# Patient Record
Sex: Female | Born: 1945
Health system: Southern US, Community
[De-identification: ages and names within clinical notes are randomized; demographics above are authoritative.]

## PROBLEM LIST (undated history)

## (undated) ENCOUNTER — Emergency Department (HOSPITAL_COMMUNITY): Discharge status: Absent without leave | Payer: PPO

## (undated) DIAGNOSIS — K759 Inflammatory liver disease, unspecified: Secondary | ICD-10-CM

## (undated) DIAGNOSIS — K219 Gastro-esophageal reflux disease without esophagitis: Secondary | ICD-10-CM

## (undated) DIAGNOSIS — M1990 Unspecified osteoarthritis, unspecified site: Secondary | ICD-10-CM

## (undated) DIAGNOSIS — R569 Unspecified convulsions: Secondary | ICD-10-CM

## (undated) DIAGNOSIS — F32A Depression, unspecified: Secondary | ICD-10-CM

## (undated) DIAGNOSIS — B1910 Unspecified viral hepatitis B without hepatic coma: Secondary | ICD-10-CM

## (undated) DIAGNOSIS — C801 Malignant (primary) neoplasm, unspecified: Secondary | ICD-10-CM

## (undated) DIAGNOSIS — F329 Major depressive disorder, single episode, unspecified: Secondary | ICD-10-CM

## (undated) HISTORY — DX: Unspecified osteoarthritis, unspecified site: M19.90

## (undated) HISTORY — DX: Unspecified viral hepatitis B without hepatic coma: B19.10

## (undated) HISTORY — PX: TUBAL LIGATION: SHX77

## (undated) HISTORY — PX: APPENDECTOMY: SHX54

## (undated) HISTORY — DX: Malignant (primary) neoplasm, unspecified: C80.1

---
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
## 1995-02-01 DIAGNOSIS — B1910 Unspecified viral hepatitis B without hepatic coma: Secondary | ICD-10-CM

## 1995-02-01 DIAGNOSIS — M1990 Unspecified osteoarthritis, unspecified site: Secondary | ICD-10-CM

## 1995-02-01 HISTORY — DX: Unspecified osteoarthritis, unspecified site: M19.90

## 1995-02-01 HISTORY — DX: Unspecified viral hepatitis B without hepatic coma: B19.10

## 1998-08-31 ENCOUNTER — Emergency Department (HOSPITAL_COMMUNITY): Admission: EM | Admit: 1998-08-31 | Discharge: 1998-08-31 | Payer: Self-pay | Admitting: Emergency Medicine

## 1998-09-11 ENCOUNTER — Encounter: Admission: RE | Admit: 1998-09-11 | Discharge: 1998-09-11 | Payer: Self-pay | Admitting: *Deleted

## 2001-07-16 ENCOUNTER — Emergency Department (HOSPITAL_COMMUNITY): Admission: EM | Admit: 2001-07-16 | Discharge: 2001-07-16 | Payer: Self-pay | Admitting: Emergency Medicine

## 2001-07-21 ENCOUNTER — Emergency Department (HOSPITAL_COMMUNITY): Admission: EM | Admit: 2001-07-21 | Discharge: 2001-07-21 | Payer: Self-pay | Admitting: Emergency Medicine

## 2002-01-13 ENCOUNTER — Inpatient Hospital Stay (HOSPITAL_COMMUNITY): Admission: EM | Admit: 2002-01-13 | Discharge: 2002-01-18 | Payer: Self-pay | Admitting: Psychiatry

## 2002-03-30 ENCOUNTER — Emergency Department (HOSPITAL_COMMUNITY): Admission: EM | Admit: 2002-03-30 | Discharge: 2002-03-30 | Payer: Self-pay

## 2003-01-15 ENCOUNTER — Ambulatory Visit (HOSPITAL_COMMUNITY): Admission: RE | Admit: 2003-01-15 | Discharge: 2003-01-15 | Payer: Self-pay

## 2004-02-16 ENCOUNTER — Ambulatory Visit: Payer: Self-pay | Admitting: Gastroenterology

## 2004-03-02 ENCOUNTER — Ambulatory Visit (HOSPITAL_COMMUNITY): Admission: RE | Admit: 2004-03-02 | Discharge: 2004-03-02 | Payer: Self-pay | Admitting: Gastroenterology

## 2004-10-22 ENCOUNTER — Ambulatory Visit: Payer: Self-pay | Admitting: Gastroenterology

## 2005-09-09 ENCOUNTER — Ambulatory Visit: Payer: Self-pay | Admitting: Gastroenterology

## 2006-07-27 ENCOUNTER — Ambulatory Visit: Payer: Self-pay | Admitting: Gastroenterology

## 2006-08-02 ENCOUNTER — Ambulatory Visit (HOSPITAL_COMMUNITY): Admission: RE | Admit: 2006-08-02 | Discharge: 2006-08-02 | Payer: Self-pay | Admitting: Emergency Medicine

## 2006-08-31 ENCOUNTER — Ambulatory Visit (HOSPITAL_COMMUNITY): Admission: RE | Admit: 2006-08-31 | Discharge: 2006-08-31 | Payer: Self-pay | Admitting: Gastroenterology

## 2006-08-31 ENCOUNTER — Encounter (INDEPENDENT_AMBULATORY_CARE_PROVIDER_SITE_OTHER): Payer: Self-pay | Admitting: Interventional Radiology

## 2007-01-11 ENCOUNTER — Ambulatory Visit: Payer: Self-pay | Admitting: Gastroenterology

## 2007-01-16 ENCOUNTER — Ambulatory Visit (HOSPITAL_COMMUNITY): Admission: RE | Admit: 2007-01-16 | Discharge: 2007-01-16 | Payer: Self-pay | Admitting: Gastroenterology

## 2007-07-26 ENCOUNTER — Ambulatory Visit: Payer: Self-pay | Admitting: Gastroenterology

## 2010-06-18 NOTE — Discharge Summary (Signed)
NAME:  Brooke Huber, Brooke Huber                     ACCOUNT NO.:  [id number]   MEDICAL RECORD NO.:  [id number]                   PATIENT TYPE:  IPS   LOCATION:  0406                                 FACILITY:  [facility]   PHYSICIAN:  Jeanice Lim, M.D.              DATE OF BIRTH:  13-May-1945   DATE OF ADMISSION:  01/13/2002  DATE OF DISCHARGE:  01/18/2002                                 DISCHARGE SUMMARY   IDENTIFYING DATA:  This is a 65 year old, divorced, Caucasian female  voluntarily admitted, presenting to the [facility], off her medications.  Having a history of schizophrenia, believing  that her husband was the devil and had been keeping a diary with references  to suicide.  Was hearing Jesus talking to her.  Psychotic at the time of  admission.   MEDICATIONS:  1. Prempro.  2. Calcium supplement.  3. Effexor XR 75 mg q. a.m.  4. BuSpar.  5. Likely Wellbutrin 100 mg q. a.m. and two tablets q. 12 p.m.  6. Haldol 1 mg q. h.s.   ALLERGIES:  No known drug allergies.   PHYSICAL EXAMINATION:  Essentially within normal limits.  Neurologically  nonfocal.   ROUTINE ADMISSION LABORATORIES:  WBC mildly elevated at 10.7, otherwise,  essentially within normal.  Low potassium at 3.2, liver enzymes within  normal limits, thyroid panel within normal limits.   MENTAL STATUS EXAM:  Female appearing stated age in no acute distress,  somewhat hyper, alert, guarded, suspicious.  Inappropriately dressed in  raincoat.  Clearly paranoid.  Speech was pressured.  Mood guarded.  Internally distracted, glancing around, appearing to respond to internal  stimulus.  Cognitively intact.  Judgment and insight poor.   ADMISSION DIAGNOSES:   AXIS I:  Schizophrenia not otherwise specified, acute exacerbation versus  paranoid type.   AXIS II:  None.   AXIS III:  1. Mild hypokalemia.  2. Distant history of questionable seizure disorder or spell.   AXIS IV:  Moderate.   AXIS  V:  14/65  Patient was admitted,  ordered routine care and medications, underwent  furthering monitoring, was encouraged to participate in individual, group  and milieu therapy.  Patient was started on Zyprexa Zydis, placed on seizure  precautions and Zyprexa was optimized.  Patient was treated for possible UTI  with Bactrim and Zyprexa was further optimized.  Aftercare planning and  family contact was completed and patient showed a positive response to  medication changes without side effects.   CONDITION ON DISCHARGE:  Markedly improved.  Thought processes more goal  directed and more reality based.  Mood is more euthymic.  Affect brighter.  Thought processes goal directed.  Thought content was less problem at  paranoia.  No hallucinations, no suicidal or homicidal ideations.  Judgment  and insight had improved and patient reported motivation to be compliant  with the aftercare plan.   DISCHARGE MEDICATIONS:  1. Bactrim b.i.d.  for three days.  2. Zoloft 50 mg q. a.m.  3. Zyprexa Zydis 15 mg q. h.s.  Patient was to follow up with [facility] on 12/31  at 2:30.  Patient to follow up at [facility] on   DISCHARGE DIAGNOSES:   AXIS I:  Schizophrenia not otherwise specified, acute exacerbation versus  paranoid type.   AXIS II:  None.   AXIS III:  1. Mild hypokalemia.  2. Distant history of questionable seizure disorder or spell.   AXIS IV:  Moderate.   AXIS V:  Global Assessment of Functioning on discharge was 55.                                               Jeanice Lim, M.D.    JEM/MEDQ  D:  01/21/2002  T:  01/21/2002  Job:  [id number]

## 2010-06-18 NOTE — H&P (Signed)
NAME:  Brooke Huber, Brooke Huber                     ACCOUNT NO.:  [id number]   MEDICAL RECORD NO.:  [id number]                   PATIENT TYPE:  IPS   LOCATION:  0406                                 FACILITY:  [facility]   PHYSICIAN:  Geoffery Lyons, M.D.                   DATE OF BIRTH:  1945/08/26   DATE OF ADMISSION:  01/13/2002  DATE OF DISCHARGE:                         PSYCHIATRIC ADMISSION ASSESSMENT   DATE OF ASSESSMENT:  January 13, 2002   PATIENT IDENTIFICATION:  This is a 65 year old divorced white female who is  a voluntary admission.   HISTORY OF PRESENT ILLNESS:  This patient presented alone to [facility].  Her family had apparently felt that she had gone off  her medications for some time and had a history of schizophrenia.  She had  been acting strangely, believing that her husband was the devil and had been  keeping a diary with references to suicide.  She also had been hearing Jesus  talking to her.  Today, she refuses any type of interview.  She answers a  few brief questions but will participate only minimally and is resistant to  interview.  A telephone call to the patient's home and to her next of kin  listed has been made but we have received no response.  The patient appears  to be responding to internal stimuli, appears to be distracted by auditory  hallucinations, and generally has disorganized thinking.   PAST PSYCHIATRIC HISTORY:  We have no history available for previous  hospitalizations.  She was seen and evaluated by [facility] prior to admission here.  According to their evaluation, the  patient has a history of prior hospitalizations at [facility] in  June 2003, [facility] this year in April 2003, and prior to that,  [facility] in 1994.  The patient apparently has been followed by Dr.  De Nurse.  This is her first admission to [facility].   SUBSTANCE  ABUSE HISTORY:  Alcohol and drug history is not available.  Apparently, there seems to be no history evident of any prior substance  abuse.   PAST MEDICAL HISTORY:  The patient's primary care Laurella Tull is unclear.  Medical problems include mild hypokalemia as diagnosed in the emergency room  with a potassium was 3.2.  The patient's past medical history is remarkable  for apparently seizures in 1992 and 1993 in the distant past with unclear  etiology.  She also has a history of bilateral tubal ligation and  tonsillectomy and adenoidectomy.   MEDICATIONS:  Past medications that she has been taking and we understand  she has been noncompliant with these, for how long is unclear.  1. Prempro 0.625 mg.  2. Calcium supplement 1250 p.o. t.i.d.  3. Effexor XR 75 mg p.o. q.d.  4. Buspirone 100 mg one tablet q.a.m. and two tablets at  noon.  5. Haldol 1 mg q.h.s.   REVIEW OF SYSTEMS:  Review of systems at the time of admission was  remarkable for the patient's complaint of some chronic constipation,  frequency of BMs is unclear.  Also, she has complained of some joint pain in  her hips and in her lower back and she reports that she is in postmenopausal  state for the past five years.  She reports sleeping anywhere from one to  five hours per night.   PHYSICAL EXAMINATION:  GENERAL:  We are unable to complete a physical  examination on this patient at this time due to her guarded manner and her  level of paranoia.  VITAL SIGNS:  On admission to the unit, her vital signs show a mild  elevation in her temperature at 99.9, pulse 101, blood pressure 143/86.  She  weighed 160 pounds and is 5 feet 3 inches tall.  She is a nonsmoker.   LABORATORY DATA:  Diagnostic studies reveal her CBC to show a mildly  elevated WBC at 10.7, hemoglobin 13.6, hematocrit 39.5, MCV 88.2, platelets  273,000.  Electrolytes were normal except for her potassium which was mildly  decreased at 3.2; BUN 12, creatinine 1.1.   Liver enzymes were within normal  limits.  Thyroid panel is currently pending as is her routine urinalysis and  urine drug screen.   SOCIAL HISTORY:  The patient reports that she is originally from [place].  She has lived in [place] for the past 33 years.  She has one grown  son and apparently, she appears to be employed, although she declined to  answer any additional questions or give additional details.   FAMILY HISTORY:  Family history is not available.   MENTAL STATUS EXAM:  This is a middle-aged female who appears to be her  stated age of 65 years old.  She is in no acute distress.  She is fully  alert, somewhat hyperalert, and has a guarded and suspicious affect.  She is  inappropriately dressed in a raincoat, huddled on a couch.  She answers a  few questions.  She declined to follow me to the consult room saying that  no, she did not want to talk to me at all.  Then she answered a few  questions if I keep my distance from her but is generally resistant to  interview.  Speech is pressured.  Mood is guarded and suspicious.  Thought  process: She demonstrates paranoia, actually appears to be internally  distracted, glancing around, and appears to be attentive on her inaudible  stimuli.  She is glancing over shoulder and looking around.  Cognitive:  Intact and oriented x 3.   ADMISSION DIAGNOSES:   AXIS I:  Schizophrenia, not otherwise specified by history, acute  exacerbation, paranoid type.   AXIS II:  Deferred.   AXIS III:  1. Mild hypokalemia.  2. Distant history of seizure disorder.   AXIS IV:  Deferred.   AXIS V:  Current 14, past year 54 estimated.   INITIAL PLAN OF CARE:  This is a voluntary admission for psychotic behavior  and paranoia with our goal to improve her safety, her insight, and improve  her reality testing.  We have initiated Zyprexa Zydis 5 mg q.h.s. and we will also be giving her that at noon and we have also provided for her to  have 2.5  mg Zyprexa q.2h. p.r.n. for agitation.  We are going to complete  her routine lab  exam.  We will also give her K-Dur 20 mEq for the next three  days, ask the nurses to push fluids and make sure she is getting enough to  eat and drink, and ask the case managers to continue to attempt to contact  her family.  We are going to hold her routine medications at this time since  she is considerably quite paranoid.  We are not going to restart Effexor or  attempt to try any of those and just try to concentrate and focus on getting  the Zyprexa Zydis into her.  We will add additional medications as we feel  we may  be able and she can tolerate them.  We do have seizure precautions in place  for her safety.  She is actually participating in activities on the close  observation unit.   ESTIMATED LENGTH OF STAY:  Six to seven days.     Brooke Huber, N.P.                   Geoffery Lyons, M.D.    MAS/MEDQ  D:  01/14/2002  T:  01/14/2002  Job:  [id number]

## 2010-11-15 LAB — PROTIME-INR
INR: 1
Prothrombin Time: 13.6

## 2010-11-15 LAB — CBC
HCT: 40.4
Hemoglobin: 13.6
MCHC: 33.6
MCV: 89
Platelets: 221
RBC: 4.54
RDW: 13.5
WBC: 5.9

## 2012-01-12 ENCOUNTER — Other Ambulatory Visit: Payer: Self-pay | Admitting: Gastroenterology

## 2012-01-12 DIAGNOSIS — B181 Chronic viral hepatitis B without delta-agent: Secondary | ICD-10-CM

## 2012-01-17 ENCOUNTER — Ambulatory Visit
Admission: RE | Admit: 2012-01-17 | Discharge: 2012-01-17 | Disposition: A | Discharge status: Home / Self Care | Payer: Medicare Other | Source: Ambulatory Visit | Attending: Gastroenterology | Admitting: Gastroenterology

## 2012-01-17 DIAGNOSIS — B181 Chronic viral hepatitis B without delta-agent: Secondary | ICD-10-CM

## 2013-01-17 ENCOUNTER — Other Ambulatory Visit: Payer: Self-pay | Admitting: Gastroenterology

## 2013-01-17 DIAGNOSIS — B181 Chronic viral hepatitis B without delta-agent: Secondary | ICD-10-CM

## 2013-01-25 ENCOUNTER — Ambulatory Visit
Admission: RE | Admit: 2013-01-25 | Discharge: 2013-01-25 | Disposition: A | Discharge status: Home / Self Care | Payer: Medicare Other | Source: Ambulatory Visit | Attending: Gastroenterology | Admitting: Gastroenterology

## 2013-01-25 DIAGNOSIS — B181 Chronic viral hepatitis B without delta-agent: Secondary | ICD-10-CM

## 2013-02-22 ENCOUNTER — Other Ambulatory Visit: Payer: Self-pay | Admitting: Family Medicine

## 2013-02-22 ENCOUNTER — Other Ambulatory Visit (HOSPITAL_COMMUNITY)
Admission: RE | Admit: 2013-02-22 | Discharge: 2013-02-22 | Disposition: A | Discharge status: Home / Self Care | Payer: Medicare Other | Source: Ambulatory Visit | Attending: Family Medicine | Admitting: Family Medicine

## 2013-02-22 DIAGNOSIS — Z124 Encounter for screening for malignant neoplasm of cervix: Secondary | ICD-10-CM | POA: Insufficient documentation

## 2013-08-15 ENCOUNTER — Other Ambulatory Visit: Payer: Self-pay | Admitting: Dermatology

## 2013-08-22 ENCOUNTER — Other Ambulatory Visit: Payer: Self-pay | Admitting: Gastroenterology

## 2013-08-22 DIAGNOSIS — B181 Chronic viral hepatitis B without delta-agent: Secondary | ICD-10-CM

## 2013-08-30 ENCOUNTER — Ambulatory Visit
Admission: RE | Admit: 2013-08-30 | Discharge: 2013-08-30 | Disposition: A | Discharge status: Home / Self Care | Payer: Commercial Managed Care - HMO | Source: Ambulatory Visit | Attending: Gastroenterology | Admitting: Gastroenterology

## 2013-08-30 ENCOUNTER — Other Ambulatory Visit: Payer: Medicare Other

## 2013-08-30 DIAGNOSIS — B181 Chronic viral hepatitis B without delta-agent: Secondary | ICD-10-CM

## 2014-02-28 ENCOUNTER — Other Ambulatory Visit: Payer: Self-pay | Admitting: Gastroenterology

## 2014-02-28 DIAGNOSIS — B181 Chronic viral hepatitis B without delta-agent: Secondary | ICD-10-CM

## 2014-03-06 ENCOUNTER — Ambulatory Visit
Admission: RE | Admit: 2014-03-06 | Discharge: 2014-03-06 | Disposition: A | Discharge status: Home / Self Care | Payer: PPO | Source: Ambulatory Visit | Attending: Gastroenterology | Admitting: Gastroenterology

## 2014-03-06 DIAGNOSIS — B181 Chronic viral hepatitis B without delta-agent: Secondary | ICD-10-CM

## 2014-06-08 ENCOUNTER — Encounter (HOSPITAL_COMMUNITY): Payer: Self-pay | Admitting: Emergency Medicine

## 2014-06-08 ENCOUNTER — Emergency Department (HOSPITAL_COMMUNITY)
Admission: EM | Admit: 2014-06-08 | Discharge: 2014-06-08 | Disposition: A | Discharge status: Home / Self Care | Payer: PPO | Attending: Emergency Medicine | Admitting: Emergency Medicine

## 2014-06-08 ENCOUNTER — Ambulatory Visit (HOSPITAL_COMMUNITY)
Admission: AD | Admit: 2014-06-08 | Discharge: 2014-06-08 | Disposition: A | Discharge status: Home / Self Care | Payer: PPO | Attending: Psychiatry | Admitting: Psychiatry

## 2014-06-08 ENCOUNTER — Encounter (HOSPITAL_COMMUNITY): Payer: Self-pay

## 2014-06-08 ENCOUNTER — Emergency Department (HOSPITAL_COMMUNITY): Payer: PPO

## 2014-06-08 ENCOUNTER — Inpatient Hospital Stay (HOSPITAL_COMMUNITY)
Admission: AD | Admit: 2014-06-08 | Discharge: 2014-06-13 | DRG: 885 | Disposition: A | Discharge status: Home / Self Care | Payer: PPO | Source: Intra-hospital | Attending: Psychiatry | Admitting: Psychiatry

## 2014-06-08 DIAGNOSIS — E876 Hypokalemia: Secondary | ICD-10-CM | POA: Diagnosis present

## 2014-06-08 DIAGNOSIS — F39 Unspecified mood [affective] disorder: Secondary | ICD-10-CM | POA: Diagnosis not present

## 2014-06-08 DIAGNOSIS — Z880 Allergy status to penicillin: Secondary | ICD-10-CM | POA: Diagnosis not present

## 2014-06-08 DIAGNOSIS — F410 Panic disorder [episodic paroxysmal anxiety] without agoraphobia: Secondary | ICD-10-CM | POA: Diagnosis present

## 2014-06-08 DIAGNOSIS — F333 Major depressive disorder, recurrent, severe with psychotic symptoms: Secondary | ICD-10-CM | POA: Diagnosis present

## 2014-06-08 DIAGNOSIS — R5383 Other fatigue: Secondary | ICD-10-CM | POA: Insufficient documentation

## 2014-06-08 DIAGNOSIS — F4310 Post-traumatic stress disorder, unspecified: Secondary | ICD-10-CM | POA: Diagnosis present

## 2014-06-08 DIAGNOSIS — F339 Major depressive disorder, recurrent, unspecified: Secondary | ICD-10-CM | POA: Diagnosis present

## 2014-06-08 DIAGNOSIS — F332 Major depressive disorder, recurrent severe without psychotic features: Secondary | ICD-10-CM | POA: Diagnosis present

## 2014-06-08 DIAGNOSIS — Z634 Disappearance and death of family member: Secondary | ICD-10-CM

## 2014-06-08 DIAGNOSIS — F419 Anxiety disorder, unspecified: Secondary | ICD-10-CM | POA: Diagnosis present

## 2014-06-08 HISTORY — DX: Major depressive disorder, single episode, unspecified: F32.9

## 2014-06-08 HISTORY — DX: Inflammatory liver disease, unspecified: K75.9

## 2014-06-08 HISTORY — DX: Depression, unspecified: F32.A

## 2014-06-08 LAB — URINALYSIS, ROUTINE W REFLEX MICROSCOPIC
Glucose, UA: NEGATIVE mg/dL
Hgb urine dipstick: NEGATIVE
Ketones, ur: NEGATIVE mg/dL
Nitrite: NEGATIVE
Protein, ur: NEGATIVE mg/dL
Specific Gravity, Urine: 1.024 (ref 1.005–1.030)
Urobilinogen, UA: 1 mg/dL (ref 0.0–1.0)
pH: 5.5 (ref 5.0–8.0)

## 2014-06-08 LAB — CBC WITH DIFFERENTIAL/PLATELET
Basophils Absolute: 0.1 10*3/uL (ref 0.0–0.1)
Basophils Relative: 1 % (ref 0–1)
Eosinophils Absolute: 0 10*3/uL (ref 0.0–0.7)
Eosinophils Relative: 0 % (ref 0–5)
HCT: 42.8 % (ref 36.0–46.0)
Hemoglobin: 14.5 g/dL (ref 12.0–15.0)
Lymphocytes Relative: 19 % (ref 12–46)
Lymphs Abs: 2 10*3/uL (ref 0.7–4.0)
MCH: 30 pg (ref 26.0–34.0)
MCHC: 33.9 g/dL (ref 30.0–36.0)
MCV: 88.6 fL (ref 78.0–100.0)
Monocytes Absolute: 1.1 10*3/uL — ABNORMAL HIGH (ref 0.1–1.0)
Monocytes Relative: 10 % (ref 3–12)
Neutro Abs: 7.6 10*3/uL (ref 1.7–7.7)
Neutrophils Relative %: 70 % (ref 43–77)
Platelets: 254 10*3/uL (ref 150–400)
RBC: 4.83 MIL/uL (ref 3.87–5.11)
RDW: 12.7 % (ref 11.5–15.5)
WBC: 10.8 10*3/uL — ABNORMAL HIGH (ref 4.0–10.5)

## 2014-06-08 LAB — RAPID URINE DRUG SCREEN, HOSP PERFORMED
Amphetamines: NOT DETECTED
Barbiturates: NOT DETECTED
Benzodiazepines: NOT DETECTED
Cocaine: NOT DETECTED
Opiates: NOT DETECTED
Tetrahydrocannabinol: NOT DETECTED

## 2014-06-08 LAB — COMPREHENSIVE METABOLIC PANEL
ALT: 16 U/L (ref 14–54)
AST: 24 U/L (ref 15–41)
Albumin: 4.2 g/dL (ref 3.5–5.0)
Alkaline Phosphatase: 71 U/L (ref 38–126)
Anion gap: 11 (ref 5–15)
BUN: 18 mg/dL (ref 6–20)
CO2: 27 mmol/L (ref 22–32)
Calcium: 9 mg/dL (ref 8.9–10.3)
Chloride: 102 mmol/L (ref 101–111)
Creatinine, Ser: 1.09 mg/dL — ABNORMAL HIGH (ref 0.44–1.00)
GFR calc Af Amer: 59 mL/min — ABNORMAL LOW (ref >60)
GFR calc non Af Amer: 51 mL/min — ABNORMAL LOW (ref >60)
Glucose, Bld: 114 mg/dL — ABNORMAL HIGH (ref 70–99)
Potassium: 3.1 mmol/L — ABNORMAL LOW (ref 3.5–5.1)
Sodium: 140 mmol/L (ref 135–145)
Total Bilirubin: 1.6 mg/dL — ABNORMAL HIGH (ref 0.3–1.2)
Total Protein: 7.3 g/dL (ref 6.5–8.1)

## 2014-06-08 LAB — URINE MICROSCOPIC-ADD ON

## 2014-06-08 LAB — ACETAMINOPHEN LEVEL: Acetaminophen (Tylenol), Serum: <10 ug/mL — ABNORMAL LOW (ref 10–30)

## 2014-06-08 LAB — SALICYLATE LEVEL: Salicylate Lvl: <4 mg/dL (ref 2.8–30.0)

## 2014-06-08 LAB — ETHANOL: Alcohol, Ethyl (B): <5 mg/dL (ref <5)

## 2014-06-08 MED ORDER — MAGNESIUM HYDROXIDE 400 MG/5ML PO SUSP: 30.0000 mL | Freq: Every day | ORAL | Status: DC | PRN

## 2014-06-08 MED ORDER — ACETAMINOPHEN 325 MG PO TABS: 650.0000 mg | ORAL_TABLET | ORAL | Status: DC | PRN

## 2014-06-08 MED ORDER — ALUM & MAG HYDROXIDE-SIMETH 200-200-20 MG/5ML PO SUSP: 30.0000 mL | ORAL | Status: DC | PRN

## 2014-06-08 MED ORDER — HYDROXYZINE HCL 25 MG PO TABS
25.0000 mg | ORAL_TABLET | Freq: Three times a day (TID) | ORAL | Status: DC | PRN
  Administered 2014-06-09 (×3): 25 mg via ORAL
  Filled 2014-06-08 (×3): qty 1

## 2014-06-08 MED ORDER — SERTRALINE HCL 50 MG PO TABS
50.0000 mg | ORAL_TABLET | Freq: Every day | ORAL | Status: DC
Start: 2014-06-08 — End: 2014-06-08
  Administered 2014-06-08: 50 mg via ORAL
  Filled 2014-06-08: qty 1

## 2014-06-08 MED ORDER — ACETAMINOPHEN 325 MG PO TABS: 650.0000 mg | ORAL_TABLET | Freq: Four times a day (QID) | ORAL | Status: DC | PRN

## 2014-06-08 MED ORDER — POTASSIUM CHLORIDE CRYS ER 20 MEQ PO TBCR
40.0000 meq | EXTENDED_RELEASE_TABLET | Freq: Once | ORAL | Status: AC
  Administered 2014-06-08: 40 meq via ORAL
  Filled 2014-06-08: qty 2
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
## 2014-06-08 NOTE — Progress Notes (Signed)
LCSW was asked by [facility] to speak with patient about signing in voluntarily. LCSW went to speak with patient and patient stated that she was going home. LCSW contacted [facility] to relay this information. LCSW went back to speak with the patient and asked if  She would be willing to read over the information to be treated voluntarily and patient reports that she would like to read over it but she would need her glasses. Patient reports that her son has her purse and her glasses and he could be reached at [phone]. LCSW contacted the patients son, Tagen Brethauer and he states that he will bring the patients glasses, however, his plan is not to leave with her. Her son reports "she needs help, but I will drop her glasses off." When asked if he intended to pick the patient up, her son reported "no" LCSW will inform the charge nurse and review the paperwork with the patient once she has her glasses.

## 2014-06-08 NOTE — ED Provider Notes (Signed)
CSN: [id number]     Arrival date & time 06/08/14  1534 History   First MD Initiated Contact with Patient 06/08/14 1540     Chief Complaint  Patient presents with  . Medical Clearance  . Anxiety     (Consider location/radiation/quality/duration/timing/severity/associated sxs/prior Treatment) HPI  69 year old female presents from [facility] for medical clearance. The patient was brought there by family for racing thoughts and depression. The patient is tangential but for the most part seems to indicate that the symptoms started within the last few weeks after of close friend of hers died suddenly. The patient denies suicidal or homicidal thoughts. She's been sleeping poorly and seems to have a lot of thoughts in her head. These thoughts come from her own voices, no external stimuli or visual hallucinations. The patient is vague about prior psychiatric illnesses. She was recently started on sertraline by her PCP. She's not sure how long she's been taking this for. The patient denies any headache or focal weakness. The patient feels overall fatigued but no focal complaints such as chest pain, shortness of breath, abdominal pain, vomiting, or urinary symptoms. Sent by behavioral health to rule out UTI or electrolyte abnormalities that can cause similar symptoms. No current family at the bedside.  History reviewed. No pertinent past medical history. History reviewed. No pertinent past surgical history. History reviewed. No pertinent family history. History  Substance Use Topics  . Smoking status: Never Smoker   . Smokeless tobacco: Not on file  . Alcohol Use: No   OB History    No data available     Review of Systems  Constitutional: Positive for fatigue. Negative for fever.  Respiratory: Negative for shortness of breath.   Cardiovascular: Negative for chest pain.  Gastrointestinal: Negative for vomiting and abdominal pain.  Neurological: Negative for weakness and headaches.   Psychiatric/Behavioral: Positive for sleep disturbance and dysphoric mood. Negative for suicidal ideas, hallucinations and self-injury. The patient is nervous/anxious.   All other systems reviewed and are negative.     Allergies  Penicillins  Home Medications   Prior to Admission medications   Not on File   BP 139/76 mmHg  Pulse 72  Temp(Src) 98.5 F (36.9 C) (Oral)  Resp 18  SpO2 100% Physical Exam  Constitutional: She is oriented to person, place, and time. She appears well-developed and well-nourished.  HENT:  Head: Normocephalic and atraumatic.  Right Ear: External ear normal.  Left Ear: External ear normal.  Nose: Nose normal.  Eyes: Right eye exhibits no discharge. Left eye exhibits no discharge.  Cardiovascular: Normal rate, regular rhythm and normal heart sounds.   Pulmonary/Chest: Effort normal and breath sounds normal.  Abdominal: Soft. There is no tenderness.  Neurological: She is alert and oriented to person, place, and time.  She is alert and oriented to self, time, place and situation.  Skin: Skin is warm and dry.  Psychiatric: Her speech is delayed and tangential. She is not agitated and not aggressive. Thought content is not paranoid. She exhibits a depressed mood. She expresses no homicidal and no suicidal ideation.  Nursing note and vitals reviewed.   ED Course  Procedures (including critical care time) Labs Review Labs Reviewed  COMPREHENSIVE METABOLIC PANEL - Abnormal; Notable for the following:    Potassium 3.1 (*)    Glucose, Bld 114 (*)    Creatinine, Ser 1.09 (*)    Total Bilirubin 1.6 (*)    GFR calc non Af Amer 51 (*)    GFR calc Af  Amer 81 (*)    All other components within normal limits  ACETAMINOPHEN LEVEL - Abnormal; Notable for the following:    Acetaminophen (Tylenol), Serum <10 (*)    All other components within normal limits  URINALYSIS, ROUTINE W REFLEX MICROSCOPIC - Abnormal; Notable for the following:    Color, Urine AMBER  (*)    APPearance CLOUDY (*)    Bilirubin Urine SMALL (*)    Leukocytes, UA TRACE (*)    All other components within normal limits  CBC WITH DIFFERENTIAL/PLATELET - Abnormal; Notable for the following:    WBC 10.8 (*)    Monocytes Absolute 1.1 (*)    All other components within normal limits  URINE MICROSCOPIC-ADD ON - Abnormal; Notable for the following:    Squamous Epithelial / LPF FEW (*)    All other components within normal limits  URINE CULTURE  ETHANOL  SALICYLATE LEVEL  URINE RAPID DRUG SCREEN (HOSP PERFORMED)    Imaging Review Ct Head Wo Contrast  06/08/2014   CLINICAL DATA:  Mental status changes.  Delusional thinking.  EXAM: CT HEAD WITHOUT CONTRAST  TECHNIQUE: Contiguous axial images were obtained from the base of the skull through the vertex without intravenous contrast.  COMPARISON:  None.  FINDINGS: The brain has a normal appearance without evidence of atrophy, infarction, mass lesion, hemorrhage, hydrocephalus or extra-axial collection. The calvarium is unremarkable. The paranasal sinuses, middle ears and mastoids are clear.  IMPRESSION: Normal head CT   Electronically Signed   By: Nelson Chimes M.D.   On: 06/08/2014 17:55     EKG Interpretation None      MDM   Final diagnoses:  Mood disorder    Patient has been medically cleared. No evidence of UTI, will send urine for urine culture. No focal abnormalities on exam besides the psychiatric exam as above. Patient has been accepted to and has a bed at behavioral health.     Sherwood Gambler, MD 06/08/14 [phone]

## 2014-06-08 NOTE — Tx Team (Signed)
Initial Interdisciplinary Treatment Plan   PATIENT STRESSORS: Loss of close friend Medication change or noncompliance   PATIENT STRENGTHS: Ability for insight Average or above average intelligence Communication skills General fund of knowledge Supportive family/friends   PROBLEM LIST: Problem List/Patient Goals Date to be addressed Date deferred Reason deferred Estimated date of resolution  Risk for Suicide 06/08/14     Loss of close friend 06/08/14     Depression 06/08/14     Grief 06/08/14     "getting better" 06/08/14                              DISCHARGE CRITERIA:  Improved stabilization in mood, thinking, and/or behavior Verbal commitment to aftercare and medication compliance  PRELIMINARY DISCHARGE PLAN: Attend aftercare/continuing care group Outpatient therapy  PATIENT/FAMIILY INVOLVEMENT: This treatment plan has been presented to and reviewed with the patient, [name].  The patient and family have been given the opportunity to ask questions and make suggestions.  Vonzella Nipple A 06/08/2014, 11:31 PM

## 2014-06-08 NOTE — Progress Notes (Signed)
LCSW informed Autumn, RN that she has the paperwork to be signed and can be reached at [phone] when the patients sone arrives with her glasses to review and sign the paperwork.
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
## 2014-06-08 NOTE — BH Assessment (Signed)
Assessment Note  Brooke Huber is an 69 y.o. female, the Patient was brought to [facility] walk-in by Dema Severin.  The Patient presented orientated x3, mood "sad", affect tearful, confused, and slightly disorientated.  Patient denied SI, HI, AH, and VH.  Patient  reports experiencing racing thoughts and feeling "sad."  Patient denied any substance use including alcohol.  Patient reports she sees Dr. Alroy Dust at [facility] who prescribes Sertraline.  Patient reports being unsure if she is taking medication as prescribed.  She reports medication helps with anxiety.  Patient began recalling an incidence when her Father was killed 57 years ago and reports deaths of other family members including a Daughter-in-Law and [place] who died many years ago.  [name] reports bringing her to [facility] after being instructed by an on-call Doctor at [facility].  He reports the Patient began acting strangely this morning talking about things that occurred years ago.  He reports the Patient appeared "confused."  [name] reports she may have been admitted to [facility] years ago.  He was unsure of the reason for the admission.  The [name], Wm Fruchter, reports he will transport the Patient to [facility] for medical clearance.           Axis I: Mood Disorder NOS Axis II: Deferred Axis IV: other psychosocial or environmental problems Axis V: 41-50 serious symptoms  Past Medical History: No past medical history on file.  No past surgical history on file.  Family History: No family history on file.  Social History:  has no tobacco, alcohol, and drug history on file.  Additional Social History:     CIWA:   COWS:    Allergies: Not on File  Home Medications:  (Not in a hospital admission)  OB/GYN Status:  No LMP recorded.  General Assessment Data Location of Assessment: [facility] Assessment Services TTS Assessment: In system Is this a Tele or Face-to-Face Assessment?: Face-to-Face Is this  an Initial Assessment or a Re-assessment for this encounter?: Initial Assessment Marital status: Divorced Wineglass name: Unknown Is patient pregnant?: No Pregnancy Status: No Living Arrangements: Children (Son, Daughter-in-Law, and 2 Waves live in [place]) Can pt return to current living arrangement?: Yes Admission Status: Voluntary Is patient capable of signing voluntary admission?: Yes Referral Source: Self/Family/Friend Insurance type: Health Team Advantage  Medical Screening Exam ([place]) Medical Exam completed: No Reason for MSE not completed: Other: (Patient being sent for medical clearance)  Crisis Care Plan Living Arrangements: Children (Son, Daughter-in-Law, and 2 [place] live in [place]) Name of Psychiatrist: None Name of Therapist: None  Education Status Is patient currently in school?: No Current Grade: N/A Highest grade of school patient has completed: N/A Name of school: N/A Contact person: N/A  Risk to self with the past 6 months Suicidal Ideation: No Has patient been a risk to self within the past 6 months prior to admission? : No Suicidal Intent: No Has patient had any suicidal intent within the past 6 months prior to admission? : No Is patient at risk for suicide?: No Suicidal Plan?: No Has patient had any suicidal plan within the past 6 months prior to admission? : No Access to Means: No What has been your use of drugs/alcohol within the last 12 months?: None Previous Attempts/Gestures: No How many times?: 0 Other Self Harm Risks: No Triggers for Past Attempts: None known Intentional Self Injurious Behavior: None Family Suicide History: Unknown Recent stressful life event(s): Other (Comment) (Unknown)  Persecutory voices/beliefs?: No Depression: Yes Depression Symptoms: Tearfulness Substance abuse history and/or treatment for substance abuse?: No Suicide prevention information given to non-admitted patients: Not  applicable  Risk to Others within the past 6 months Homicidal Ideation: No Does patient have any lifetime risk of violence toward others beyond the six months prior to admission? : No Thoughts of Harm to Others: No Current Homicidal Intent: No Current Homicidal Plan: No Access to Homicidal Means: No Identified Victim: N/A History of harm to others?: No Assessment of Violence: None Noted Violent Behavior Description: N/A Does patient have access to weapons?: No Criminal Charges Pending?: No Does patient have a court date: No Is patient on probation?: No  Psychosis Hallucinations: None noted Delusions: None noted  Mental Status Report Appearance/Hygiene: Unremarkable Eye Contact: Fair Motor Activity: Restlessness Speech: Logical/coherent Level of Consciousness: Alert Mood: Depressed, Anxious Affect: Depressed, Anxious Anxiety Level: Moderate Thought Processes: Coherent, Relevant Judgement: Impaired Orientation: Person, Place, Time Obsessive Compulsive Thoughts/Behaviors: None  Cognitive Functioning Concentration: Fair Memory: Recent Intact, Remote Impaired IQ: Average Insight: Fair Impulse Control: Fair Appetite: Poor Weight Loss: 0 Weight Gain: 0 Sleep: Decreased Total Hours of Sleep: 6 Vegetative Symptoms: None  ADLScreening [facility] Assessment Services) [name] cognitive ability adequate to safely complete daily activities?: Yes Patient able to express need for assistance with ADLs?: No Independently performs ADLs?: Yes (appropriate for developmental age)  Prior Inpatient Therapy Prior Inpatient Therapy: No (Son reports possibly) Prior Therapy Dates: Unsure Prior Therapy Facilty/Provider(s): [facility] Reason for Treatment: Son was unsure  Prior Outpatient Therapy Prior Outpatient Therapy: No Prior Therapy Dates: N/A Prior Therapy Facilty/Provider(s): N/A Reason for Treatment: N/A Does patient have an ACCT team?: No Does patient have Intensive In-House  Services?  : No Does patient have Monarch services? : No Does patient have P4CC services?: No  ADL Screening (condition at time of admission) [name] cognitive ability adequate to safely complete daily activities?: Yes Is the patient deaf or have difficulty hearing?: No Does the patient have difficulty seeing, even when wearing glasses/contacts?: No Does the patient have difficulty concentrating, remembering, or making decisions?: Yes Patient able to express need for assistance with ADLs?: No Does the patient have difficulty dressing or bathing?: No Independently performs ADLs?: Yes (appropriate for developmental age) Does the patient have difficulty walking or climbing stairs?: No Weakness of Legs: None Weakness of Arms/Hands: None  Home Assistive Devices/Equipment Home Assistive Devices/Equipment: None               Additional Information 1:1 In Past 12 Months?: No CIRT Risk: No Elopement Risk: No Does patient have medical clearance?: No (Patient being sent for medical clearance)     Disposition:  Disposition Initial Assessment Completed for this Encounter: Yes Disposition of Patient: Other dispositions (Patient will be checked for UTI and sodium level first) Other disposition(s): Other (Comment) (Pending lab results)  On Site Evaluation by:   Reviewed with Physician:    Dey-Johnson,Kylin Genna 06/08/2014 3:46 PM

## 2014-06-09 ENCOUNTER — Encounter (HOSPITAL_COMMUNITY): Payer: Self-pay | Admitting: Psychiatry

## 2014-06-09 DIAGNOSIS — F410 Panic disorder [episodic paroxysmal anxiety] without agoraphobia: Secondary | ICD-10-CM

## 2014-06-09 DIAGNOSIS — F4310 Post-traumatic stress disorder, unspecified: Secondary | ICD-10-CM

## 2014-06-09 DIAGNOSIS — E876 Hypokalemia: Secondary | ICD-10-CM | POA: Diagnosis present

## 2014-06-09 DIAGNOSIS — Z634 Disappearance and death of family member: Secondary | ICD-10-CM

## 2014-06-09 DIAGNOSIS — F333 Major depressive disorder, recurrent, severe with psychotic symptoms: Principal | ICD-10-CM | POA: Diagnosis present

## 2014-06-09 LAB — URINE CULTURE: Colony Count: 70000

## 2014-06-09 LAB — TSH: TSH: 2.117 u[IU]/mL (ref 0.350–4.500)

## 2014-06-09 MED ORDER — OLANZAPINE 5 MG PO TBDP
5.0000 mg | ORAL_TABLET | Freq: Three times a day (TID) | ORAL | Status: DC | PRN
  Filled 2014-06-09: qty 1

## 2014-06-09 MED ORDER — HYDROXYZINE HCL 25 MG PO TABS
25.0000 mg | ORAL_TABLET | Freq: Once | ORAL | Status: AC
  Administered 2014-06-09: 25 mg via ORAL
  Filled 2014-06-09 (×2): qty 1

## 2014-06-09 MED ORDER — ARIPIPRAZOLE 2 MG PO TABS
2.0000 mg | ORAL_TABLET | Freq: Every day | ORAL | Status: DC
  Administered 2014-06-09: 2 mg via ORAL
  Filled 2014-06-09 (×2): qty 1

## 2014-06-09 MED ORDER — FLUOXETINE HCL 10 MG PO CAPS: 10.0000 mg | ORAL_CAPSULE | Freq: Every day | ORAL | Status: DC

## 2014-06-09 MED ORDER — FLUOXETINE HCL 20 MG PO CAPS
20.0000 mg | ORAL_CAPSULE | Freq: Every day | ORAL | Status: DC
  Administered 2014-06-09 – 2014-06-10 (×2): 20 mg via ORAL
  Filled 2014-06-09 (×3): qty 1

## 2014-06-09 MED ORDER — LORAZEPAM 1 MG PO TABS
1.0000 mg | ORAL_TABLET | Freq: Three times a day (TID) | ORAL | Status: DC | PRN
  Administered 2014-06-10: 1 mg via ORAL
  Filled 2014-06-09: qty 1

## 2014-06-09 NOTE — Plan of Care (Signed)
Problem: Diagnosis: Increased Risk For Suicide Attempt Goal: STG-Patient Will Comply With Medication Regime Outcome: Progressing Patient is compliant with medication regime.     

## 2014-06-09 NOTE — Progress Notes (Signed)
Pt up yelling, praying, religiously pre-occupied at times. Pt stated "I don't want to be a hippocrates" . Pt pacing hall at times.

## 2014-06-09 NOTE — Progress Notes (Signed)
Pt continues to appear confused at times , pt looks as if she is trying to figure out where she is.
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
## 2014-06-09 NOTE — BHH Suicide Risk Assessment (Signed)
[facility] INPATIENT:  Family/Significant Other Suicide Prevention Education  Suicide Prevention Education:  Education Completed; son Ameka Krigbaum [phone] ,  (name of family member/significant other) has been identified by the patient as the family member/significant other with whom the patient will be residing, and identified as the person(s) who will aid the patient in the event of a mental health crisis (suicidal ideations/suicide attempt).  With written consent from the patient, the family member/significant other has been provided the following suicide prevention education, prior to the and/or following the discharge of the patient.  The suicide prevention education provided includes the following:  Suicide risk factors  Suicide prevention and interventions  National Suicide Hotline telephone number  [facility] assessment telephone number  [facility] and/or Residential Mobile Crisis Unit telephone number  Request made of family/significant other to:  Remove weapons (e.g., guns, rifles, knives), all items previously/currently identified as safety concern.    Remove drugs/medications (over-the-counter, prescriptions, illicit drugs), all items previously/currently identified as a safety concern.  The family member/significant other verbalizes understanding of the suicide prevention education information provided.  The family member/significant other agrees to remove the items of safety concern listed above.  Brooke Huber, Brooke Huber 06/09/2014, 4:10 PM

## 2014-06-09 NOTE — BHH Group Notes (Signed)
[facility] LCSW Aftercare Discharge Planning Group Note   06/09/2014 11:16 AM  Participation Quality:  Wandered in and out of group room a couple of times.  Did not stay.    Roque Lias B

## 2014-06-09 NOTE — BHH Suicide Risk Assessment (Signed)
[facility] Admission Suicide Risk Assessment   Nursing information obtained from:    Demographic factors:    Current Mental Status:    Loss Factors:    Historical Factors:    Risk Reduction Factors:    Total Time spent with patient: 30 minutes Principal Problem: MDD (major depressive disorder), recurrent, severe, with psychosis Diagnosis:   Patient Active Problem List   Diagnosis Date Noted  . MDD (major depressive disorder), recurrent, severe, with psychosis [F33.3] 06/09/2014  . PTSD (post-traumatic stress disorder) [F43.10] 06/09/2014  . Panic disorder [F41.0] 06/09/2014  . Bereavement [Z63.4] 06/09/2014     Continued Clinical Symptoms:  Alcohol Use Disorder Identification Test Final Score (AUDIT): 0 The "Alcohol Use Disorders Identification Test", Guidelines for Use in Primary Care, Second Edition.  World Pharmacologist [facility]). Score between 0-7:  no or low risk or alcohol related problems. Score between 8-15:  moderate risk of alcohol related problems. Score between 16-19:  high risk of alcohol related problems. Score 20 or above:  warrants further diagnostic evaluation for alcohol dependence and treatment.   CLINICAL FACTORS:   Depression:   Aggression Hopelessness Impulsivity Insomnia   Musculoskeletal: Strength & Muscle Tone: within normal limits Gait & Station: normal Patient leans: N/A  Psychiatric Specialty Exam: Physical Exam  ROS  Blood pressure 118/67, pulse 92, temperature 98.3 F (36.8 C), temperature source Oral, resp. rate 20, height 5\' 3"  (1.6 m), weight 83.462 kg (184 lb), SpO2 97 %.Body mass index is 32.6 kg/(m^2).  Please see H&P.   COGNITIVE FEATURES THAT CONTRIBUTE TO RISK:  Polarized thinking and Thought constriction (tunnel vision)    SUICIDE RISK:   Moderate:  Frequent suicidal ideation with limited intensity, and duration, some specificity in terms of plans, no associated intent, good self-control, limited dysphoria/symptomatology, some  risk factors present, and identifiable protective factors, including available and accessible social support.  PLAN OF CARE: Please see H&P.   Medical Decision Making:  Review of Psycho-Social Stressors (1), Review or order clinical lab tests (1), Review and summation of old records (2), Established Problem, Worsening (2), Review of Last Therapy Session (1), Review of Medication Regimen & Side Effects (2) and Review of New Medication or Change in Dosage (2)  I certify that inpatient services furnished can reasonably be expected to improve the patient's condition.   Leevi Cullars md 06/09/2014, 3:41 PM
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
## 2014-06-09 NOTE — Progress Notes (Signed)
Patient ID: Brooke Huber, female   DOB: May 16, 1945, 70 y.o.   MRN: [id number]  DAR: Pt. Denies SI/HI and visual Hallucinations. Patient did report to Probation officer that she was hearing voices but she could not make out what they are saying. Patient is religiously preoccupied, "I am trying to follow the 10 commandments but I'm just a blubbering asshole." Writer spent time 1:1 with patient practicing deep breathing because patient was seen running down the hall and then heard in her room yelling and crying. Patient was able to demonstrate. Patient is oriented X 3 but disoriented to time. Patient keeps asking when mother's day is and says she does not have her glasses although they are on the table beside her. Patient does not report any pain or discomfort at this time. Support and encouragement provided to the patient. Scheduled Prozac was administered to patient per physician's orders. Patient is cooperative but needs constant redirection. [name] mood is labile in the sense that she is crying one moment and smiling the next. Patient appears frightened at times. Patient is seen wandering in the milieu and does go to groups but leaves early or walks in and out of them. Q15 minute checks are maintained for safety.

## 2014-06-09 NOTE — Progress Notes (Signed)
D: Pt denies SI/HI/VH, PT+ve AH- contracts for safety. Pt is pleasant and cooperative. Pt does not appear as paranoid and appears  less manic. Pt appears less confused and seen walking the unit at times. Pt became very teary eyed "I'm not going to see you tomorrow". Pt was informed that writer was scheduled to work tomorrow, pt affect changed from sad/ worried to happy/ calm. Pt appears to be fixated on writer for some reason, like a point of reference or comfort. Pt very labile, but redirectable.  A: Pt was offered support and encouragement. Pt was given scheduled medications. Pt was encourage to attend groups. Q 15 minute checks were done for safety.   R:Pt attends groups and interacts well with peers and staff. Pt is taking medication. Pt has no complaints at this time .Pt receptive to treatment and safety maintained on unit.
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
## 2014-06-09 NOTE — Progress Notes (Signed)
Pt continues to come to the nursing station asking if it was 8 am yet. Tried to get medications to help pt , only Vistaril.

## 2014-06-09 NOTE — Plan of Care (Signed)
Problem: Ineffective individual coping Goal: STG: Patient will remain free from self harm Outcome: Progressing Pt safe on the unit  Problem: Alteration in mood Goal: LTG-Patient reports reduction in suicidal thoughts (Patient reports reduction in suicidal thoughts and is able to verbalize a safety plan for whenever patient is feeling suicidal)  Outcome: Progressing Pt denies SI at this time  Goal: STG-Patient is able to discuss feelings and issues (Patient is able to discuss feelings and issues leading to depression)  Outcome: Progressing Pt stated she was going to start doing things for herself, "time for me"

## 2014-06-09 NOTE — Progress Notes (Signed)
Pt needs constant re-direction when pt gets out of the bed, pt has crying spells, pt very emotional, pt appears to not know where she is, but when she sees Probation officer she appears more comfortable.

## 2014-06-09 NOTE — Progress Notes (Signed)
Pt woke up from sleeping in the quiet room , walked into the hallway as if she was looking for something. Pt was questioned about getting something for anxiety, and pt presented with thought blocking, pt appeared to be responding to internal stimuli, but pt returned to the quiet room

## 2014-06-10 LAB — LIPID PANEL
CHOL/HDL RATIO: 2.7 ratio
Cholesterol: 128 mg/dL (ref 0–200)
HDL: 47 mg/dL (ref >40)
LDL Cholesterol: 67 mg/dL (ref 0–99)
TRIGLYCERIDES: 70 mg/dL (ref <150)
VLDL: 14 mg/dL (ref 0–40)

## 2014-06-10 LAB — BASIC METABOLIC PANEL
Anion gap: 7 (ref 5–15)
BUN: 13 mg/dL (ref 6–20)
CO2: 28 mmol/L (ref 22–32)
Calcium: 9 mg/dL (ref 8.9–10.3)
Chloride: 103 mmol/L (ref 101–111)
Creatinine, Ser: 1.1 mg/dL — ABNORMAL HIGH (ref 0.44–1.00)
GFR calc Af Amer: 58 mL/min — ABNORMAL LOW (ref >60)
GFR calc non Af Amer: 50 mL/min — ABNORMAL LOW (ref >60)
Glucose, Bld: 95 mg/dL (ref 70–99)
Potassium: 3.5 mmol/L (ref 3.5–5.1)
Sodium: 138 mmol/L (ref 135–145)

## 2014-06-10 MED ORDER — ENSURE ENLIVE PO LIQD
237.0000 mL | Freq: Three times a day (TID) | ORAL | Status: DC
  Administered 2014-06-11: 237 mL via ORAL

## 2014-06-10 MED ORDER — FLUOXETINE HCL 20 MG PO CAPS
30.0000 mg | ORAL_CAPSULE | Freq: Every day | ORAL | Status: DC
  Administered 2014-06-11 – 2014-06-13 (×3): 30 mg via ORAL
  Filled 2014-06-10 (×5): qty 1

## 2014-06-10 MED ORDER — OLANZAPINE 5 MG PO TBDP
2.5000 mg | ORAL_TABLET | Freq: Three times a day (TID) | ORAL | Status: DC | PRN
  Filled 2014-06-10: qty 10

## 2014-06-10 MED ORDER — ARIPIPRAZOLE 2 MG PO TABS
4.0000 mg | ORAL_TABLET | Freq: Every day | ORAL | Status: DC
  Administered 2014-06-10 – 2014-06-12 (×3): 4 mg via ORAL
  Filled 2014-06-10 (×3): qty 2
  Filled 2014-06-10: qty 28
  Filled 2014-06-10 (×2): qty 2

## 2014-06-10 NOTE — BHH Group Notes (Signed)
[facility] Group Notes:  Recovery  Date:  06/10/2014  Time:  10:33 AM  Type of Therapy:  Nurse Education  Participation Level:  Active  Participation Quality:  Appropriate  Affect:  Appropriate  Cognitive:  Appropriate  Insight:  Appropriate  Engagement in Group:  Engaged  Modes of Intervention:  Discussion  Summary of Progress/Problems:pt appeared very lucid in group today  Delman Kitten 06/10/2014, 10:33 AM
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
## 2014-06-10 NOTE — BHH Group Notes (Signed)
[facility] LCSW Group Therapy  06/10/2014 , 2:43 PM   Type of Therapy:  Group Therapy  Participation Level:  Active  Participation Quality:  Attentive  Affect:  Appropriate  Cognitive:  Alert  Insight:  Improving  Engagement in Therapy:  Engaged  Modes of Intervention:  Discussion, Exploration and Socialization  Summary of Progress/Problems: Today's group focused on the term Diagnosis.  Participants were asked to define the term, and then pronounce whether it is a negative, positive or neutral term.  Unlike yesterday, was able to stay for entire group, and no tears nor overwhelming anxiety.  Has no idea what diagnosis is, but was able to describe pre admission symptoms fairly accurately.  Read some notes she had written that made no sense in the context of our discussion, but other than that was grounded in reality.  Stated doting on her grandchildren is her main coping skill.  Roque Lias B 06/10/2014 , 2:43 PM

## 2014-06-10 NOTE — Progress Notes (Signed)
Pt up and down during the night, pt up making comments "I can't do it by myself Mr. Ronalee Belts". Pt given Vistaril , then given Ativan . Pt is yelling out at times during the night.

## 2014-06-10 NOTE — Progress Notes (Signed)
Pt experienced : agitation, restlessness, irritability, confusion, disorientation at times, suspicion, yelling, pacing, and mood swings.

## 2014-06-10 NOTE — Progress Notes (Signed)
D: Pt denies SI/HI/AVH. Pt is pleasant and cooperative upon approach, pt only had one crying spell episode , one being labile. Pt appeared very lucid aware of her surroundings and interacted completely opposite from her behavior the previous nights. Pt was actually joking with Probation officer and did not have the usual blank flat look on her face. You could actully see the emotions the pt was going through during evening assessment (sad, joy, happy) . Pt asked writer to make sure to say goodbye in the morning before going home.   A: Pt was offered support and encouragement. Pt was given scheduled medications. Pt was encourage to attend groups. Q 15 minute checks were done for safety.   R:Pt attends groups and interacts well with peers and staff. Pt is taking medication. Pt has no complaints at this time .Pt receptive to treatment and safety maintained on unit.

## 2014-06-10 NOTE — Progress Notes (Addendum)
Pt is alert and oriented times three today. Initially she had a blank stare but now as the mornign has progressed she appears more lucid and did participate in group.Pt is cooperative and did shower this am. Pt denies SI and HI and was able to tell the nurse exactly where she was. She requested hot tea and was given this with her breakfast. Per MD pt will not receive any ativan.Pt rates her depression a 10/10 and her hopelessness a 4.5 /10.Pt stated,'I want to keep my head together. " Pt wants to know,"who am I and I need to be fixed."
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
## 2014-06-11 LAB — BASIC METABOLIC PANEL
Anion gap: 4 — ABNORMAL LOW (ref 5–15)
BUN: 21 mg/dL — ABNORMAL HIGH (ref 6–20)
CHLORIDE: 106 mmol/L (ref 101–111)
CO2: 29 mmol/L (ref 22–32)
Calcium: 8.8 mg/dL — ABNORMAL LOW (ref 8.9–10.3)
Creatinine, Ser: 1.08 mg/dL — ABNORMAL HIGH (ref 0.44–1.00)
GFR calc Af Amer: 59 mL/min — ABNORMAL LOW (ref >60)
GFR calc non Af Amer: 51 mL/min — ABNORMAL LOW (ref >60)
GLUCOSE: 108 mg/dL — AB (ref 70–99)
POTASSIUM: 3.7 mmol/L (ref 3.5–5.1)
Sodium: 139 mmol/L (ref 135–145)

## 2014-06-11 LAB — HEMOGLOBIN A1C
HEMOGLOBIN A1C: 5.9 % — AB (ref 4.8–5.6)
Mean Plasma Glucose: 123 mg/dL

## 2014-06-11 MED ORDER — DOCUSATE SODIUM 100 MG PO CAPS
100.0000 mg | ORAL_CAPSULE | Freq: Two times a day (BID) | ORAL | Status: DC
  Administered 2014-06-11 – 2014-06-13 (×5): 100 mg via ORAL
  Filled 2014-06-11: qty 1
  Filled 2014-06-11: qty 28
  Filled 2014-06-11 (×8): qty 1
  Filled 2014-06-11: qty 28

## 2014-06-11 MED ORDER — TRAZODONE HCL 50 MG PO TABS: 25.0000 mg | ORAL_TABLET | Freq: Every evening | ORAL | Status: DC | PRN

## 2014-06-11 NOTE — BHH Group Notes (Signed)
[facility] Mental Health Association Group Therapy  06/11/2014 , 1:46 PM    Type of Therapy:  Mental Health Association Presentation  Participation Level:  Active  Participation Quality:  Attentive  Affect:  Blunted  Cognitive:  Oriented  Insight:  Limited  Engagement in Therapy:  Engaged  Modes of Intervention:  Discussion, Education and Socialization  Summary of Progress/Problems:  Shanon Brow from [place] came to present his recovery story and play the guitar.  Sat quietly throughout.  Was attentive.  Roque Lias B 06/11/2014 , 1:46 PM

## 2014-06-11 NOTE — Plan of Care (Signed)
Problem: Consults Goal: Depression Patient Education See Patient Education Module for education specifics.  Outcome: Completed/Met Date Met:  06/11/14 Nurse discussed depression/coping skills with patient.

## 2014-06-11 NOTE — Progress Notes (Signed)
D:  Patient denied SI and HI this morning, contracts for safety.  Denied A/V hallucinations.  Denied pain.  Patient was crying and stated she does not want to lose her boys.   A:  Medications administered per MD orders.  Emotional support and encouragement given patient. R:  Safety maintained with 15 minute checks.
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
## 2014-06-11 NOTE — Consult Note (Signed)
[facility] Medical Consultation  Brooke Huber UEA:[id number] DOB: [birth year] DOA: 06/08/2014 PCP: No primary care provider on file.   Requesting physician: Ursula Alert, psychiatry Date of consultation: 06/11/14 Reason for consultation: Rule out other medical etiology for psychosis  Impression/Recommendations Principal Problem:   MDD (major depressive disorder), recurrent, severe, with psychosis Active Problems:   PTSD (post-traumatic stress disorder)   Panic disorder   Bereavement   Hypokalemia  I had an extensive discussion with the patient. Initially she was somewhat withdrawn, but with her son present she became very forthcoming. Exact insight as to what exactly has happened. She is oriented to space and time. She is aware why she is here. She is alert and oriented 4. She is able to follow specific commands and to me what exactly would be appropriate should she be in a certain situations such as finding a letter on the ground that was not addressed her, but already stamped. She is also able to focus on task and relate the months of the year backwards to me. Patient is not showing any signs of any acute delirium. She also shows no signs of dementia. Given a negative CT scan, normal electrolytes, She has no acute medical issues in regards to this. I am going to go ahead and discontinue her when necessary Vistaril And trazodone in that in the elderly, this mixed with an antipsychotic could cause some acute delirium, however again at this time I'm not seeing that.  Chief Complaint: Confusion  HPI:  Patient is a 69 year old female with past oral history of depression who lost a close friend one week ago. Since that time she started reporting hearing auditory hallucinations. Patient has had one or 2 previous episodes in her lifetime, reportedly under stressful situations.  She had some reported episodes of confusion and was brought in by her family.  It was felt that she was having  acute psychosis from major depression and was brought into behavioral health Hospital. There were some concerns that her confusion may be part medical related and patient was perhaps either delirious or has underlying dementia and hospitalists were called for further evaluation.  Review of Systems:  Patient denies any headaches, vision changes, dysphagia, chest pain, palpitations, shortness of breath, wheeze, cough, abdominal pain, hematuria, dysuria, constipation, diarrhea, focal extremity numbness weakness or pain. Review of systems is otherwise negative.  Past Medical History  Diagnosis Date  . Depression   . Hepatitis    History reviewed. No pertinent past surgical history. Social History:  reports that she has never smoked. She does not have any smokeless tobacco history on file. She reports that she does not drink alcohol or use illicit drugs.  Allergies  Allergen Reactions  . Penicillins Hives and Rash   Family History  Problem Relation Age of Onset  . Depression Neg Hx     Prior to Admission medications   Medication Sig Start Date End Date Taking? Authorizing Provider  ketoconazole (NIZORAL) 2 % cream  07/19/13  Yes Historical Provider, MD  ketoconazole (NIZORAL) 200 MG tablet  07/19/13  Yes Historical Provider, MD  sertraline (ZOLOFT) 100 MG tablet Take 50-100 mg by mouth daily. Takes 50mg  daily for 7 days, then increases to 100mg  daily   Yes Historical Provider, MD  sertraline (ZOLOFT) 100 MG tablet Take 100 mg by mouth. 01/12/12  Yes Historical Provider, MD   Physical Exam: Blood pressure 118/67, pulse 92, temperature 98.3 F (36.8 C), temperature source Oral, resp. rate 20, height 5\' 3"  (  1.6 m), weight 83.462 kg (184 lb), SpO2 97 %. Filed Vitals:   06/11/14 1826  BP: 118/67  Pulse: 92  Temp:   Resp:      General:  Alert and oriented 4, no acute distress, slightly flattened affect  Eyes: Sclera nonicteric, extraocular movements are intact  ENT: Normocephalic,  atraumatic, mucous members are moist  Neck: No JVD  Cardiovascular: Regular rate and rhythm, S1-S2  Respiratory: Clear to auscultation bilaterally  Abdomen: Soft, nontender, nondistended, positive bowel sounds  Skin: Sun damaged skin otherwise no skin breaks tears or lesions  Musculoskeletal: No clubbing or cyanosis or edema  Psychiatric: Flattened affect, otherwise appropriate Neurologic: No focal deficits  Labs on Admission:  Basic Metabolic Panel:  Recent Labs Lab 06/08/14 1559 06/10/14 0615 06/11/14 0624  NA 140 138 139  K 3.1* 3.5 3.7  CL 102 103 106  CO2 27 28 29   GLUCOSE 114* 95 108*  BUN 18 13 21*  CREATININE 1.09* 1.10* 1.08*  CALCIUM 9.0 9.0 8.8*   Liver Function Tests:  Recent Labs Lab 06/08/14 1559  AST 24  ALT 16  ALKPHOS 71  BILITOT 1.6*  PROT 7.3  ALBUMIN 4.2   No results for input(s): LIPASE, AMYLASE in the last 168 hours. No results for input(s): AMMONIA in the last 168 hours. CBC:  Recent Labs Lab 06/08/14 1559  WBC 10.8*  NEUTROABS 7.6  HGB 14.5  HCT 42.8  MCV 88.6  PLT 254   Cardiac Enzymes: No results for input(s): CKTOTAL, CKMB, CKMBINDEX, TROPONINI in the last 168 hours. BNP: Invalid input(s): POCBNP CBG: No results for input(s): GLUCAP in the last 168 hours.  Radiological Exams on Admission: No results found.   Time spent: 35 minutes  [facility] Pager [phone]  If 7PM-7AM, please contact night-coverage [email or web address] Password [facility] 06/11/2014, 7:34 PM

## 2014-06-11 NOTE — Progress Notes (Signed)
Pt was given 4mg  Abilify only. Pt has been sleep a lot of the evening.

## 2014-06-12 MED ORDER — MICONAZOLE NITRATE 2 % EX CREA
1.0000 "application " | TOPICAL_CREAM | Freq: Two times a day (BID) | CUTANEOUS | Status: DC
  Filled 2014-06-12: qty 14

## 2014-06-12 MED ORDER — MICONAZOLE NITRATE 2 % EX CREA
1.0000 "application " | TOPICAL_CREAM | Freq: Two times a day (BID) | CUTANEOUS | Status: DC
  Administered 2014-06-12 – 2014-06-13 (×2): 1 via TOPICAL
  Filled 2014-06-12: qty 14

## 2014-06-12 NOTE — Tx Team (Signed)
Interdisciplinary Treatment Plan Update (Adult)  Date:  06/12/2014   Time Reviewed:  8:08 AM   Progress in Treatment: Attending groups: Yes. Participating in groups:  Yes. Taking medication as prescribed:  Yes. Tolerating medication:  Yes. Family/Significant othe contact made:  Yes Patient understands diagnosis:  Yes   Discussing patient identified problems/goals with staff:  Yes, see initial care plan. Medical problems stabilized or resolved:  Yes. Denies suicidal/homicidal ideation: Yes. Issues/concerns per patient self-inventory:  No. Other:  New problem(s) identified:  Discharge Plan or Barriers:  Return home, follow up outpt  Reason for Continuation of Hospitalization:   Comments:  Estimated length of stay:  Likely d/c tomorrow  New goal(s):  Review of initial/current patient goals per problem list:     Attendees: Patient:  06/12/2014 8:08 AM   Family:   06/12/2014 8:08 AM   Physician:  Ursula Alert, MD 06/12/2014 8:08 AM   Nursing:   Clinton Sawyer, RN 06/12/2014 8:08 AM   CSW:    Roque Lias, LCSW   06/12/2014 8:08 AM   Other:  06/12/2014 8:08 AM   Other:   06/12/2014 8:08 AM   Other:  Lars Pinks, Nurse CM 06/12/2014 8:08 AM   Other:  Lucinda Dell, [facility] 06/12/2014 8:08 AM   Other:  Norberto Sorenson, [place]  06/12/2014 8:08 AM   Other:  06/12/2014 8:08 AM   Other:  06/12/2014 8:08 AM   Other:  06/12/2014 8:08 AM   Other:  06/12/2014 8:08 AM   Other:  06/12/2014 8:08 AM   Other:   06/12/2014 8:08 AM    Scribe for Treatment Team:   Trish Mage, 06/12/2014 8:08 AM

## 2014-06-12 NOTE — Progress Notes (Signed)
D: Pt denies SI/HI/AVH. Pt is pleasant and cooperative. Pt appears a little clear today, not as disorganized as in the previous days. Pt continues to have thought blocking , but appears to still have issues processing some information. Pt does not have the blank look she has from time to time, pt was not labile tonight and pt did not have a crying spell this evening.   A: Pt was offered support and encouragement. Pt was given scheduled medications. Pt was encourage to attend groups. Q 15 minute checks were done for safety.   R:Pt attends groups and interacts well with peers and staff. Pt is taking medication. Pt has no complaints at this time .Pt receptive to treatment and safety maintained on unit.

## 2014-06-12 NOTE — Progress Notes (Addendum)
Morning Wellness Group - 0900  The focus of this group is to educate the patient on the purpose and policies of crisis stabilization and provide a format to answer questions about their admission.  The group details unit policies and expectations of patients while admitted.  Patient attended. Patient participated in group and showed insight when discussing coping skills and future plans to prevent readmission.

## 2014-06-12 NOTE — BHH Group Notes (Signed)
[place] Group Notes:  (Counselor/Nursing/MHT/Case Management/Adjunct)  06/12/2014 1:15PM  Type of Therapy:  Group Therapy  Participation Level:  Active  Participation Quality:  Appropriate  Affect:  Flat  Cognitive:  Oriented  Insight:  Improving  Engagement in Group:  Limited  Engagement in Therapy:  Limited  Modes of Intervention:  Discussion, Exploration and Socialization  Summary of Progress/Problems: The topic for group was balance in life.  Pt participated in the discussion about when their life was in balance and out of balance and how this feels.  Pt discussed ways to get back in balance and short term goals they can work on to get where they want to be. Engaged throughout.  Started group by saying she needs help with leaving her past behind and moving forward "without hurting others."  Rather vague, but elicited responses from others about similar situations, and she appreciated the feedback.  Was actually unable to determine if she feels unbalanced or balanced today, but was not overhwlemed by trying to figure it out.  Shed a few tears, but mood much more stable than recent past.   Trish Mage 06/12/2014 5:22 PM

## 2014-06-12 NOTE — Progress Notes (Addendum)
D: Patient denies SI/HI and auditory and visual hallucinations. The patient has an anxious mood and affect. When questioned about her anxiety, the patient reports that she is "not sure why" she is anxious but that her anxiety has improved since her admission. The patient reports that she is "feeling much better today" and that she "slept well" last night. The patient is attending groups and participating appropriately within the milieu.   A: Patient given emotional support from RN. Patient encouraged to come to staff with concerns and/or questions. [name] medication routine continued. [name] orders and plan of care reviewed.  R: Patient remains appropriate and cooperative. Will continue to monitor patient q15 minutes for safety.
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
## 2014-06-13 ENCOUNTER — Encounter (HOSPITAL_COMMUNITY): Payer: Self-pay | Admitting: Registered Nurse

## 2014-06-13 MED ORDER — ARIPIPRAZOLE 2 MG PO TABS: 4.0000 mg | ORAL_TABLET | Freq: Every day | ORAL | Status: DC

## 2014-06-13 MED ORDER — MICONAZOLE NITRATE 2 % EX CREA: 1.0000 "application " | TOPICAL_CREAM | Freq: Two times a day (BID) | CUTANEOUS | Status: DC

## 2014-06-13 MED ORDER — OLANZAPINE 5 MG PO TBDP: 2.5000 mg | ORAL_TABLET | Freq: Three times a day (TID) | ORAL | Status: DC | PRN

## 2014-06-13 MED ORDER — FLUOXETINE HCL 10 MG PO CAPS: 30.0000 mg | ORAL_CAPSULE | Freq: Every day | ORAL | Status: DC

## 2014-06-13 MED ORDER — FLUOXETINE HCL 10 MG PO CAPS
30.0000 mg | ORAL_CAPSULE | Freq: Every day | ORAL | Status: DC
  Filled 2014-06-13 (×2): qty 42

## 2014-06-13 MED ORDER — DOCUSATE SODIUM 100 MG PO CAPS: 100.0000 mg | ORAL_CAPSULE | Freq: Two times a day (BID) | ORAL | Status: DC
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
## 2014-06-13 NOTE — Progress Notes (Signed)
D. Pt had been up and visible in milieu this evening, did attend and participate in evening group activity. Pt spoke of her day and spoke of feeling better but did speak of feeling anxious throughout the day. Pt did speak of how she might be able to go home soon but not exactly sure when. Pt did receive medications without incident. A. Support and encouragement provided. R. Safety maintained, will continue to monitor.
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
## 2014-06-13 NOTE — Progress Notes (Signed)
Pt is D/C home. Pt denies SI/HI/AV. Pt follow-up and medications were reviewed and pt verbalized understanding. Pt pleasant and cooperative. Pt belongings were returned.

## 2014-06-13 NOTE — Progress Notes (Signed)
  [facility] Adult Case Management Discharge Plan :  Will you be returning to the same living situation after discharge:  Yes,  home At discharge, do you have transportation home?: Yes,  family Do you have the ability to pay for your medications: Yes,  MCR  Release of information consent forms completed and in the chart;  [name] signature needed at discharge.  Patient to Follow up at: Follow-up Information    Follow up with [facility] On 07/08/2014.   Why:  Tuesday with Tharon Aquas, therapist.  Come at 8:15 with your paperwork filled out for your 9:00 appointment   Contact information:   [place] [phone]      Follow up with [facility] On 07/23/2014.   Why:  Wednesday at 11:00 with Dr Adele Schilder      Patient denies SI/HI: Yes,  yes    Safety Planning and Suicide Prevention discussed: Yes,  yes  Have you used any form of tobacco in the last 30 days? (Cigarettes, Smokeless Tobacco, Cigars, and/or Pipes): No  Has patient been referred to the Quitline?: N/A patient is not a smoker  [place] B 06/13/2014, 10:58 AM

## 2014-06-16 NOTE — Progress Notes (Signed)
Called patient to discuss that she was send home on two antipsychotics - abilify as well as Zyprexa prn ( which was for agitation while in hospital). Left Voicemail asking patient to call back.  Ursula Alert ,MD Attending [facility]

## 2014-07-01 ENCOUNTER — Encounter (HOSPITAL_COMMUNITY): Payer: Self-pay | Admitting: Clinical

## 2014-07-01 ENCOUNTER — Ambulatory Visit (INDEPENDENT_AMBULATORY_CARE_PROVIDER_SITE_OTHER): Payer: PPO | Admitting: Clinical

## 2014-07-01 DIAGNOSIS — F4321 Adjustment disorder with depressed mood: Secondary | ICD-10-CM

## 2014-07-01 DIAGNOSIS — F332 Major depressive disorder, recurrent severe without psychotic features: Secondary | ICD-10-CM | POA: Diagnosis not present

## 2014-07-01 DIAGNOSIS — F4310 Post-traumatic stress disorder, unspecified: Secondary | ICD-10-CM | POA: Diagnosis not present
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
## 2014-07-08 ENCOUNTER — Ambulatory Visit (HOSPITAL_COMMUNITY): Payer: Self-pay | Admitting: Clinical

## 2014-07-16 ENCOUNTER — Ambulatory Visit (INDEPENDENT_AMBULATORY_CARE_PROVIDER_SITE_OTHER): Payer: PPO | Admitting: Clinical

## 2014-07-16 DIAGNOSIS — F332 Major depressive disorder, recurrent severe without psychotic features: Secondary | ICD-10-CM | POA: Diagnosis not present

## 2014-07-16 DIAGNOSIS — F4310 Post-traumatic stress disorder, unspecified: Secondary | ICD-10-CM | POA: Diagnosis not present

## 2014-07-16 DIAGNOSIS — F4321 Adjustment disorder with depressed mood: Secondary | ICD-10-CM | POA: Diagnosis not present

## 2014-07-16 NOTE — Progress Notes (Signed)
   THERAPIST PROGRESS NOTE  Session Time: 2:32 -3:28  Participation Level: Active  Behavioral Response: Casual and NeatAlertDepressed  Type of Therapy: Individual Therapy  Treatment Goals addressed:   improve psychiatric symptoms,improve unhelpful thinking patterns, stress management skills  Interventions: CBT and Motivational Interviewing  Summary: Brooke Huber is a 69 y.o. female who presents with Major Depressive Disorder, Grief and PTSD   Suicidal/Homicidal: No -without intent/plan  Therapist Response:  Colleen met with clinician for an individual session. Brooke Huber discussed her psychiatric symptoms and her current life events. Brooke Huber shared that she was experiencing a lot of depression. She shared that she has a lot of stressors in her life. Her biggest concern right now is her son's addiction problem. She shared how it affects her and his children. She shared that she feels helpless to improve the situations. Client and clinician discussed what was in her control and what was out of her control. Brooke Huber shared her insight that taking care of herself and being good to her grandchildren was all that she could do. Client and clinician discussed some basic cbt concepts. Client and clinician discussed how our thoughts affect our emotions and actions. Clinician shared that she was not suggesting putting on a happy face and pretending but rather seeing things more realistically than the depressive thoughts present things. Client and clinician worked some automatic negative thoughts, discussed the evidence for and against them. Brooke Huber formulated some healthy alternative thoughts. Client and clinician agreed to discuss this topic further at a future session.  Plan: Return again in 1-2 weeks.  Diagnosis:     Axis I: Major Depressive Disorder, Grief and PTSD     Dayleen Beske A, LCSW 07/16/2014

## 2014-07-17 ENCOUNTER — Telehealth (HOSPITAL_COMMUNITY): Payer: Self-pay

## 2014-07-17 DIAGNOSIS — F333 Major depressive disorder, recurrent, severe with psychotic symptoms: Secondary | ICD-10-CM

## 2014-07-17 NOTE — Telephone Encounter (Signed)
Medication refill - patient has not seen Dr. Adele Schilder yet and was scheduled for 07/23/14 but was now rescheduled as he will be out on that date and is set for first evaluation on  09/02/14.  Patient requests Dr. Adele Schilder refill her prescriptons until she can be seen for first evaluation.  Last orders by Genella Mech, NP on 06/13/14.

## 2014-07-18 ENCOUNTER — Other Ambulatory Visit (HOSPITAL_COMMUNITY): Payer: Self-pay | Admitting: Psychiatry

## 2014-07-19 ENCOUNTER — Encounter (HOSPITAL_COMMUNITY): Payer: Self-pay | Admitting: Clinical

## 2014-07-22 MED ORDER — ARIPIPRAZOLE 2 MG PO TABS: 4.0000 mg | ORAL_TABLET | Freq: Every day | ORAL | Status: DC

## 2014-07-22 MED ORDER — FLUOXETINE HCL 10 MG PO CAPS: 30.0000 mg | ORAL_CAPSULE | Freq: Every day | ORAL | Status: DC

## 2014-07-22 NOTE — Telephone Encounter (Signed)
Telephone call with Earleen Newport, NP after discussing with Dr. Adele Schilder patient was moved back until 09/02/14 for her first appointment form 07/23/14.  Shuvon Rankin, NP agreed to authorize refills of patient's prescribed Abilify and Prozac until patient could be seen for first evaluation by Dr. Adele Schilder now on 09/02/14.  New orders for patient's prescribed Abilify and Prozac with one refill each e-scribed into patient's [facility] in [place] as patient requested.  Called patient to inform orders were e-scribed into her pharmacy and encouraged patient to keep her appointment on 09/02/14.

## 2014-07-23 ENCOUNTER — Ambulatory Visit (HOSPITAL_COMMUNITY): Payer: Self-pay | Admitting: Psychiatry

## 2014-07-23 ENCOUNTER — Telehealth (HOSPITAL_COMMUNITY): Payer: Self-pay

## 2014-07-23 NOTE — Telephone Encounter (Signed)
Telephone call to [facility] in [place] on [place] as patient's prescriptions from 07/22/14 were printed and did not go through as e-scriptions.  Called in orders for patient's approved Abilify and Prozac from [facility], NP.  Called in orders with Clarene Critchley, pharmacist at [facility] in [place] both orders plus one refill per Earleen Newport, NP.

## 2014-07-30 ENCOUNTER — Ambulatory Visit (INDEPENDENT_AMBULATORY_CARE_PROVIDER_SITE_OTHER): Payer: PPO | Admitting: Clinical

## 2014-07-30 ENCOUNTER — Encounter (HOSPITAL_COMMUNITY): Payer: Self-pay | Admitting: Clinical

## 2014-07-30 DIAGNOSIS — F4310 Post-traumatic stress disorder, unspecified: Secondary | ICD-10-CM

## 2014-07-30 DIAGNOSIS — F332 Major depressive disorder, recurrent severe without psychotic features: Secondary | ICD-10-CM

## 2014-07-30 DIAGNOSIS — F4321 Adjustment disorder with depressed mood: Secondary | ICD-10-CM

## 2014-07-30 NOTE — Progress Notes (Signed)
THERAPIST PROGRESS NOTE  Session Time: 1:30  Participation Level: Active  Behavioral Response: CasualAlertDepressed  Type of Therapy: Individual Therapy  Treatment Goals addressed:   improve psychiatric symptoms, improve unhelpful thinking patterns, stress management skills  Interventions: CBT and Motivational Interviewing  Summary: Brooke Huber is a 69 y.o. female who presents with Major Depressive Disorder, Grief and PTSD   Suicidal/Homicidal: No -without intent/plan  Therapist Response:  Brooke Huber met with clinician for an individual session. Brooke Huber discussed her psychiatric symptoms, her current life events, and her homework. Brooke Huber shared that she was having a difficult time with her depression. She shared that her son's active addiction causes her to loose sleep and makes it impossible to leave the house if he is there alone. Brooke Huber shared about how his addiction affects his wife and her grandchild whom she loves very much. Clinician shared about the group [facility]  And encouraged her to go if she wanted some peer support. When asked Brooke Huber shared about how she copes. She smiles as she shared about spending time with her grandson. She shared how for a short bit of time they could forget the world if they are out walking and exploring nature. Clinician introduced grounding and mindfulness techniques. Clinician explained the process, purpose and application. Client and clinician practiced some of the techniques together. Brooke Huber and clinician discussed how she could practiced the techniques with her grandson if she wanted to. Brooke Huber agreed to practice her techniques daily until next session.  Plan: Return again in 1-2 weeks.  Diagnosis:     Axis I: Major Depressive Disorder, Grief and PTSD    Steadman Prosperi A, LCSW 07/30/2014

## 2014-08-13 ENCOUNTER — Ambulatory Visit (INDEPENDENT_AMBULATORY_CARE_PROVIDER_SITE_OTHER): Payer: PPO | Admitting: Psychiatry

## 2014-08-13 ENCOUNTER — Encounter (HOSPITAL_COMMUNITY): Payer: Self-pay | Admitting: Psychiatry

## 2014-08-13 VITALS — BP 104/72 | HR 57 | Ht 62.75 in | Wt 186.2 lb

## 2014-08-13 DIAGNOSIS — F333 Major depressive disorder, recurrent, severe with psychotic symptoms: Secondary | ICD-10-CM | POA: Diagnosis not present

## 2014-08-13 MED ORDER — RISPERIDONE 0.5 MG PO TABS: 0.5000 mg | ORAL_TABLET | Freq: Every day | ORAL | Status: DC

## 2014-08-13 MED ORDER — FLUOXETINE HCL 10 MG PO CAPS: 30.0000 mg | ORAL_CAPSULE | Freq: Every day | ORAL | Status: DC
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
## 2014-08-21 ENCOUNTER — Other Ambulatory Visit: Payer: Self-pay | Admitting: Gastroenterology

## 2014-08-21 DIAGNOSIS — B181 Chronic viral hepatitis B without delta-agent: Secondary | ICD-10-CM

## 2014-09-02 ENCOUNTER — Ambulatory Visit (HOSPITAL_COMMUNITY): Payer: Self-pay | Admitting: Psychiatry

## 2014-09-12 ENCOUNTER — Ambulatory Visit
Admission: RE | Admit: 2014-09-12 | Discharge: 2014-09-12 | Disposition: A | Discharge status: Home / Self Care | Payer: PPO | Source: Ambulatory Visit | Attending: Gastroenterology | Admitting: Gastroenterology

## 2014-09-12 DIAGNOSIS — B181 Chronic viral hepatitis B without delta-agent: Secondary | ICD-10-CM

## 2014-10-22 ENCOUNTER — Telehealth (HOSPITAL_COMMUNITY): Payer: Self-pay

## 2014-10-22 DIAGNOSIS — F333 Major depressive disorder, recurrent, severe with psychotic symptoms: Secondary | ICD-10-CM

## 2014-10-22 NOTE — Telephone Encounter (Signed)
Telephone call message left for patient to call back after she left a message requesting this nurse call her.  Questioned patient on message left if she was in need of a Prozac refill and if we needed to have her return earlier to see Dr. Doyne Keel who is now back from maternity leave.  Requested patient call this nurse back today to follow up.

## 2014-10-24 MED ORDER — FLUOXETINE HCL 10 MG PO CAPS: 30.0000 mg | ORAL_CAPSULE | Freq: Every day | ORAL | Status: DC

## 2014-10-24 NOTE — Telephone Encounter (Signed)
Met with Dr. Salem Senate after patient left a message she did need a one time refill of prescribed Prozac until returns to see Dr. Doyne Keel on 11/18/14 at 3:30pm.  E-scribed a one time refill of patient's requested Prozac 16m, 3 by mouth daily, #90 with no refills to patient's [facility] authorized by Dr. TSalem Senate  Called patient to inform a new Prozac order was e-scribed to her [facility] as she had requested and reminded of need to keep appointment with Dr. ADoyne Keelon 11/18/14.  Patient agreed with plan and will call back if any problems filling medication.

## 2014-11-18 ENCOUNTER — Ambulatory Visit (INDEPENDENT_AMBULATORY_CARE_PROVIDER_SITE_OTHER): Payer: PPO | Admitting: Psychiatry

## 2014-11-18 ENCOUNTER — Encounter (HOSPITAL_COMMUNITY): Payer: Self-pay | Admitting: Psychiatry

## 2014-11-18 VITALS — BP 132/72 | HR 60 | Resp 12 | Wt 191.6 lb

## 2014-11-18 DIAGNOSIS — F410 Panic disorder [episodic paroxysmal anxiety] without agoraphobia: Secondary | ICD-10-CM | POA: Diagnosis not present

## 2014-11-18 DIAGNOSIS — F333 Major depressive disorder, recurrent, severe with psychotic symptoms: Secondary | ICD-10-CM

## 2014-11-18 DIAGNOSIS — F4310 Post-traumatic stress disorder, unspecified: Secondary | ICD-10-CM

## 2014-11-18 MED ORDER — RISPERIDONE 0.5 MG PO TABS: 0.5000 mg | ORAL_TABLET | Freq: Every day | ORAL | Status: DC

## 2014-11-18 MED ORDER — SERTRALINE HCL 50 MG PO TABS: 50.0000 mg | ORAL_TABLET | Freq: Every day | ORAL | Status: DC

## 2014-11-18 NOTE — Progress Notes (Signed)
[facility] [phone] Progress Note  Brooke Huber [id number] 69 y.o.  11/18/2014 4:16 PM  Chief Complaint: need new meds b/c of cost  History of Present Illness: Pt states she is unable to afford Prozac as [facility] charges her $40 her a month. She would like to go back to Zoloft as it was helpful.  Depression began after 2 friends died recently. Her son has a drug and alcohol problem. Now depression is more situational. Denies anhedonia, crying spells, worthlessness, hopelessness and isolation.   Risperdal is helping with sleep. She is getting about 6-7 hrs/night. Denies AVH. Energy is good. Appetite is increased.   PTSD- reports it is trigger induced by stress and her son.   Taking meds as prescribed and denies SE.   Suicidal Ideation: No Plan Formed: No Patient has means to carry out plan: No  Homicidal Ideation: No Plan Formed: No Patient has means to carry out plan: No  Review of Systems: Psychiatric: Agitation: No Hallucination: No Depressed Mood: Yes Insomnia: No Hypersomnia: No Altered Concentration: No Feels Worthless: No Grandiose Ideas: No Belief In Special Powers: No New/Increased Substance Abuse: No Compulsions: No  Review of Systems  Constitutional: Negative for fever, chills and weight loss.  HENT: Negative for congestion, ear pain, nosebleeds and sore throat.   Eyes: Negative for blurred vision, double vision and redness.  Respiratory: Negative for cough, shortness of breath and wheezing.   Cardiovascular: Negative for chest pain, palpitations and leg swelling.  Gastrointestinal: Negative for heartburn, nausea, vomiting and abdominal pain.  Musculoskeletal: Negative for back pain, joint pain and neck pain.  Skin: Negative for itching and rash.  Neurological: Negative for dizziness, tremors, seizures, loss of consciousness and headaches.  Psychiatric/Behavioral: Positive for depression. Negative for suicidal ideas, hallucinations and  substance abuse. The patient is not nervous/anxious and does not have insomnia.      Past Medical Family, Social History: divorced, mother of 3. Living alone but son is temporarily staying with pt. States that she was married twice the first marriage lasted 62 years and and she divorced in the second one lasted for a month. Patient has worked at multiple jobs and went on [employer] and is now on [employer].  reports that she has never smoked. She has never used smokeless tobacco. She reports that she does not drink alcohol or use illicit drugs.  Past Medical History  Diagnosis Date  . Depression   . Hepatitis   . Arthritis 02/1995    Diagnosed in hands   . Hepatitis B 02/1995   Family History  Problem Relation Age of Onset  . Depression Neg Hx   . Alzheimer's disease Sister   . Dementia Sister   . Sexual abuse Sister   . Seizures Sister   . Alcohol abuse Brother   . Alcohol abuse Maternal Uncle   . Drug abuse Cousin   . Drug abuse Son     Outpatient Encounter Prescriptions as of 11/18/2014  Medication Sig  . docusate sodium (COLACE) 100 MG capsule Take 1 capsule (100 mg total) by mouth 2 (two) times daily.  . miconazole (MICOTIN) 2 % cream Apply 1 application topically 2 (two) times daily. ta affected area  . risperiDONE (RISPERDAL) 0.5 MG tablet Take 1 tablet (0.5 mg total) by mouth at bedtime.  Marland Kitchen FLUoxetine (PROZAC) 10 MG capsule Take 3 capsules (30 mg total) by mouth daily. (Patient not taking: Reported on 11/18/2014)   No facility-administered encounter medications on file as of 11/18/2014.  Past Psychiatric History/Hospitalization(s): Anxiety: Yes Bipolar Disorder: No Depression: Yes Mania: No Psychosis: Yes Schizophrenia: No Personality Disorder: No Hospitalization for psychiatric illness: Yes [facility] from 06/09/2014 - 06/13/2014 For depression confusion and auditory hallucinations. Patient was hospitalized 3 others times in 1993, 1994 for suicidal  ideation and and 1997 after her divorce. History of Electroconvulsive Shock Therapy: No Prior Suicide Attempts: No  Physical Exam: Constitutional:  BP 132/72 mmHg  Pulse 60  Resp 12  Wt 191 lb 9.6 oz (86.909 kg)  General Appearance: alert, oriented, no acute distress  Musculoskeletal: Strength & Muscle Tone: within normal limits Gait & Station: normal Patient leans: N/A  Mental Status Examination/Evaluation: Objective: Attitude: Calm and cooperative  Appearance: Casual, appears to be stated age  Eye Contact::  Fair  Speech:  Clear and Coherent and Normal Rate, gives short answers to question  Volume:  Normal  Mood:  euthymic  Affect:  Constricted  Thought Process:  slowed  Orientation:  Full (Time, Place, and Person)  Thought Content:  Negative  Suicidal Thoughts:  No  Homicidal Thoughts:  No  Judgement:  Fair  Insight:  Shallow  Concentration: good  Memory: Immediate-fair to poor Recent-fair to poor Remote-fair  Recall: fair  Language: fair  Gait and Station: normal  [employer] of Knowledge: average  Psychomotor Activity:  Decreased  Akathisia:  No  Handed:  Right  AIMS (if indicated):  Facial and Oral Movements  Muscles of Facial Expression: None, normal  Lips and Perioral Area: None, normal  Jaw: None, normal  Tongue: None, normal Extremity Movements: Upper (arms, wrists, hands, fingers): None, normal  Lower (legs, knees, ankles, toes): None, normal,  Trunk Movements:  Neck, shoulders, hips: None, normal,  Overall Severity : Severity of abnormal movements (highest score from questions above): None, normal  Incapacitation due to abnormal movements: None, normal  [name] awareness of abnormal movements (rate only patient's report): No Awareness, Dental Status  Current problems with teeth and/or dentures?: No  Does patient usually wear dentures?: No    Assets:  Communication Skills Desire for Improvement       Medical Decision Making (Choose  Three): Established Problem, Stable/Improving (1), Review of Psycho-Social Stressors (1), Review or order clinical lab tests (1), Review of Medication Regimen & Side Effects (2) and Review of New Medication or Change in Dosage (2)  Assessment: MDD- severe with psychosis; PTSD; Panic disorder   Medication management #1 patient will be treated for her depression and anxiety with Zoloft 50mg  by mouth every a.m. . D/c Prozac due to cost #2 PTSD will be treated with Zoloft and Risperdal 0.5mg  po qHS for psychosis #3 psychosis- Continue Risperdal 0.5mg  qHS  Medication management with supportive therapy. Risks/benefits and SE of the medication discussed. Pt verbalized understanding and verbal consent obtained for treatment.  Affirm with the patient that the medications are taken as ordered. Patient expressed understanding of how their medications were to be used.  -improvement of symptoms  Labs: pt will send in copy of recent labs  Therapy: brief supportive therapy provided. Discussed psychosocial stressors in detail.    Pt denies SI and is at an acute low risk for suicide.Patient told to call clinic if any problems occur. Patient advised to go to ER if they should develop SI/HI, side effects, or if symptoms worsen. Has crisis numbers to call if needed. Pt verbalized understanding.  F/up in 2 months or sooner if needed   Charlcie Cradle, MD 11/18/2014

## 2015-01-20 ENCOUNTER — Encounter (HOSPITAL_COMMUNITY): Payer: Self-pay | Admitting: Psychiatry

## 2015-01-20 ENCOUNTER — Other Ambulatory Visit (HOSPITAL_COMMUNITY): Payer: Self-pay | Admitting: Psychiatry

## 2015-01-20 ENCOUNTER — Ambulatory Visit (INDEPENDENT_AMBULATORY_CARE_PROVIDER_SITE_OTHER): Payer: PPO | Admitting: Psychiatry

## 2015-01-20 VITALS — BP 136/80 | HR 62 | Ht 62.0 in | Wt 195.0 lb

## 2015-01-20 DIAGNOSIS — F410 Panic disorder [episodic paroxysmal anxiety] without agoraphobia: Secondary | ICD-10-CM

## 2015-01-20 DIAGNOSIS — F430 Acute stress reaction: Secondary | ICD-10-CM

## 2015-01-20 DIAGNOSIS — F4310 Post-traumatic stress disorder, unspecified: Secondary | ICD-10-CM

## 2015-01-20 DIAGNOSIS — F333 Major depressive disorder, recurrent, severe with psychotic symptoms: Secondary | ICD-10-CM

## 2015-01-20 MED ORDER — SERTRALINE HCL 50 MG PO TABS: 50.0000 mg | ORAL_TABLET | Freq: Every day | ORAL | Status: DC

## 2015-01-20 MED ORDER — RISPERIDONE 0.5 MG PO TABS: 0.5000 mg | ORAL_TABLET | Freq: Every day | ORAL | Status: DC

## 2015-01-20 NOTE — Progress Notes (Signed)
Patient ID: Brooke Huber, female   DOB: [birth year], 69 y.o.   MRN: [id number]  [place] [address] Progress Note  Brooke Huber [id number] 69 y.o.  01/20/2015 11:12 AM  Chief Complaint: alright  History of Present Illness: Overall ok but concerned about her BP as she was told it was elevated this morning.   Now depression is more situational. It happens about 3-4 days a month. Denies anhedonia, crying spells, worthlessness, hopelessness and isolation.   Risperdal is helping with sleep. She is getting about 6-7 hrs/night. Denies AVH and paranoia. Energy is on the low side. Appetite is increased.   PTSD- reports it is trigger induced by stress and her son (when he is actively drinking and abusing drugs). Denies nightmares, intrusive memories and flashbacks. Son is moving out in the new year.   Pt reports one stress induced panic attack per month. States she treats by distracting herself and blowing out steam.   Taking meds as prescribed and denies SE.   Suicidal Ideation: No Plan Formed: No Patient has means to carry out plan: No  Homicidal Ideation: No Plan Formed: No Patient has means to carry out plan: No  Review of Systems: Psychiatric: Agitation: No Hallucination: No Depressed Mood: Yes Insomnia: No Hypersomnia: No Altered Concentration: No Feels Worthless: No Grandiose Ideas: No Belief In Special Powers: No New/Increased Substance Abuse: No Compulsions: No  Review of Systems  Constitutional: Negative for fever, chills and weight loss.  HENT: Negative for congestion, ear pain, nosebleeds and sore throat.   Eyes: Negative for blurred vision, double vision and redness.  Respiratory: Negative for cough, shortness of breath and wheezing.   Cardiovascular: Negative for chest pain, palpitations and leg swelling.  Gastrointestinal: Negative for heartburn, nausea, vomiting and abdominal pain.  Musculoskeletal: Negative for back pain, joint pain and  neck pain.  Skin: Negative for itching and rash.  Neurological: Negative for dizziness, tremors, seizures, loss of consciousness and headaches.  Psychiatric/Behavioral: Positive for depression. Negative for suicidal ideas, hallucinations and substance abuse. The patient is not nervous/anxious and does not have insomnia.      Past Medical Family, Social History: divorced, mother of 3. Living alone but son is temporarily staying with pt. States that she was married twice the first marriage lasted 34 years and and she divorced in the second one lasted for a month. Patient has worked at multiple jobs and went on [employer] and is now on [employer].  reports that she has never smoked. She has never used smokeless tobacco. She reports that she does not drink alcohol or use illicit drugs.  Past Medical History  Diagnosis Date  . Depression   . Hepatitis   . Arthritis 02/1995    Diagnosed in hands   . Hepatitis B 02/1995   Family History  Problem Relation Age of Onset  . Depression Neg Hx   . Alzheimer's disease Sister   . Dementia Sister   . Sexual abuse Sister   . Seizures Sister   . Alcohol abuse Brother   . Alcohol abuse Maternal Uncle   . Drug abuse Cousin   . Drug abuse Son     Outpatient Encounter Prescriptions as of 01/20/2015  Medication Sig  . risperiDONE (RISPERDAL) 0.5 MG tablet Take 1 tablet (0.5 mg total) by mouth at bedtime.  . sertraline (ZOLOFT) 50 MG tablet Take 1 tablet (50 mg total) by mouth daily.  Marland Kitchen docusate sodium (COLACE) 100 MG capsule Take 1 capsule (100 mg total)  by mouth 2 (two) times daily. (Patient not taking: Reported on 01/20/2015)  . miconazole (MICOTIN) 2 % cream Apply 1 application topically 2 (two) times daily. ta affected area (Patient not taking: Reported on 01/20/2015)   No facility-administered encounter medications on file as of 01/20/2015.    Past Psychiatric History/Hospitalization(s): Anxiety: Yes Bipolar Disorder: No Depression:  Yes Mania: No Psychosis: Yes Schizophrenia: No Personality Disorder: No Hospitalization for psychiatric illness: Yes [facility] 06/09/2014 - 06/13/2014 For depression confusion and auditory hallucinations. Patient was hospitalized 3 others times in 1993, 1994 for suicidal ideation and and 1997 after her divorce. History of Electroconvulsive Shock Therapy: No Prior Suicide Attempts: No  Physical Exam: Constitutional:  BP 136/80 mmHg  Pulse 62  Ht 5\' 2"  (1.575 m)  Wt 195 lb (88.451 kg)  BMI 35.66 kg/m2  General Appearance: alert, oriented, no acute distress  Musculoskeletal: Strength & Muscle Tone: within normal limits Gait & Station: normal Patient leans: N/A  Mental Status Examination/Evaluation: Objective: Attitude: Calm and cooperative  Appearance: Casual, appears to be stated age  Eye Contact::  Fair  Speech:  Clear and Coherent and Normal Rate, gives short answers to question  Volume:  Normal  Mood:  euthymic  Affect:  Constricted  Thought Process:  slowed  Orientation:  Full (Time, Place, and Person)  Thought Content:  Negative  Suicidal Thoughts:  No  Homicidal Thoughts:  No  Judgement:  Fair  Insight:  Shallow  Concentration: good  Memory: Immediate-fair to poor Recent-fair to poor Remote-fair  Recall: fair  Language: fair  Gait and Station: normal  [employer] of Knowledge: average  Psychomotor Activity:  Decreased  Akathisia:  No  Handed:  Right  AIMS (if indicated):  Facial and Oral Movements  Muscles of Facial Expression: None, normal  Lips and Perioral Area: None, normal  Jaw: None, normal  Tongue: None, normal Extremity Movements: Upper (arms, wrists, hands, fingers): None, normal  Lower (legs, knees, ankles, toes): None, normal,  Trunk Movements:  Neck, shoulders, hips: None, normal,  Overall Severity : Severity of abnormal movements (highest score from questions above): None, normal  Incapacitation due to abnormal movements:  None, normal  [name] awareness of abnormal movements (rate only patient's report): No Awareness, Dental Status  Current problems with teeth and/or dentures?: No  Does patient usually wear dentures?: No    Assets:  Communication Skills Desire for Improvement       Medical Decision Making (Choose Three): Established Problem, Stable/Improving (1), Review of Psycho-Social Stressors (1) and Review of Medication Regimen & Side Effects (2)  Assessment: MDD- severe with psychosis; PTSD; Panic disorder   Medication management #1 patient will be treated for her depression and anxiety with Zoloft 50mg  by mouth every a.m. .  #2 PTSD will be treated with Zoloft and Risperdal 0.5mg  po qHS for psychosis #3 psychosis- Continue Risperdal 0.5mg  qHS  Medication management with supportive therapy. Risks/benefits and SE of the medication discussed. Pt verbalized understanding and verbal consent obtained for treatment.  Affirm with the patient that the medications are taken as ordered. Patient expressed understanding of how their medications were to be used.  -improvement of symptoms  Labs: pt will send in copy of recent labs  Therapy: brief supportive therapy provided. Discussed psychosocial stressors in detail.   Advised pt to monitor BP at home on regular basis and keep a log.  Pt denies SI and is at an acute low risk for suicide.Patient told to call clinic if any  problems occur. Patient advised to go to ER if they should develop SI/HI, side effects, or if symptoms worsen. Has crisis numbers to call if needed. Pt verbalized understanding.  F/up in 3 months or sooner if needed   Charlcie Cradle, MD 01/20/2015

## 2015-02-19 ENCOUNTER — Other Ambulatory Visit: Payer: Self-pay | Admitting: Gastroenterology

## 2015-02-19 DIAGNOSIS — B181 Chronic viral hepatitis B without delta-agent: Secondary | ICD-10-CM | POA: Diagnosis not present

## 2015-02-25 ENCOUNTER — Ambulatory Visit
Admission: RE | Admit: 2015-02-25 | Discharge: 2015-02-25 | Disposition: A | Discharge status: Home / Self Care | Payer: PPO | Source: Ambulatory Visit | Attending: Gastroenterology | Admitting: Gastroenterology

## 2015-02-25 DIAGNOSIS — B181 Chronic viral hepatitis B without delta-agent: Secondary | ICD-10-CM

## 2015-02-25 DIAGNOSIS — B1910 Unspecified viral hepatitis B without hepatic coma: Secondary | ICD-10-CM | POA: Diagnosis not present

## 2015-03-10 DIAGNOSIS — Z0000 Encounter for general adult medical examination without abnormal findings: Secondary | ICD-10-CM | POA: Diagnosis not present

## 2015-03-10 DIAGNOSIS — E7800 Pure hypercholesterolemia, unspecified: Secondary | ICD-10-CM | POA: Diagnosis not present

## 2015-03-10 DIAGNOSIS — N951 Menopausal and female climacteric states: Secondary | ICD-10-CM | POA: Diagnosis not present

## 2015-03-26 DIAGNOSIS — M85851 Other specified disorders of bone density and structure, right thigh: Secondary | ICD-10-CM | POA: Diagnosis not present

## 2015-03-26 DIAGNOSIS — M85852 Other specified disorders of bone density and structure, left thigh: Secondary | ICD-10-CM | POA: Diagnosis not present

## 2015-03-26 DIAGNOSIS — Z1231 Encounter for screening mammogram for malignant neoplasm of breast: Secondary | ICD-10-CM | POA: Diagnosis not present

## 2015-03-26 DIAGNOSIS — Z803 Family history of malignant neoplasm of breast: Secondary | ICD-10-CM | POA: Diagnosis not present

## 2015-04-21 ENCOUNTER — Ambulatory Visit (HOSPITAL_COMMUNITY): Payer: Self-pay | Admitting: Psychiatry

## 2015-04-23 ENCOUNTER — Ambulatory Visit (INDEPENDENT_AMBULATORY_CARE_PROVIDER_SITE_OTHER): Payer: PPO | Admitting: Psychiatry

## 2015-04-23 ENCOUNTER — Encounter (HOSPITAL_COMMUNITY): Payer: Self-pay | Admitting: Psychiatry

## 2015-04-23 VITALS — BP 104/70 | HR 72 | Ht 63.0 in | Wt 192.8 lb

## 2015-04-23 DIAGNOSIS — F4310 Post-traumatic stress disorder, unspecified: Secondary | ICD-10-CM | POA: Diagnosis not present

## 2015-04-23 DIAGNOSIS — F333 Major depressive disorder, recurrent, severe with psychotic symptoms: Secondary | ICD-10-CM

## 2015-04-23 DIAGNOSIS — F410 Panic disorder [episodic paroxysmal anxiety] without agoraphobia: Secondary | ICD-10-CM

## 2015-04-23 MED ORDER — RISPERIDONE 0.5 MG PO TABS: 0.5000 mg | ORAL_TABLET | Freq: Every day | ORAL | Status: DC

## 2015-04-23 MED ORDER — SERTRALINE HCL 50 MG PO TABS: 50.0000 mg | ORAL_TABLET | Freq: Every day | ORAL | Status: DC

## 2015-04-23 NOTE — Progress Notes (Signed)
Patient ID: Brooke Huber, female   DOB: 1945-08-23, 70 y.o.   MRN: [id number] Patient ID: Brooke Huber, female   DOB: Feb 06, 1945, 70 y.o.   MRN: [id number]  [place] [address] Progress Note  Brooke Huber [id number] 70 y.o.  04/23/2015 10:14 AM  Chief Complaint: good  History of Present Illness: Overall doing well and has no concerns today.   Now depression is more situational. It happens about 3-4 days a month when she has trouble with her son who uses drugs. Denies anhedonia, crying spells, worthlessness, hopelessness and isolation.   Risperdal is helping with sleep. She is getting about 6-7 hrs/night. Pt takes it 2-3x/week.  Denies AVH and paranoia. Energy and appetite are good.Marland Kitchen   PTSD- reports it is trigger induced by stress and her son (when he is actively drinking and abusing drugs). Her usual response is to get irritable. Denies nightmares, intrusive memories and flashbacks.   Pt reports one stress induced panic attack per month. States she treats by distracting herself and blowing out steam.   Taking meds as prescribed and denies SE.   Suicidal Ideation: No Plan Formed: No Patient has means to carry out plan: No  Homicidal Ideation: No Plan Formed: No Patient has means to carry out plan: No  Review of Systems: Psychiatric: Agitation: No Hallucination: No Depressed Mood: Yes Insomnia: No Hypersomnia: No Altered Concentration: No Feels Worthless: No Grandiose Ideas: No Belief In Special Powers: No New/Increased Substance Abuse: No Compulsions: No  Review of Systems  Constitutional: Negative for fever, chills and weight loss.  HENT: Negative for congestion, ear pain, nosebleeds and sore throat.   Eyes: Negative for blurred vision, double vision and redness.  Respiratory: Negative for cough, shortness of breath and wheezing.   Cardiovascular: Negative for chest pain, palpitations and leg swelling.  Gastrointestinal: Negative for  heartburn, nausea, vomiting and abdominal pain.  Musculoskeletal: Negative for back pain, joint pain and neck pain.  Skin: Negative for itching and rash.  Neurological: Negative for dizziness, tremors, seizures, loss of consciousness and headaches.  Psychiatric/Behavioral: Positive for depression. Negative for suicidal ideas, hallucinations and substance abuse. The patient is not nervous/anxious and does not have insomnia.      Past Medical Family, Social History: divorced, mother of 3. Living alone but son is temporarily staying with pt. States that she was married twice the first marriage lasted 76 years and and she divorced in the second one lasted for a month. Patient has worked at multiple jobs and went on [employer] and is now on [employer].  reports that she has never smoked. She has never used smokeless tobacco. She reports that she does not drink alcohol or use illicit drugs.  Past Medical History  Diagnosis Date  . Depression   . Hepatitis   . Arthritis 02/1995    Diagnosed in hands   . Hepatitis B 02/1995   Family History  Problem Relation Age of Onset  . Depression Neg Hx   . Alzheimer's disease Sister   . Dementia Sister   . Sexual abuse Sister   . Seizures Sister   . Alcohol abuse Brother   . Alcohol abuse Maternal Uncle   . Drug abuse Cousin   . Drug abuse Son     Outpatient Encounter Prescriptions as of 04/23/2015  Medication Sig  . risperiDONE (RISPERDAL) 0.5 MG tablet Take 1 tablet (0.5 mg total) by mouth at bedtime.  . sertraline (ZOLOFT) 50 MG tablet Take 1 tablet (50 mg  total) by mouth daily.  Marland Kitchen docusate sodium (COLACE) 100 MG capsule Take 1 capsule (100 mg total) by mouth 2 (two) times daily. (Patient not taking: Reported on 01/20/2015)  . miconazole (MICOTIN) 2 % cream Apply 1 application topically 2 (two) times daily. ta affected area (Patient not taking: Reported on 01/20/2015)   No facility-administered encounter medications on file as of 04/23/2015.     Past Psychiatric History/Hospitalization(s): Anxiety: Yes Bipolar Disorder: No Depression: Yes Mania: No Psychosis: Yes Schizophrenia: No Personality Disorder: No Hospitalization for psychiatric illness: Yes [facility] from 06/09/2014 - 06/13/2014 For depression confusion and auditory hallucinations. Patient was hospitalized 3 others times in 1993, 1994 for suicidal ideation and and 1997 after her divorce. History of Electroconvulsive Shock Therapy: No Prior Suicide Attempts: No  Physical Exam: Constitutional:  BP 104/70 mmHg  Pulse 72  Ht 5\' 3"  (1.6 m)  Wt 192 lb 12.8 oz (87.454 kg)  BMI 34.16 kg/m2  General Appearance: alert, oriented, no acute distress  Musculoskeletal: Strength & Muscle Tone: within normal limits Gait & Station: normal Patient leans: straight  Mental Status Examination/Evaluation: Objective: Attitude: Calm and cooperative  Appearance: Casual, appears to be stated age  Eye Contact::  Fair  Speech:  Clear and Coherent and Normal Rate, gives short answers to question  Volume:  Decreased  Mood:  euthymic  Affect:  Constricted  Thought Process:  slowed  Orientation:  Full (Time, Place, and Person)  Thought Content:  Negative  Suicidal Thoughts:  No  Homicidal Thoughts:  No  Judgement:  Fair  Insight:  Shallow  Concentration: good  Memory: Immediate-fair to poor Recent-fair to poor Remote-fair  Recall: fair  Language: fair  Gait and Station: normal  [employer] of Knowledge: average  Psychomotor Activity:  Decreased  Akathisia:  No  Handed:  Right  AIMS (if indicated):  Facial and Oral Movements  Muscles of Facial Expression: None, normal  Lips and Perioral Area: None, normal  Jaw: None, normal  Tongue: None, normal Extremity Movements: Upper (arms, wrists, hands, fingers): None, normal  Lower (legs, knees, ankles, toes): None, normal,  Trunk Movements:  Neck, shoulders, hips: None, normal,  Overall Severity : Severity of  abnormal movements (highest score from questions above): None, normal  Incapacitation due to abnormal movements: None, normal  [name] awareness of abnormal movements (rate only patient's report): No Awareness, Dental Status  Current problems with teeth and/or dentures?: No  Does patient usually wear dentures?: No    Assets:  Communication Skills Desire for Improvement       Medical Decision Making (Choose Three): Established Problem, Stable/Improving (1), Review of Psycho-Social Stressors (1) and Review of Medication Regimen & Side Effects (2)  Assessment: MDD- severe with psychosis; PTSD; Panic disorder   Medication management #1 patient will be treated for her depression and anxiety with Zoloft 50mg  by mouth every a.m. .  #2 PTSD will be treated with Zoloft and Risperdal 0.5mg  po qHS for psychosis #3 psychosis- Continue Risperdal 0.5mg  qHS  Medication management with supportive therapy. Risks/benefits and SE of the medication discussed. Pt verbalized understanding and verbal consent obtained for treatment.  Affirm with the patient that the medications are taken as ordered. Patient expressed understanding of how their medications were to be used.  -improvement of symptoms  Labs: pt will send in copy of recent labs, pt signed MR to get labs today  Therapy: brief supportive therapy provided. Discussed psychosocial stressors in detail.   Advised pt to monitor BP at  home on regular basis and keep a log.  Pt denies SI and is at an acute low risk for suicide.Patient told to call clinic if any problems occur. Patient advised to go to ER if they should develop SI/HI, side effects, or if symptoms worsen. Has crisis numbers to call if needed. Pt verbalized understanding.  F/up in 3 months or sooner if needed   Charlcie Cradle, MD 04/23/2015

## 2015-07-30 ENCOUNTER — Encounter (HOSPITAL_COMMUNITY): Payer: Self-pay | Admitting: Psychiatry

## 2015-07-30 ENCOUNTER — Ambulatory Visit (INDEPENDENT_AMBULATORY_CARE_PROVIDER_SITE_OTHER): Payer: PPO | Admitting: Psychiatry

## 2015-07-30 VITALS — BP 136/78 | HR 59 | Ht 63.0 in | Wt 182.0 lb

## 2015-07-30 DIAGNOSIS — F333 Major depressive disorder, recurrent, severe with psychotic symptoms: Secondary | ICD-10-CM | POA: Diagnosis not present

## 2015-07-30 DIAGNOSIS — F410 Panic disorder [episodic paroxysmal anxiety] without agoraphobia: Secondary | ICD-10-CM

## 2015-07-30 DIAGNOSIS — F4310 Post-traumatic stress disorder, unspecified: Secondary | ICD-10-CM

## 2015-07-30 MED ORDER — RISPERIDONE 0.5 MG PO TABS: 0.5000 mg | ORAL_TABLET | Freq: Every day | ORAL | Status: DC

## 2015-07-30 MED ORDER — SERTRALINE HCL 50 MG PO TABS: 50.0000 mg | ORAL_TABLET | Freq: Every day | ORAL | Status: DC

## 2015-07-30 NOTE — Progress Notes (Signed)
Patient ID: Brooke Huber, female   DOB: April 19, 1945, 70 y.o.   MRN: [id number]  [place] Progress Note  Brooke GLAZER [id number] 70 y.o.  07/30/2015 10:40 AM  Chief Complaint: good  History of Present Illness: Overall doing well and has no concerns today.   Son may move out by the end of the year. Things are ok as long as he remains sober.   Now depression is more situational. It happens about 3-4 days a month when she has trouble with her son who uses drugs. Denies anhedonia, crying spells, worthlessness, hopelessness and isolation.   She is getting about 5-6 hrs/night. Pt takes Risperdal 1-2x/week. She doesn't want to become dependent on it for sleep. Denies AVH and paranoia. Energy and appetite are good.Marland Kitchen   PTSD- reports it is trigger induced by stress and her son (when he is actively drinking and abusing drugs). Her usual response is to get irritable. Denies nightmares, intrusive memories and flashbacks.   Pt reports one stress induced panic attack per week States she treats by distracting herself and blowing out steam.   Taking meds as prescribed and denies SE.   Suicidal Ideation: No Plan Formed: No Patient has means to carry out plan: No  Homicidal Ideation: No Plan Formed: No Patient has means to carry out plan: No  Review of Systems: Psychiatric: Agitation: No Hallucination: No Depressed Mood: Yes Insomnia: No Hypersomnia: No Altered Concentration: No Feels Worthless: No Grandiose Ideas: No Belief In Special Powers: No New/Increased Substance Abuse: No Compulsions: No  Review of Systems  Constitutional: Negative for fever, chills and weight loss.  HENT: Negative for congestion, ear pain, nosebleeds and sore throat.   Eyes: Negative for blurred vision, double vision and redness.  Respiratory: Negative for cough, shortness of breath and wheezing.   Cardiovascular: Negative for chest pain, palpitations and leg swelling.   Gastrointestinal: Negative for heartburn, nausea, vomiting and abdominal pain.  Musculoskeletal: Negative for back pain, joint pain and neck pain.  Skin: Negative for itching and rash.  Neurological: Negative for dizziness, tremors, seizures, loss of consciousness and headaches.  Psychiatric/Behavioral: Positive for depression. Negative for suicidal ideas, hallucinations and substance abuse. The patient is nervous/anxious. The patient does not have insomnia.      Past Medical Family, Social History: divorced, mother of 3. Living alone but son is temporarily staying with pt. States that she was married twice the first marriage lasted 52 years and and she divorced in the second one lasted for a month. Patient has worked at multiple jobs and went on [employer] and is now on [employer].  reports that she has never smoked. She has never used smokeless tobacco. She reports that she does not drink alcohol or use illicit drugs.  Past Medical History  Diagnosis Date  . Depression   . Hepatitis   . Arthritis 02/1995    Diagnosed in hands   . Hepatitis B 02/1995   Family History  Problem Relation Age of Onset  . Depression Neg Hx   . Alzheimer's disease Sister   . Dementia Sister   . Sexual abuse Sister   . Seizures Sister   . Alcohol abuse Brother   . Alcohol abuse Maternal Uncle   . Drug abuse Cousin   . Drug abuse Son     Outpatient Encounter Prescriptions as of 07/30/2015  Medication Sig  . sertraline (ZOLOFT) 50 MG tablet Take 1 tablet (50 mg total) by mouth daily.  Marland Kitchen docusate sodium (  COLACE) 100 MG capsule Take 1 capsule (100 mg total) by mouth 2 (two) times daily. (Patient not taking: Reported on 01/20/2015)  . miconazole (MICOTIN) 2 % cream Apply 1 application topically 2 (two) times daily. ta affected area (Patient not taking: Reported on 01/20/2015)  . risperiDONE (RISPERDAL) 0.5 MG tablet Take 1 tablet (0.5 mg total) by mouth at bedtime. (Patient not taking: Reported on 07/30/2015)    No facility-administered encounter medications on file as of 07/30/2015.    Past Psychiatric History/Hospitalization(s): Anxiety: Yes Bipolar Disorder: No Depression: Yes Mania: No Psychosis: Yes Schizophrenia: No Personality Disorder: No Hospitalization for psychiatric illness: Yes [facility] from 06/09/2014 - 06/13/2014 For depression confusion and auditory hallucinations. Patient was hospitalized 3 others times in 1993, 1994 for suicidal ideation and and 1997 after her divorce. History of Electroconvulsive Shock Therapy: No Prior Suicide Attempts: No  Physical Exam: Constitutional:  BP 136/78 mmHg  Pulse 59  Ht 5\' 3"  (1.6 m)  Wt 182 lb (82.555 kg)  BMI 32.25 kg/m2  General Appearance: alert, oriented, no acute distress  Musculoskeletal: Strength & Muscle Tone: within normal limits Gait & Station: normal Patient leans: straight  Mental Status Examination/Evaluation: Objective: Attitude: Calm and cooperative  Appearance: Casual, appears to be stated age  Eye Contact::  Fair  Speech:  Clear and Coherent and Slow, gives short answers to question  Volume:  Decreased  Mood:  euthymic  Affect:  Constricted  Thought Process:  slowed  Orientation:  Full (Time, Place, and Person)  Thought Content:  Negative  Suicidal Thoughts:  No  Homicidal Thoughts:  No  Judgement:  Fair  Insight:  Shallow  Concentration: good  Memory: Immediate-fair to poor Recent-fair to poor Remote-fair  Recall: fair  Language: fair  Gait and Station: normal  [employer] of Knowledge: average  Psychomotor Activity:  Decreased  Akathisia:  No  Handed:  Right  AIMS (if indicated):  Facial and Oral Movements  Muscles of Facial Expression: None, normal  Lips and Perioral Area: None, normal  Jaw: None, normal  Tongue: None, normal Extremity Movements: Upper (arms, wrists, hands, fingers): None, normal  Lower (legs, knees, ankles, toes): None, normal,  Trunk Movements:  Neck,  shoulders, hips: None, normal,  Overall Severity : Severity of abnormal movements (highest score from questions above): None, normal  Incapacitation due to abnormal movements: None, normal  [name] awareness of abnormal movements (rate only patient's report): No Awareness, Dental Status  Current problems with teeth and/or dentures?: No  Does patient usually wear dentures?: No    Assets:  Communication Skills Desire for Improvement       Medical Decision Making (Choose Three): Established Problem, Stable/Improving (1), Review of Psycho-Social Stressors (1) and Review of Medication Regimen & Side Effects (2)  Assessment: MDD- severe with psychosis; PTSD; Panic disorder   Medication management #1 patient will be treated for her depression and anxiety with Zoloft 50mg  by mouth every a.m. .  #2 PTSD will be treated with Zoloft and Risperdal 0.5mg  po qHS for psychosis #3 psychosis- Continue Risperdal 0.5mg  qHS. Encouraged daily compliance with Risperdal  Medication management with supportive therapy. Risks/benefits and SE of the medication discussed. Pt verbalized understanding and verbal consent obtained for treatment.  Affirm with the patient that the medications are taken as ordered. Patient expressed understanding of how their medications were to be used.  -improvement of symptoms  Labs: pt will send in copy of recent labs, pt signed MR to get labs today  Therapy:  brief supportive therapy provided. Discussed psychosocial stressors in detail.   Advised pt to monitor BP at home on regular basis and keep a log.  Pt denies SI and is at an acute low risk for suicide.Patient told to call clinic if any problems occur. Patient advised to go to ER if they should develop SI/HI, side effects, or if symptoms worsen. Has crisis numbers to call if needed. Pt verbalized understanding.  F/up in 3 months or sooner if needed   Charlcie Cradle, MD 07/30/2015

## 2015-08-20 ENCOUNTER — Other Ambulatory Visit (HOSPITAL_COMMUNITY): Payer: Self-pay | Admitting: Gastroenterology

## 2015-08-20 DIAGNOSIS — B181 Chronic viral hepatitis B without delta-agent: Secondary | ICD-10-CM

## 2015-08-31 ENCOUNTER — Ambulatory Visit (HOSPITAL_COMMUNITY)
Admission: RE | Admit: 2015-08-31 | Discharge: 2015-08-31 | Disposition: A | Discharge status: Home / Self Care | Payer: PPO | Source: Ambulatory Visit | Attending: Gastroenterology | Admitting: Gastroenterology

## 2015-08-31 DIAGNOSIS — B181 Chronic viral hepatitis B without delta-agent: Secondary | ICD-10-CM | POA: Diagnosis not present

## 2015-08-31 DIAGNOSIS — K8020 Calculus of gallbladder without cholecystitis without obstruction: Secondary | ICD-10-CM | POA: Diagnosis not present

## 2015-08-31 DIAGNOSIS — K7689 Other specified diseases of liver: Secondary | ICD-10-CM | POA: Diagnosis not present

## 2015-10-29 ENCOUNTER — Ambulatory Visit (INDEPENDENT_AMBULATORY_CARE_PROVIDER_SITE_OTHER): Payer: PPO | Admitting: Psychiatry

## 2015-10-29 ENCOUNTER — Encounter (HOSPITAL_COMMUNITY): Payer: Self-pay | Admitting: Psychiatry

## 2015-10-29 VITALS — BP 126/76 | HR 61 | Ht 63.0 in | Wt 190.0 lb

## 2015-10-29 DIAGNOSIS — F4310 Post-traumatic stress disorder, unspecified: Secondary | ICD-10-CM

## 2015-10-29 DIAGNOSIS — F410 Panic disorder [episodic paroxysmal anxiety] without agoraphobia: Secondary | ICD-10-CM

## 2015-10-29 DIAGNOSIS — Z79899 Other long term (current) drug therapy: Secondary | ICD-10-CM

## 2015-10-29 DIAGNOSIS — F333 Major depressive disorder, recurrent, severe with psychotic symptoms: Secondary | ICD-10-CM | POA: Diagnosis not present

## 2015-10-29 MED ORDER — SERTRALINE HCL 50 MG PO TABS: 50.0000 mg | ORAL_TABLET | Freq: Every day | ORAL | 1 refills | Status: DC

## 2015-10-29 MED ORDER — RISPERIDONE 0.5 MG PO TABS: 0.5000 mg | ORAL_TABLET | Freq: Every day | ORAL | 1 refills | Status: DC

## 2015-10-29 NOTE — Progress Notes (Signed)
Patient ID: Brooke Huber, female   DOB: Nov 30, 1945, 70 y.o.   MRN: [id number]  [place] [address] Progress Note  Brooke ROSELAND [id number] 70 y.o.  10/29/2015 10:30 AM  Chief Complaint: "Fine"  History of Present Illness: Overall doing well and has no concerns today.   Son and his family have all moved out and things are much better.   Now depression is more situational. It happens about 3-4 days a month when she has trouble with her son who uses drugs. Denies anhedonia, crying spells, worthlessness, hopelessness and isolation.   She is getting about 5-6 hrs/night. Pt takes Risperdal 1-2x/week. She doesn't want to become dependent on it for sleep. Denies AVH and paranoia. Energy and appetite are good.Marland Kitchen   PTSD- reports it is trigger induced by stress and her son (when he is actively drinking and abusing drugs). Her usual response is to get irritable. Denies nightmares, intrusive memories and flashbacks. It has improved a lot since he moved out.   Pt denies stress induced panic attack.   Taking meds as prescribed and denies SE.   Suicidal Ideation: No Plan Formed: No Patient has means to carry out plan: No  Homicidal Ideation: No Plan Formed: No Patient has means to carry out plan: No  Review of Systems: Psychiatric: Agitation: No Hallucination: No Depressed Mood: Yes Insomnia: No Hypersomnia: No Altered Concentration: No Feels Worthless: No Grandiose Ideas: No Belief In Special Powers: No New/Increased Substance Abuse: No Compulsions: No  Review of Systems  Constitutional: Negative for chills, fever and weight loss.  HENT: Negative for congestion, ear pain, nosebleeds and sore throat.   Eyes: Negative for blurred vision, double vision and redness.  Respiratory: Negative for cough, shortness of breath and wheezing.   Cardiovascular: Negative for chest pain, palpitations and leg swelling.  Gastrointestinal: Negative for abdominal pain, heartburn,  nausea and vomiting.  Musculoskeletal: Positive for joint pain. Negative for back pain and neck pain.  Skin: Negative for itching and rash.  Neurological: Negative for dizziness, tremors, seizures, loss of consciousness and headaches.  Psychiatric/Behavioral: Positive for depression. Negative for hallucinations, substance abuse and suicidal ideas. The patient is nervous/anxious. The patient does not have insomnia.      Past Medical Family, Social History: divorced, mother of 3. Living alone but son is temporarily staying with pt. States that she was married twice the first marriage lasted 29 years and and she divorced in the second one lasted for a month. Patient has worked at multiple jobs and went on [employer] and is now on [employer].  reports that she has never smoked. She has never used smokeless tobacco. She reports that she does not drink alcohol or use drugs.  Past Medical History:  Diagnosis Date  . 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 Depression   . Hepatitis   . Hepatitis B 02/1995   Family History  Problem Relation Age of Onset  . Depression Neg Hx   . Alzheimer's disease Sister   . Dementia Sister   . Sexual abuse Sister   . Seizures Sister   . Alcohol abuse Brother   . Alcohol abuse Maternal Uncle   . Drug abuse Cousin   . Drug abuse Son     Outpatient Encounter Prescriptions as of 10/29/2015  Medication Sig  . risperiDONE (RISPERDAL) 0.5 MG tablet Take 1 tablet (0.5 mg total) by mouth at bedtime.  . sertraline (ZOLOFT) 50 MG tablet Take 1 tablet (50 mg total)  by mouth daily.  Marland Kitchen docusate sodium (COLACE) 100 MG capsule Take 1 capsule (100 mg total) by mouth 2 (two) times daily. (Patient not taking: Reported on 10/29/2015)  . miconazole (MICOTIN) 2 % cream Apply 1 application topically 2 (two) times daily. ta affected area (Patient not taking: Reported on 10/29/2015)   No facility-administered encounter medications on file as of 10/29/2015.     Past Psychiatric  History/Hospitalization(s): Anxiety: Yes Bipolar Disorder: No Depression: Yes Mania: No Psychosis: Yes Schizophrenia: No Personality Disorder: No Hospitalization for psychiatric illness: Yes [facility] from 06/09/2014 - 06/13/2014 For depression confusion and auditory hallucinations. Patient was hospitalized 3 others times in 1993, 1994 for suicidal ideation and and 1997 after her divorce. History of Electroconvulsive Shock Therapy: No Prior Suicide Attempts: No  Physical Exam: Constitutional:  BP 126/76 (BP Location: Left Arm, Patient Position: Sitting, Cuff Size: Normal)   Pulse 61   Ht 5\' 3"  (1.6 m)   Wt 190 lb (86.2 kg)   BMI 33.66 kg/m   General Appearance: alert, oriented, no acute distress  Musculoskeletal: Strength & Muscle Tone: within normal limits Gait & Station: normal Patient leans: straight  Mental Status Examination/Evaluation: Objective: Attitude: Calm and cooperative  Appearance: Casual, appears to be stated age  Eye Contact::  Fair  Speech:  Clear and Coherent and Slow, gives short answers to question  Volume:  Decreased  Mood:  euthymic  Affect:  Constricted  Thought Process:  slowed  Orientation:  Full (Time, Place, and Person)  Thought Content:  Negative  Suicidal Thoughts:  No  Homicidal Thoughts:  No  Judgement:  Fair  Insight:  Shallow  Concentration: good  Memory: Immediate-fair to poor Recent-fair to poor Remote-fair  Recall: fair  Language: fair  Gait and Station: normal  [employer] of Knowledge: average  Psychomotor Activity:  Decreased  Akathisia:  No  Handed:  Right  AIMS (if indicated):  Facial and Oral Movements  Muscles of Facial Expression: None, normal  Lips and Perioral Area: None, normal  Jaw: None, normal  Tongue: None, normal Extremity Movements: Upper (arms, wrists, hands, fingers): None, normal  Lower (legs, knees, ankles, toes): None, normal,  Trunk Movements:  Neck, shoulders, hips: None, normal,   Overall Severity : Severity of abnormal movements (highest score from questions above): None, normal  Incapacitation due to abnormal movements: None, normal  [name] awareness of abnormal movements (rate only patient's report): No Awareness, Dental Status  Current problems with teeth and/or dentures?: No  Does patient usually wear dentures?: No    Assets:  Communication Skills Desire for Improvement        Assessment: MDD- severe with psychosis; PTSD; Panic disorder   Medication management #1 patient will be treated for her depression and anxiety with Zoloft 50mg  by mouth every a.m. .  #2 PTSD will be treated with Zoloft and Risperdal 0.5mg  po qHS for psychosis #3 psychosis- Continue Risperdal 0.5mg  qHS. Encouraged daily compliance with Risperdal  Medication management with supportive therapy. Risks/benefits and SE of the medication discussed. Pt verbalized understanding and verbal consent obtained for treatment.  Affirm with the patient that the medications are taken as ordered. Patient expressed understanding of how their medications were to be used.  -improvement of symptoms  Labs: Feb 2017 CMP WNL, LDL elevated CBC, CMP, HbA1c, Lipid panel, TSH, Prolactin level, EKG    Therapy: brief supportive therapy provided. Discussed psychosocial stressors in detail.   Advised pt to monitor BP at home on regular basis and  keep a log.  Pt denies SI and is at an acute low risk for suicide.Patient told to call clinic if any problems occur. Patient advised to go to ER if they should develop SI/HI, side effects, or if symptoms worsen. Has crisis numbers to call if needed. Pt verbalized understanding.  F/up in 3 months or sooner if needed   Charlcie Cradle, MD 10/29/2015

## 2015-10-29 NOTE — Patient Instructions (Signed)
Yearly labs and EKG needed when taking Risperdal: CBC, CMP, HbA1c, Lipid panel, TSH, Prolactin level EKG

## 2015-11-05 ENCOUNTER — Other Ambulatory Visit: Payer: Self-pay | Admitting: Gastroenterology

## 2015-11-05 ENCOUNTER — Other Ambulatory Visit (HOSPITAL_COMMUNITY): Payer: Self-pay | Admitting: Psychiatry

## 2015-11-05 DIAGNOSIS — R079 Chest pain, unspecified: Secondary | ICD-10-CM

## 2015-11-10 ENCOUNTER — Ambulatory Visit (HOSPITAL_COMMUNITY)
Admission: RE | Admit: 2015-11-10 | Discharge: 2015-11-10 | Disposition: A | Discharge status: Home / Self Care | Payer: PPO | Source: Ambulatory Visit | Attending: Psychiatry | Admitting: Psychiatry

## 2015-11-10 DIAGNOSIS — R9431 Abnormal electrocardiogram [ECG] [EKG]: Secondary | ICD-10-CM | POA: Insufficient documentation

## 2015-11-10 DIAGNOSIS — Z79899 Other long term (current) drug therapy: Secondary | ICD-10-CM | POA: Diagnosis not present

## 2015-11-10 DIAGNOSIS — R001 Bradycardia, unspecified: Secondary | ICD-10-CM | POA: Diagnosis not present

## 2015-11-10 LAB — CBC
HEMATOCRIT: 41 % (ref 35.0–45.0)
Hemoglobin: 13.4 g/dL (ref 11.7–15.5)
MCH: 29.2 pg (ref 27.0–33.0)
MCHC: 32.7 g/dL (ref 32.0–36.0)
MCV: 89.3 fL (ref 80.0–100.0)
MPV: 10.5 fL (ref 7.5–12.5)
Platelets: 209 10*3/uL (ref 140–400)
RBC: 4.59 MIL/uL (ref 3.80–5.10)
RDW: 13.9 % (ref 11.0–15.0)
WBC: 8.2 10*3/uL (ref 3.8–10.8)

## 2015-11-11 ENCOUNTER — Encounter: Payer: Self-pay | Admitting: Psychiatry

## 2015-11-11 LAB — COMPREHENSIVE METABOLIC PANEL
ALK PHOS: 73 U/L (ref 33–130)
ALT: 12 U/L (ref 6–29)
AST: 20 U/L (ref 10–35)
Albumin: 4 g/dL (ref 3.6–5.1)
BUN: 14 mg/dL (ref 7–25)
CALCIUM: 8.8 mg/dL (ref 8.6–10.4)
CHLORIDE: 104 mmol/L (ref 98–110)
CO2: 27 mmol/L (ref 20–31)
Creat: 1.02 mg/dL — ABNORMAL HIGH (ref 0.60–0.93)
GLUCOSE: 84 mg/dL (ref 65–99)
POTASSIUM: 4.3 mmol/L (ref 3.5–5.3)
Sodium: 139 mmol/L (ref 135–146)
Total Bilirubin: 0.7 mg/dL (ref 0.2–1.2)
Total Protein: 6.5 g/dL (ref 6.1–8.1)

## 2015-11-11 LAB — TSH: TSH: 1.42 mIU/L

## 2015-11-11 LAB — PROLACTIN: PROLACTIN: 22.1 ng/mL

## 2015-11-11 LAB — HEMOGLOBIN A1C
Hgb A1c MFr Bld: 5.4 % (ref <5.7)
MEAN PLASMA GLUCOSE: 108 mg/dL

## 2015-12-16 ENCOUNTER — Encounter (HOSPITAL_COMMUNITY): Payer: Self-pay | Admitting: *Deleted

## 2015-12-29 ENCOUNTER — Ambulatory Visit (HOSPITAL_COMMUNITY): Payer: PPO | Admitting: Anesthesiology

## 2015-12-29 ENCOUNTER — Encounter (HOSPITAL_COMMUNITY)
Admission: RE | Disposition: A | Discharge status: Home / Self Care | Payer: Self-pay | Source: Ambulatory Visit | Attending: Gastroenterology

## 2015-12-29 ENCOUNTER — Encounter (HOSPITAL_COMMUNITY): Payer: Self-pay

## 2015-12-29 ENCOUNTER — Ambulatory Visit (HOSPITAL_COMMUNITY)
Admission: RE | Admit: 2015-12-29 | Discharge: 2015-12-29 | Disposition: A | Discharge status: Home / Self Care | Payer: PPO | Source: Ambulatory Visit | Attending: Gastroenterology | Admitting: Gastroenterology

## 2015-12-29 DIAGNOSIS — Z1211 Encounter for screening for malignant neoplasm of colon: Secondary | ICD-10-CM | POA: Insufficient documentation

## 2015-12-29 DIAGNOSIS — Z880 Allergy status to penicillin: Secondary | ICD-10-CM | POA: Diagnosis not present

## 2015-12-29 DIAGNOSIS — D122 Benign neoplasm of ascending colon: Secondary | ICD-10-CM | POA: Insufficient documentation

## 2015-12-29 DIAGNOSIS — K5730 Diverticulosis of large intestine without perforation or abscess without bleeding: Secondary | ICD-10-CM | POA: Diagnosis not present

## 2015-12-29 DIAGNOSIS — E876 Hypokalemia: Secondary | ICD-10-CM | POA: Diagnosis not present

## 2015-12-29 HISTORY — PX: COLONOSCOPY WITH PROPOFOL: SHX5780

## 2015-12-29 HISTORY — DX: Gastro-esophageal reflux disease without esophagitis: K21.9

## 2015-12-29 HISTORY — DX: Unspecified convulsions: R56.9

## 2015-12-29 SURGERY — COLONOSCOPY WITH PROPOFOL
Anesthesia: Monitor Anesthesia Care

## 2015-12-29 MED ORDER — PROPOFOL 500 MG/50ML IV EMUL
INTRAVENOUS | Status: DC | PRN
  Administered 2015-12-29: 140 ug/kg/min via INTRAVENOUS

## 2015-12-29 MED ORDER — LIDOCAINE 2% (20 MG/ML) 5 ML SYRINGE
INTRAMUSCULAR | Status: AC
  Filled 2015-12-29: qty 5

## 2015-12-29 MED ORDER — LACTATED RINGERS IV SOLN
INTRAVENOUS | Status: DC
  Administered 2015-12-29: 09:00:00 via INTRAVENOUS

## 2015-12-29 MED ORDER — PROPOFOL 10 MG/ML IV BOLUS
INTRAVENOUS | Status: DC | PRN
  Administered 2015-12-29 (×3): 20 mg via INTRAVENOUS

## 2015-12-29 MED ORDER — PROPOFOL 10 MG/ML IV BOLUS
INTRAVENOUS | Status: AC
  Filled 2015-12-29: qty 40

## 2015-12-29 MED ORDER — LIDOCAINE 2% (20 MG/ML) 5 ML SYRINGE
INTRAMUSCULAR | Status: DC | PRN
  Administered 2015-12-29: 100 mg via INTRAVENOUS

## 2015-12-29 MED ORDER — SODIUM CHLORIDE 0.9 % IV SOLN: INTRAVENOUS | Status: DC

## 2015-12-29 MED ORDER — PROPOFOL 10 MG/ML IV BOLUS
INTRAVENOUS | Status: AC
  Filled 2015-12-29: qty 20

## 2015-12-29 SURGICAL SUPPLY — 22 items

## 2015-12-29 NOTE — H&P (Signed)
Procedure: Screening colonoscopy. 08/30/2005 normal screening colonoscopy was performed  History: The patient is a 70 year old female born [birth year]. She is scheduled to undergo a repeat screening colonoscopy today.  Past medical history: Tonsillectomy. Tubal ligation. 02/19/2015 inactive chronic hepatitis B carrier diagnosed at [facility] hepat[facility] center. Depression.  Medication allergies: Penicillin causes rash  Exam: The patient is alert and lying comfortably on the endoscopy stretcher. Abdomen is soft and nontender to palpation. Lungs are clear to auscultation. Cardiac exam reveals a regular rhythm.  Plan: Proceed with screening colonoscopy

## 2015-12-29 NOTE — Anesthesia Postprocedure Evaluation (Signed)
Anesthesia Post Note  Patient: Brooke Huber  Procedure(s) Performed: Procedure(s) (LRB): COLONOSCOPY WITH PROPOFOL (N/A)  Patient location during evaluation: Endoscopy Anesthesia Type: MAC Level of consciousness: oriented, patient cooperative and awake and alert Pain management: pain level controlled Vital Signs Assessment: post-procedure vital signs reviewed and stable Respiratory status: spontaneous breathing, nonlabored ventilation and respiratory function stable Cardiovascular status: blood pressure returned to baseline and stable Postop Assessment: no signs of nausea or vomiting Anesthetic complications: no    Last Vitals:  Vitals:   12/29/15 1041 12/29/15 1045  BP: 110/72   Pulse: (!) 47 (!) 49  Resp: 14 (!) 21  Temp:      Last Pain:  Vitals:   12/29/15 1015  TempSrc: Oral                 Jabier Deese,E. Sinclaire Artiga

## 2015-12-29 NOTE — Transfer of Care (Signed)
Immediate Anesthesia Transfer of Care Note  Patient: Brooke Huber  Procedure(s) Performed: Procedure(s): COLONOSCOPY WITH PROPOFOL (N/A)  Patient Location: Endoscopy Unit  Anesthesia Type:MAC  Level of Consciousness: awake  Airway & Oxygen Therapy: Patient Spontanous Breathing and Patient connected to face mask oxygen  Post-op Assessment: Report given to RN and Post -op Vital signs reviewed and stable  Post vital signs: Reviewed and stable  Last Vitals:  Vitals:   12/29/15 0826  BP: (!) 155/55  Pulse: (!) 54  Resp: 15  Temp: 36.9 C    Last Pain:  Vitals:   12/29/15 0826  TempSrc: Oral         Complications: No apparent anesthesia complications

## 2015-12-29 NOTE — Op Note (Addendum)
[facility] Patient Name: Brooke Huber Procedure Date: 12/29/2015 MRN: [id number] Attending MD: Garlan Fair , MD Date of Birth: 26-Dec-1945 CSN: [id number] Age: 70 Admit Type: Outpatient Procedure:                Colonoscopy Indications:              Screening for colorectal malignant neoplasm Providers:                Garlan Fair, MD, Hilma Favors, RN, Despina Pole Tech, Technician, Danley Danker, CRNA Referring MD:              Medicines:                Propofol per Anesthesia Complications:            No immediate complications. Estimated Blood Loss:     Estimated blood loss: none. Procedure:                Pre-Anesthesia Assessment:                           - Prior to the procedure, a History and Physical                            was performed, and patient medications and                            allergies were reviewed. The patient's tolerance of                            previous anesthesia was also reviewed. The risks                            and benefits of the procedure and the sedation                            options and risks were discussed with the patient.                            All questions were answered, and informed consent                            was obtained. Prior Anticoagulants: The patient has                            taken no previous anticoagulant or antiplatelet                            agents. ASA Grade Assessment: II - A patient with                            mild systemic disease. After reviewing the risks  and benefits, the patient was deemed in                            satisfactory condition to undergo the procedure.                           After obtaining informed consent, the colonoscope                            was passed under direct vision. Throughout the                            procedure, the patient's blood pressure, pulse, and                          oxygen saturations were monitored continuously. The                            [id number]) scope was introduced through                            the anus and advanced to the the cecum, identified                            by appendiceal orifice and ileocecal valve. The                            colonoscopy was somewhat difficult due to                            significant looping. The patient tolerated the                            procedure well. The quality of the bowel                            preparation was good. The appendiceal orifice and                            the rectum were photographed. Scope In: 9:32:51 AM Scope Out: 10:02:21 AM Scope Withdrawal Time: 0 hours 21 minutes 43 seconds  Total Procedure Duration: 0 hours 29 minutes 30 seconds  Findings:      The perianal and digital rectal examinations were normal.      A 4 mm polyp was found in the proximal ascending colon. The polyp was       sessile. The polyp was removed with a cold biopsy forceps. Resection and       retrieval were complete.      A 7 mm polyp was found in the distal ascending colon. The polyp was       sessile. The polyp was removed with a cold snare. Resection and       retrieval were complete.      Multiple small and large-mouthed diverticula were found in the sigmoid       colon.      The  exam was otherwise without abnormality. Impression:               - One 4 mm polyp in the proximal ascending colon,                            removed with a cold biopsy forceps. Resected and                            retrieved.                           - One 7 mm polyp in the distal ascending colon on a                            fold, removed with a cold snare in piecemeal                            fashion. Resected and retrieved.                           - Diverticulosis in the sigmoid colon.                           - The examination was otherwise normal. Moderate  Sedation:      N/A- Per Anesthesia Care Recommendation:           - Patient has a contact number available for                            emergencies. The signs and symptoms of potential                            delayed complications were discussed with the                            patient. Return to normal activities tomorrow.                            Written discharge instructions were provided to the                            patient.                           - Repeat colonoscopy date to be determined after                            pending pathology results are reviewed for                            surveillance.                           - Resume previous diet.                           -  Continue present medications. Procedure Code(s):        --- Professional ---                           [phone], Colonoscopy, flexible; with removal of                            tumor(s), polyp(s), or other lesion(s) by snare                            technique                           [id number], 15, Colonoscopy, flexible; with biopsy,                            single or multiple Diagnosis Code(s):        --- Professional ---                           Z12.11, Encounter for screening for malignant                            neoplasm of colon                           D12.2, Benign neoplasm of ascending colon                           K57.30, Diverticulosis of large intestine without                            perforation or abscess without bleeding CPT copyright 2016 American Medical Association. All rights reserved. The codes documented in this report are preliminary and upon coder review may  be revised to meet current compliance requirements. Earle Gell, MD Garlan Fair, MD 12/29/2015 10:10:33 AM This report has been signed electronically. Number of Addenda: 0

## 2015-12-29 NOTE — Discharge Instructions (Signed)

## 2015-12-29 NOTE — Anesthesia Preprocedure Evaluation (Addendum)
Anesthesia Evaluation  Patient identified by MRN, date of birth, ID band Patient awake    Reviewed: Allergy & Precautions, NPO status , [name] Chart, lab work & pertinent test results  History of Anesthesia Complications Negative for: history of anesthetic complications  Airway Mallampati: I  TM Distance: >3 FB Neck ROM: Full    Dental  (+) Edentulous Upper, Edentulous Lower   Pulmonary neg pulmonary ROS,    breath sounds clear to auscultation       Cardiovascular negative cardio ROS   Rhythm:Regular Rate:Normal     Neuro/Psych Seizures - (??),  PSYCHIATRIC DISORDERS (PTSD) Anxiety Depression    GI/Hepatic GERD  Controlled and Medicated,(+) Hepatitis -, B  Endo/Other  Morbid obesity  Renal/GU negative Renal ROS     Musculoskeletal  (+) Arthritis , Osteoarthritis,    Abdominal (+) + obese,   Peds  Hematology   Anesthesia Other Findings   Reproductive/Obstetrics                            Anesthesia Physical Anesthesia Plan  ASA: III  Anesthesia Plan: MAC   Post-op Pain Management:    Induction:   Airway Management Planned: Natural Airway  Additional Equipment:   Intra-op Plan:   Post-operative Plan:   Informed Consent: I have reviewed the patients History and Physical, chart, labs and discussed the procedure including the risks, benefits and alternatives for the proposed anesthesia with the patient or authorized representative who has indicated his/her understanding and acceptance.   Dental advisory given  Plan Discussed with: CRNA and Surgeon  Anesthesia Plan Comments: (Plan routine monitors, MAC)        Anesthesia Quick Evaluation

## 2015-12-30 ENCOUNTER — Encounter (HOSPITAL_COMMUNITY): Payer: Self-pay | Admitting: Gastroenterology

## 2016-02-04 ENCOUNTER — Ambulatory Visit (INDEPENDENT_AMBULATORY_CARE_PROVIDER_SITE_OTHER): Payer: PPO | Admitting: Psychiatry

## 2016-02-04 ENCOUNTER — Encounter (HOSPITAL_COMMUNITY): Payer: Self-pay | Admitting: Psychiatry

## 2016-02-04 DIAGNOSIS — Z79899 Other long term (current) drug therapy: Secondary | ICD-10-CM

## 2016-02-04 DIAGNOSIS — F410 Panic disorder [episodic paroxysmal anxiety] without agoraphobia: Secondary | ICD-10-CM | POA: Diagnosis not present

## 2016-02-04 DIAGNOSIS — Z813 Family history of other psychoactive substance abuse and dependence: Secondary | ICD-10-CM | POA: Diagnosis not present

## 2016-02-04 DIAGNOSIS — Z811 Family history of alcohol abuse and dependence: Secondary | ICD-10-CM

## 2016-02-04 DIAGNOSIS — F4310 Post-traumatic stress disorder, unspecified: Secondary | ICD-10-CM | POA: Diagnosis not present

## 2016-02-04 DIAGNOSIS — Z818 Family history of other mental and behavioral disorders: Secondary | ICD-10-CM

## 2016-02-04 DIAGNOSIS — F333 Major depressive disorder, recurrent, severe with psychotic symptoms: Secondary | ICD-10-CM

## 2016-02-04 MED ORDER — SERTRALINE HCL 50 MG PO TABS: 50.0000 mg | ORAL_TABLET | Freq: Every day | ORAL | 1 refills | Status: DC

## 2016-02-04 MED ORDER — RISPERIDONE 0.5 MG PO TABS: 0.5000 mg | ORAL_TABLET | Freq: Every day | ORAL | 1 refills | Status: DC

## 2016-02-04 NOTE — Progress Notes (Signed)
Patient ID: Brooke Huber, female   DOB: 09-12-1945, 71 y.o.   MRN: [id number]  [place] [address] Progress Note  Brooke Huber [id number] 70 y.o.  02/04/2016 10:46 AM  Chief Complaint: "just fine"  History of Present Illness: reviewed information with patient on 02/04/16  and same as previous visits except as noted  Overall doing well and has no concerns today. Spend time with her other son or visiting friends or going to [place]. Her biggest compliant was finding our new office today.   Depression is more situational. Pt denies depression. Denies anhedonia, isolation, crying spells, low motivation, poor hygiene, worthlessness and hopelessness. Denies SI/HI.  Sleep is good. She is getting about 5-6 hrs/night. Appetite and energy are fair.    PTSD- reports it is trigger induced by stress and her son (when he is actively drinking and abusing drugs). Her usual response is to get irritable. Denies nightmares, intrusive memories and flashbacks. States it has now almost resolved.   Pt denies stress induced panic attack.   Taking meds as prescribed and denies SE.   Suicidal Ideation: No Plan Formed: No Patient has means to carry out plan: No  Homicidal Ideation: No Plan Formed: No Patient has means to carry out plan: No  Review of Systems: Psychiatric: Agitation: No Hallucination: No Depressed Mood: No Insomnia: No Hypersomnia: No Altered Concentration: No Feels Worthless: No Grandiose Ideas: No Belief In Special Powers: No New/Increased Substance Abuse: No Compulsions: No  Review of Systems  Gastrointestinal: Negative for abdominal pain, heartburn, nausea and vomiting.  Musculoskeletal: Positive for joint pain. Negative for back pain, falls and neck pain.  Neurological: Negative for dizziness, tremors, sensory change, seizures, loss of consciousness and headaches.  Psychiatric/Behavioral: Negative for depression, hallucinations, memory loss, substance  abuse and suicidal ideas. The patient is not nervous/anxious and does not have insomnia.      Past Medical,  Family, Social History: reviewed information with patient on 02/04/16  and same as previous visits except as noted  divorced, mother of 3. Living alone. States that she was married twice the first marriage lasted 57 years and and she divorced in the second one lasted for a month. Patient has worked at multiple jobs and went on [employer] and is now on [employer].  reports that she has never smoked. She has never used smokeless tobacco. She reports that she does not drink alcohol or use drugs.  Past Medical History:  Diagnosis Date  . 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 Depression   . GERD (gastroesophageal reflux disease)   . Hepatitis   . Hepatitis B 02/1995  . Seizures ([place])    hx of seizure in 1993 and 1994 - caused by stress per patient    Family History  Problem Relation Age of Onset  . Alzheimer's disease Sister   . Dementia Sister   . Sexual abuse Sister   . Seizures Sister   . Alcohol abuse Brother   . Alcohol abuse Maternal Uncle   . Drug abuse Cousin   . Drug abuse Son   . Depression Neg Hx     Outpatient Encounter Prescriptions as of 02/04/2016  Medication Sig  . Calcium Carb-Cholecalciferol 600-100 MG-UNIT CAPS Take 1 tablet by mouth daily.  . risperiDONE (RISPERDAL) 0.5 MG tablet Take 1 tablet (0.5 mg total) by mouth at bedtime.  . sertraline (ZOLOFT) 50 MG tablet Take 1 tablet (50 mg total) by mouth daily.  . vitamin C (ASCORBIC  ACID) 500 MG tablet Take 500 mg by mouth daily.   No facility-administered encounter medications on file as of 02/04/2016.     Past Psychiatric History/Hospitalization(s): Anxiety: Yes Bipolar Disorder: No Depression: Yes Mania: No Psychosis: Yes Schizophrenia: No Personality Disorder: No Hospitalization for psychiatric illness: Yes [facility] from 06/09/2014 - 06/13/2014 For depression confusion and auditory  hallucinations. Patient was hospitalized 3 others times in 1993, 1994 for suicidal ideation and and 1997 after her divorce. History of Electroconvulsive Shock Therapy: No Prior Suicide Attempts: No  Physical Exam: Constitutional:  BP 130/78   Pulse 62   Ht 5\' 3"  (1.6 m)   Wt 198 lb 3.2 oz (89.9 kg)   BMI 35.11 kg/m   General Appearance: alert, oriented, no acute distress  Musculoskeletal: Strength & Muscle Tone: within normal limits Gait & Station: normal Patient leans: straight  Mental Status Examination/Evaluation: reviewed MSE on 02/04/16  and same as previous visits except as noted  Objective: Attitude: Calm and cooperative  Appearance: Casual, appears to be stated age  Eye Contact::  Fair  Speech:  Clear and Coherent and Slow, gives short answers to question  Volume:  Decreased  Mood:  euthymic  Affect:  Constricted  Thought Process:  slowed  Orientation:  Full (Time, Place, and Person)  Thought Content:  Negative  Suicidal Thoughts:  No  Homicidal Thoughts:  No  Judgement:  Fair  Insight:  Shallow  Concentration: good  Memory: Immediate-fair to poor Recent-fair to poor Remote-fair  Recall: fair  Language: fair  Gait and Station: normal  [employer] of Knowledge: average  Psychomotor Activity:  Decreased  Akathisia:  No  Handed:  Right  AIMS (if indicated):  Facial and Oral Movements  Muscles of Facial Expression: None, normal  Lips and Perioral Area: None, normal  Jaw: None, normal  Tongue: None, normal Extremity Movements: Upper (arms, wrists, hands, fingers): None, normal  Lower (legs, knees, ankles, toes): None, normal,  Trunk Movements:  Neck, shoulders, hips: None, normal,  Overall Severity : Severity of abnormal movements (highest score from questions above): None, normal  Incapacitation due to abnormal movements: None, normal  [name] awareness of abnormal movements (rate only patient's report): No Awareness, Dental Status  Current problems  with teeth and/or dentures?: No  Does patient usually wear dentures?: No    Assets:  Communication Skills Desire for Improvement       reviewed A&P on 02/04/16  and same as previous visits except as noted  Assessment: MDD- severe with psychosis; PTSD; Panic disorder   Medication management #1 patient will be treated for her depression and anxiety with Zoloft 50mg  by mouth every a.m. .  #2 PTSD will be treated with Zoloft and Risperdal 0.5mg  po qHS for psychosis #3 psychosis- Continue Risperdal 0.5mg  qHS. Encouraged daily compliance with Risperdal  Medication management with supportive therapy. Risks/benefits and SE of the medication discussed. Pt verbalized understanding and verbal consent obtained for treatment.  Affirm with the patient that the medications are taken as ordered. Patient expressed understanding of how their medications were to be used.  -improvement of symptoms  Labs: Feb 2017 CMP WNL, LDL elevated CBC, CMP, HbA1c, Lipid panel, TSH, Prolactin level, EKG    Therapy: brief supportive therapy provided. Discussed psychosocial stressors in detail.   Advised pt to monitor BP at home on regular basis and keep a log.  Pt denies SI and is at an acute low risk for suicide.Patient told to call clinic if any problems occur.  Patient advised to go to ER if they should develop SI/HI, side effects, or if symptoms worsen. Has crisis numbers to call if needed. Pt verbalized understanding.  F/up in 3-4 months or sooner if needed   Charlcie Cradle, MD 02/04/2016

## 2016-03-22 DIAGNOSIS — E7800 Pure hypercholesterolemia, unspecified: Secondary | ICD-10-CM | POA: Diagnosis not present

## 2016-03-22 DIAGNOSIS — Z0000 Encounter for general adult medical examination without abnormal findings: Secondary | ICD-10-CM | POA: Diagnosis not present

## 2016-03-28 DIAGNOSIS — Z1231 Encounter for screening mammogram for malignant neoplasm of breast: Secondary | ICD-10-CM | POA: Diagnosis not present

## 2016-06-02 ENCOUNTER — Encounter (INDEPENDENT_AMBULATORY_CARE_PROVIDER_SITE_OTHER): Payer: Self-pay

## 2016-06-02 ENCOUNTER — Encounter (HOSPITAL_COMMUNITY): Payer: Self-pay | Admitting: Psychiatry

## 2016-06-02 ENCOUNTER — Ambulatory Visit (INDEPENDENT_AMBULATORY_CARE_PROVIDER_SITE_OTHER): Payer: PPO | Admitting: Psychiatry

## 2016-06-02 DIAGNOSIS — F4310 Post-traumatic stress disorder, unspecified: Secondary | ICD-10-CM

## 2016-06-02 DIAGNOSIS — F410 Panic disorder [episodic paroxysmal anxiety] without agoraphobia: Secondary | ICD-10-CM

## 2016-06-02 DIAGNOSIS — F333 Major depressive disorder, recurrent, severe with psychotic symptoms: Secondary | ICD-10-CM

## 2016-06-02 DIAGNOSIS — Z813 Family history of other psychoactive substance abuse and dependence: Secondary | ICD-10-CM

## 2016-06-02 DIAGNOSIS — Z79899 Other long term (current) drug therapy: Secondary | ICD-10-CM

## 2016-06-02 DIAGNOSIS — Z811 Family history of alcohol abuse and dependence: Secondary | ICD-10-CM

## 2016-06-02 DIAGNOSIS — Z810 Family history of intellectual disabilities: Secondary | ICD-10-CM | POA: Diagnosis not present

## 2016-06-02 MED ORDER — SERTRALINE HCL 50 MG PO TABS: 50.0000 mg | ORAL_TABLET | Freq: Every day | ORAL | 1 refills | Status: DC

## 2016-06-02 MED ORDER — RISPERIDONE 0.5 MG PO TABS: 0.5000 mg | ORAL_TABLET | Freq: Every day | ORAL | 1 refills | Status: DC

## 2016-06-02 NOTE — Progress Notes (Signed)
[facility] MD/PA/NP OP Progress Note  06/02/2016 10:30 AM Brooke Huber  MRN:  [id number]  Chief Complaint:  Chief Complaint    Follow-up      HPI: Depression is present but managable. States she has a very low stress tolerance. She feels down 1-2x/month for a few hours. Denies anhedonia, worthlessness and hopelessness. Denies SI/HI/AVH.   Anxiety is high when she has to deal with change. Pt denies any recent panic attacks.  Sleeping about 6 hrs/night. Energy and appetite are good.   PTSD- denies intrusive memories, HV and nightmares.  Taking meds as prescribed and denies SE.       Visit Diagnosis:    ICD-9-CM ICD-10-CM   1. Severe episode of recurrent major depressive disorder, with psychotic features ([name]) 296.34 F33.3 sertraline (ZOLOFT) 50 MG tablet     risperiDONE (RISPERDAL) 0.5 MG tablet  2. PTSD (post-traumatic stress disorder) 309.81 F43.10 sertraline (ZOLOFT) 50 MG tablet     risperiDONE (RISPERDAL) 0.5 MG tablet  3. Panic attacks 300.01 F41.0 sertraline (ZOLOFT) 50 MG tablet  4. Encounter for long-term (current) use of medications V58.69 Z79.899     Past Psychiatric History:  Anxiety: Yes Bipolar Disorder: No Depression: Yes Mania: No Psychosis: Yes Schizophrenia: No Personality Disorder: No Hospitalization for psychiatric illness: Yes [facility] from 06/09/2014 - 06/13/2014 For depression confusion and auditory hallucinations. Patient was hospitalized 3 others times in 1993, 1994 for suicidal ideation and and 1997 after her divorce. History of Electroconvulsive Shock Therapy: No Prior Suicide Attempts: No   Past Medical History:  Past Medical History:  Diagnosis Date  . 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 Depression   . GERD (gastroesophageal reflux disease)   . Hepatitis   . Hepatitis B 02/1995  . Seizures ([place])    hx of seizure in 1993 and 1994 - caused by stress per patient     Past Surgical History:  Procedure Laterality Date  .  APPENDECTOMY    . COLONOSCOPY WITH PROPOFOL N/A 12/29/2015   Procedure: COLONOSCOPY WITH PROPOFOL;  Surgeon: Garlan Fair, MD;  Location: [facility];  Service: Endoscopy;  Laterality: N/A;  . TONSILLECTOMY AND ADENOIDECTOMY Bilateral 1954  . TUBAL LIGATION      Family Psychiatric and Medical History: Family History  Problem Relation Age of Onset  . Alzheimer's disease Sister   . Dementia Sister   . Sexual abuse Sister   . Seizures Sister   . Alcohol abuse Brother   . Alcohol abuse Maternal Uncle   . Drug abuse Cousin   . Drug abuse Son   . Depression Neg Hx     Social History:  Social History   Social History  . Marital status: Single    Spouse name: N/A  . Number of children: N/A  . Years of education: N/A   Social History Main Topics  . Smoking status: Never Smoker  . Smokeless tobacco: Never Used  . Alcohol use No  . Drug use: No  . Sexual activity: No   Other Topics Concern  . None   Social History Narrative  . None    Allergies:  Allergies  Allergen Reactions  . Penicillins Hives and Rash    Metabolic Disorder Labs: Lab Results  Component Value Date   HGBA1C 5.4 11/10/2015   MPG 108 11/10/2015   MPG 123 06/10/2014   Lab Results  Component Value Date   PROLACTIN 22.1 11/10/2015   Lab Results  Component Value Date  CHOL 128 06/10/2014   TRIG 70 06/10/2014   HDL 47 06/10/2014   CHOLHDL 2.7 06/10/2014   VLDL 14 06/10/2014   LDLCALC 67 06/10/2014     Current Medications: Current Outpatient Prescriptions  Medication Sig Dispense Refill  . Calcium Carb-Cholecalciferol 600-100 MG-UNIT CAPS Take 1 tablet by mouth daily.    . risperiDONE (RISPERDAL) 0.5 MG tablet Take 1 tablet (0.5 mg total) by mouth at bedtime. 90 tablet 1  . sertraline (ZOLOFT) 50 MG tablet Take 1 tablet (50 mg total) by mouth daily. 90 tablet 1  . vitamin C (ASCORBIC ACID) 500 MG tablet Take 500 mg by mouth daily.     No current facility-administered medications for  this visit.       Musculoskeletal: Strength & Muscle Tone: within normal limits Gait & Station: normal Patient leans: N/A  Psychiatric Specialty Exam: Review of Systems  Gastrointestinal: Negative for abdominal pain, heartburn, nausea and vomiting.  Musculoskeletal: Negative for back pain, falls, joint pain and neck pain.  Neurological: Negative for dizziness, tingling, tremors and headaches.  Psychiatric/Behavioral: Positive for depression. Negative for hallucinations, substance abuse and suicidal ideas. The patient is nervous/anxious. The patient does not have insomnia.     Blood pressure 118/74, pulse 63, height 5\' 3"  (1.6 m), weight 193 lb (87.5 kg), SpO2 95 %.Body mass index is 34.19 kg/m.  General Appearance: Fairly Groomed  Eye Contact:  Good  Speech:  Clear and Coherent and Normal Rate  Volume:  Normal  Mood:  Anxious and Depressed  Affect:  Constricted- brighter and calmer today as compared to previous visits  Thought Process:  Goal Directed and Descriptions of Associations: Intact  Orientation:  Full (Time, Place, and Person)  Thought Content: Logical   Suicidal Thoughts:  No  Homicidal Thoughts:  No  Memory:  Immediate;   Good Recent;   Good Remote;   Good  Judgement:  Good  Insight:  Good  Psychomotor Activity:  Normal  Concentration:  Concentration: Good  Recall:  Good  Fund of Knowledge: Good  Language: Good  Akathisia:  No  Handed:  Right  AIMS (if indicated):  AIMS:  Facial and Oral Movements  Muscles of Facial Expression: None, normal  Lips and Perioral Area: None, normal  Jaw: None, normal  Tongue: None, normal Extremity Movements: Upper (arms, wrists, hands, fingers): None, normal  Lower (legs, knees, ankles, toes): None, normal,  Trunk Movements:  Neck, shoulders, hips: None, normal,  Overall Severity : Severity of abnormal movements (highest score from questions above): None, normal  Incapacitation due to abnormal movements: None, normal   [name] awareness of abnormal movements (rate only patient's report): No Awareness, Dental Status  Current problems with teeth and/or dentures?: No  Does patient usually wear dentures?: No     Assets:  Communication Skills Desire for [place] [employer]/Skills Transportation  ADL's:  Intact  Cognition: WNL  Sleep:  good     Treatment Plan Summary:Medication management   Assessment: MDD-severe with psychotic features; PTSD; Panic disorder   Medication management with supportive therapy. Risks/benefits and SE of the medication discussed. Pt verbalized understanding and verbal consent obtained for treatment.  Affirm with the patient that the medications are taken as ordered. Patient expressed understanding of how their medications were to be used.   Meds: Zoloft 50mg  po qD for depression, PTSD and panic attacks Risperdal 0.5mg  po qHS for psychosis    Labs: none  Therapy: brief supportive therapy provided. Discussed psychosocial stressors in detail.  Consultations: none  Pt denies SI and is at an acute low risk for suicide. Patient told to call clinic if any problems occur. Patient advised to go to ER if they should develop SI/HI, side effects, or if symptoms worsen. Has crisis numbers to call if needed. Pt verbalized understanding.  F/up in 4 months or sooner if needed    Charlcie Cradle, MD 06/02/2016, 10:30 AM

## 2016-07-25 DIAGNOSIS — B181 Chronic viral hepatitis B without delta-agent: Secondary | ICD-10-CM | POA: Diagnosis not present

## 2016-07-27 ENCOUNTER — Other Ambulatory Visit (HOSPITAL_COMMUNITY): Payer: Self-pay | Admitting: Gastroenterology

## 2016-07-27 DIAGNOSIS — Z1289 Encounter for screening for malignant neoplasm of other sites: Secondary | ICD-10-CM

## 2016-07-27 DIAGNOSIS — B181 Chronic viral hepatitis B without delta-agent: Secondary | ICD-10-CM

## 2016-08-04 ENCOUNTER — Ambulatory Visit (HOSPITAL_COMMUNITY)
Admission: RE | Admit: 2016-08-04 | Discharge: 2016-08-04 | Disposition: A | Discharge status: Home / Self Care | Payer: PPO | Source: Ambulatory Visit | Attending: Gastroenterology | Admitting: Gastroenterology

## 2016-08-04 DIAGNOSIS — B181 Chronic viral hepatitis B without delta-agent: Secondary | ICD-10-CM

## 2016-08-04 DIAGNOSIS — K829 Disease of gallbladder, unspecified: Secondary | ICD-10-CM | POA: Diagnosis not present

## 2016-08-04 DIAGNOSIS — K7689 Other specified diseases of liver: Secondary | ICD-10-CM | POA: Insufficient documentation

## 2016-08-04 DIAGNOSIS — Z1289 Encounter for screening for malignant neoplasm of other sites: Secondary | ICD-10-CM | POA: Diagnosis present

## 2016-10-06 ENCOUNTER — Ambulatory Visit (INDEPENDENT_AMBULATORY_CARE_PROVIDER_SITE_OTHER): Payer: PPO | Admitting: Psychiatry

## 2016-10-06 ENCOUNTER — Encounter (HOSPITAL_COMMUNITY): Payer: Self-pay | Admitting: Psychiatry

## 2016-10-06 VITALS — HR 64 | Ht 62.0 in | Wt 192.8 lb

## 2016-10-06 DIAGNOSIS — Z79899 Other long term (current) drug therapy: Secondary | ICD-10-CM | POA: Diagnosis not present

## 2016-10-06 DIAGNOSIS — Z818 Family history of other mental and behavioral disorders: Secondary | ICD-10-CM | POA: Diagnosis not present

## 2016-10-06 DIAGNOSIS — F4310 Post-traumatic stress disorder, unspecified: Secondary | ICD-10-CM

## 2016-10-06 DIAGNOSIS — F333 Major depressive disorder, recurrent, severe with psychotic symptoms: Secondary | ICD-10-CM

## 2016-10-06 DIAGNOSIS — Z810 Family history of intellectual disabilities: Secondary | ICD-10-CM | POA: Diagnosis not present

## 2016-10-06 DIAGNOSIS — Z811 Family history of alcohol abuse and dependence: Secondary | ICD-10-CM | POA: Diagnosis not present

## 2016-10-06 DIAGNOSIS — Z813 Family history of other psychoactive substance abuse and dependence: Secondary | ICD-10-CM | POA: Diagnosis not present

## 2016-10-06 DIAGNOSIS — F410 Panic disorder [episodic paroxysmal anxiety] without agoraphobia: Secondary | ICD-10-CM

## 2016-10-06 MED ORDER — SERTRALINE HCL 50 MG PO TABS: 50.0000 mg | ORAL_TABLET | Freq: Every day | ORAL | 1 refills | Status: DC

## 2016-10-06 MED ORDER — RISPERIDONE 0.5 MG PO TABS: 0.5000 mg | ORAL_TABLET | Freq: Every day | ORAL | 1 refills | Status: DC

## 2016-10-06 NOTE — Progress Notes (Signed)
[facility] MD/PA/NP OP Progress Note  10/06/2016 10:17 AM Brooke Huber  MRN:  [id number]  Chief Complaint:  Chief Complaint    Follow-up     HPI: "doing ok". Pt states her depression is better. Her crying spells have decreased. She now only gets depressed in situations where she hears bad news from the family. On those days she worries and feels down. She will feel that way for 1-2 days then it gets better. She has been spending time with friends, family and at the [place]. Pt denies anhedonia, worthlessness and hopelessness. Pt denies SI/HI.  Sleep is better. She is mostly sleeping well most night but 1-2x/week she will wake up early and have a hard time falling asleep. Pt states energy and appetite are good.  New places or situations will cause panic attacks. Anxiety is mild and tolerable on other days.  Pt denies symptoms of PTSD.  Taking meds as prescribed and denies SE.   Visit Diagnosis:    ICD-10-CM   1. Encounter for long-term (current) use of medications Z79.899 EKG  2. Severe episode of recurrent major depressive disorder, with psychotic features ([place]) F33.3 risperiDONE (RISPERDAL) 0.5 MG tablet    sertraline (ZOLOFT) 50 MG tablet  3. PTSD (post-traumatic stress disorder) F43.10 risperiDONE (RISPERDAL) 0.5 MG tablet    sertraline (ZOLOFT) 50 MG tablet  4. Panic attacks F41.0 sertraline (ZOLOFT) 50 MG tablet      Past Psychiatric History:  Anxiety:Yes Bipolar Disorder:No Depression:Yes Mania:No Psychosis:Yes Schizophrenia:No Personality Disorder:No [facility] for psychiatric illness[facility] For depression confusion and auditory hallucinations. Patient was hospitalized 3 others times in 1993, 1994 for suicidal ideation and and 1997 after her divorce. History of Electroconvulsive Shock Therapy:No Prior Suicide Attempts:No  Past Medical History:  Past Medical History:  Diagnosis Date  . 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 Depression   . GERD (gastroesophageal reflux disease)   . Hepatitis   . Hepatitis B 02/1995  . Seizures ([place])    hx of seizure in 1993 and 1994 - caused by stress per patient     Past Surgical History:  Procedure Laterality Date  . APPENDECTOMY    . COLONOSCOPY WITH PROPOFOL N/A 12/29/2015   Procedure: COLONOSCOPY WITH PROPOFOL;  Surgeon: Garlan Fair, MD;  Location: [facility];  Service: Endoscopy;  Laterality: N/A;  . TONSILLECTOMY AND ADENOIDECTOMY Bilateral 1954  . TUBAL LIGATION      Family Psychiatric History: Family History  Problem Relation Age of Onset  . Alzheimer's disease Sister   . Dementia Sister   . Sexual abuse Sister   . Seizures Sister   . Alcohol abuse Brother   . Alcohol abuse Maternal Uncle   . Drug abuse Cousin   . Drug abuse Son   . Depression Neg Hx     Social History:  Social History   Social History  . Marital status: Single    Spouse name: N/A  . Number of children: N/A  . Years of education: N/A   Social History Main Topics  . Smoking status: Never Smoker  . Smokeless tobacco: Never Used  . Alcohol use No  . Drug use: No  . Sexual activity: No   Other Topics Concern  . Not on file   Social History Narrative  . No narrative on file    Allergies:  Allergies  Allergen Reactions  . Penicillins Hives and Rash    Metabolic Disorder Labs: Lab Results  Component Value Date   HGBA1C 5.4 11/10/2015   MPG 108 11/10/2015   MPG 123 06/10/2014   Lab Results  Component Value Date   PROLACTIN 22.1 11/10/2015   Lab Results  Component Value Date   CHOL 128 06/10/2014   TRIG 70 06/10/2014   HDL 47 06/10/2014   CHOLHDL 2.7 06/10/2014   VLDL 14 06/10/2014   LDLCALC 67 06/10/2014   Lab Results  Component Value Date   TSH 1.42 11/10/2015   TSH 2.117 06/09/2014    Therapeutic Level Labs: No results found for: LITHIUM No results found for: VALPROATE No components found for:  CBMZ  Current  Medications: Current Outpatient Prescriptions  Medication Sig Dispense Refill  . Calcium Carb-Cholecalciferol 600-100 MG-UNIT CAPS Take 1 tablet by mouth daily.    . risperiDONE (RISPERDAL) 0.5 MG tablet Take 1 tablet (0.5 mg total) by mouth at bedtime. 90 tablet 1  . sertraline (ZOLOFT) 50 MG tablet Take 1 tablet (50 mg total) by mouth daily. 90 tablet 1  . vitamin C (ASCORBIC ACID) 500 MG tablet Take 500 mg by mouth daily.     No current facility-administered medications for this visit.      Musculoskeletal: Strength & Muscle Tone: within normal limits Gait & Station: normal Patient leans: N/A  Psychiatric Specialty Exam: Review of Systems  Neurological: Positive for sensory change. Negative for dizziness, tingling and headaches.       On/off Left arm pain  Psychiatric/Behavioral: Negative for depression, hallucinations, substance abuse and suicidal ideas. The patient is not nervous/anxious and does not have insomnia.     Pulse 64, height 5\' 2"  (1.575 m), weight 192 lb 12.8 oz (87.5 kg).Body mass index is 35.26 kg/m.  General Appearance: Casual  Eye Contact:  Good  Speech:  Clear and Coherent and Normal Rate  Volume:  Normal  Mood:  Euthymic  Affect:  Blunt  Thought Process:  Goal Directed and Descriptions of Associations: Intact  Orientation:  Full (Time, Place, and Person)  Thought Content: Logical   Suicidal Thoughts:  No  Homicidal Thoughts:  No  Memory:  Immediate;   Good Recent;   Good Remote;   Good  Judgement:  Good  Insight:  Good  Psychomotor Activity:  Normal  Concentration:  Concentration: Good and Attention Span: Good  Recall:  Good  Fund of Knowledge: Good  Language: Good  Akathisia:  No  Handed:  Right  AIMS (if indicated): done  Assets:  Communication Skills Desire for Improvement Financial Resources/Insurance Housing  ADL's:  Intact  Cognition: WNL  Sleep:  Fair   Screenings: AIMS     Admission (Discharged) from 06/08/2014 in [facility] Total Score  0    AUDIT     Admission (Discharged) from 06/08/2014 in [facility]  Alcohol Use Disorder Identification Test Final Score (AUDIT)  0       Assessment and Plan: MDD-severe with psychotic features; PTSD; Panic disorder without agoraphobia   Medication management with supportive therapy. Risks/benefits and SE of the medication discussed. Pt verbalized understanding and verbal consent obtained for treatment.  Affirm with the patient that the medications are taken as ordered. Patient expressed understanding of how their medications were to be used.   Meds: Zoloft 50mg  po qD for depression, PTSD and panic attacks Risperdal 0.5mg  po qHS for psychosis   Labs: ordered  CBC, CMP, HbA1c, Lipid panel, TSH, Prolactin level, EKG   Therapy: brief supportive  therapy provided. Discussed psychosocial stressors in detail.     Consultations: none   Pt denies SI and is at an acute low risk for suicide. Patient told to call clinic if any problems occur. Patient advised to go to ER if they should develop SI/HI, side effects, or if symptoms worsen. Has crisis numbers to call if needed. Pt verbalized understanding.  F/up in 4 months or sooner if needed    Charlcie Cradle, MD 10/06/2016, 10:17 AM

## 2016-10-06 NOTE — Addendum Note (Signed)
Addended by: Dennie Maizes E on: 10/06/2016 10:40 AM   Modules accepted: Orders

## 2016-10-07 LAB — LIPID PANEL W/O CHOL/HDL RATIO
Cholesterol, Total: 140 mg/dL (ref 100–199)
HDL: 38 mg/dL — ABNORMAL LOW (ref >39)
LDL Calculated: 86 mg/dL (ref 0–99)
Triglycerides: 81 mg/dL (ref 0–149)
VLDL CHOLESTEROL CAL: 16 mg/dL (ref 5–40)

## 2016-10-07 LAB — COMPREHENSIVE METABOLIC PANEL
ALBUMIN: 4.1 g/dL (ref 3.5–4.8)
ALK PHOS: 83 IU/L (ref 39–117)
ALT: 13 IU/L (ref 0–32)
AST: 25 IU/L (ref 0–40)
Albumin/Globulin Ratio: 1.7 (ref 1.2–2.2)
BILIRUBIN TOTAL: 0.8 mg/dL (ref 0.0–1.2)
BUN / CREAT RATIO: 12 (ref 12–28)
BUN: 14 mg/dL (ref 8–27)
CO2: 24 mmol/L (ref 20–29)
Calcium: 8.9 mg/dL (ref 8.7–10.3)
Chloride: 102 mmol/L (ref 96–106)
Creatinine, Ser: 1.18 mg/dL — ABNORMAL HIGH (ref 0.57–1.00)
GFR calc Af Amer: 54 mL/min/{1.73_m2} — ABNORMAL LOW (ref >59)
GFR calc non Af Amer: 47 mL/min/{1.73_m2} — ABNORMAL LOW (ref >59)
GLUCOSE: 85 mg/dL (ref 65–99)
Globulin, Total: 2.4 g/dL (ref 1.5–4.5)
Potassium: 4.4 mmol/L (ref 3.5–5.2)
Sodium: 141 mmol/L (ref 134–144)
Total Protein: 6.5 g/dL (ref 6.0–8.5)

## 2016-10-07 LAB — CBC WITH DIFFERENTIAL/PLATELET
BASOS ABS: 0.1 10*3/uL (ref 0.0–0.2)
Basos: 1 %
EOS (ABSOLUTE): 0.3 10*3/uL (ref 0.0–0.4)
Eos: 4 %
Hematocrit: 40.9 % (ref 34.0–46.6)
Hemoglobin: 13.3 g/dL (ref 11.1–15.9)
IMMATURE GRANULOCYTES: 0 %
Immature Grans (Abs): 0 10*3/uL (ref 0.0–0.1)
LYMPHS ABS: 1.3 10*3/uL (ref 0.7–3.1)
Lymphs: 18 %
MCH: 29.1 pg (ref 26.6–33.0)
MCHC: 32.5 g/dL (ref 31.5–35.7)
MCV: 90 fL (ref 79–97)
Monocytes Absolute: 1.5 10*3/uL — ABNORMAL HIGH (ref 0.1–0.9)
Monocytes: 20 %
NEUTROS PCT: 57 %
Neutrophils Absolute: 4.2 10*3/uL (ref 1.4–7.0)
Platelets: 146 10*3/uL — ABNORMAL LOW (ref 150–379)
RBC: 4.57 x10E6/uL (ref 3.77–5.28)
RDW: 14.3 % (ref 12.3–15.4)
WBC: 7.4 10*3/uL (ref 3.4–10.8)

## 2016-10-07 LAB — PROLACTIN: PROLACTIN: 37 ng/mL — AB (ref 4.8–23.3)

## 2016-10-07 LAB — TSH: TSH: 1.35 u[IU]/mL (ref 0.450–4.500)

## 2016-10-07 LAB — HEMOGLOBIN A1C
Est. average glucose Bld gHb Est-mCnc: 111 mg/dL
HEMOGLOBIN A1C: 5.5 % (ref 4.8–5.6)

## 2017-02-09 ENCOUNTER — Encounter (HOSPITAL_COMMUNITY): Payer: Self-pay | Admitting: Psychiatry

## 2017-02-09 ENCOUNTER — Ambulatory Visit (HOSPITAL_COMMUNITY): Payer: PPO | Admitting: Psychiatry

## 2017-02-09 VITALS — BP 130/74 | HR 66 | Ht 63.0 in | Wt 196.8 lb

## 2017-02-09 DIAGNOSIS — F4310 Post-traumatic stress disorder, unspecified: Secondary | ICD-10-CM | POA: Diagnosis not present

## 2017-02-09 DIAGNOSIS — F333 Major depressive disorder, recurrent, severe with psychotic symptoms: Secondary | ICD-10-CM | POA: Diagnosis not present

## 2017-02-09 DIAGNOSIS — Z810 Family history of intellectual disabilities: Secondary | ICD-10-CM | POA: Diagnosis not present

## 2017-02-09 DIAGNOSIS — F410 Panic disorder [episodic paroxysmal anxiety] without agoraphobia: Secondary | ICD-10-CM | POA: Diagnosis not present

## 2017-02-09 DIAGNOSIS — Z811 Family history of alcohol abuse and dependence: Secondary | ICD-10-CM | POA: Diagnosis not present

## 2017-02-09 DIAGNOSIS — Z813 Family history of other psychoactive substance abuse and dependence: Secondary | ICD-10-CM

## 2017-02-09 MED ORDER — SERTRALINE HCL 50 MG PO TABS: 50.0000 mg | ORAL_TABLET | Freq: Every day | ORAL | 1 refills | Status: DC

## 2017-02-09 MED ORDER — RISPERIDONE 0.5 MG PO TABS: 0.5000 mg | ORAL_TABLET | Freq: Every day | ORAL | 1 refills | Status: DC

## 2017-02-09 NOTE — Progress Notes (Signed)
BH MD/PA/NP OP Progress Note  02/09/2017 10:24 AM Brooke Huber  MRN:  [id number]  Chief Complaint:  Chief Complaint    Follow-up     HPI: "i'm doing just fine". Pt denies depression, crying spells, anhedonia, hopelessness. Pt denies SI/HI/AVH.  Sleep is good and she is getting about 6-7 hrs/night. Energy is good. Appetite is good and she is eating 3 meals/day.   Pt has rare panic attacks when she has to do something new or stressed. She gives the example of going to a new doctor and having to find the office for the first time.  PTSD- pt denies nightmares, HV and intrusive memories.  Pt states-taking meds as prescribed and denies SE.   Visit Diagnosis:    ICD-10-CM   1. 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 Severe episode of recurrent major depressive disorder, with psychotic features ([place]) F33.3 risperiDONE (RISPERDAL) 0.5 MG tablet    sertraline (ZOLOFT) 50 MG tablet  3. PTSD (post-traumatic stress disorder) F43.10 risperiDONE (RISPERDAL) 0.5 MG tablet    sertraline (ZOLOFT) 50 MG tablet      Past Psychiatric History:  Anxiety: Yes Bipolar Disorder: No Depression: Yes Mania: No Psychosis: Yes Schizophrenia: No Personality Disorder: No Hospitalization for psychiatric illness: Yes [facility] from 06/09/2014 - 06/13/2014 For depression confusion and auditory hallucinations. Patient was hospitalized 3 others times in 1993, 1994 for suicidal ideation and and 1997 after her divorce. History of Electroconvulsive Shock Therapy: No Prior Suicide Attempts: No   Past Medical History:  Past Medical History:  Diagnosis Date  . 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 Depression   . GERD (gastroesophageal reflux disease)   . Hepatitis   . Hepatitis B 02/1995  . Seizures ([place])    hx of seizure in 1993 and 1994 - caused by stress per patient     Past Surgical History:  Procedure Laterality Date  . APPENDECTOMY    .  COLONOSCOPY WITH PROPOFOL N/A 12/29/2015   Procedure: COLONOSCOPY WITH PROPOFOL;  Surgeon: Garlan Fair, MD;  Location: [facility];  Service: Endoscopy;  Laterality: N/A;  . TONSILLECTOMY AND ADENOIDECTOMY Bilateral 1954  . TUBAL LIGATION      Family Psychiatric History:  Family History  Problem Relation Age of Onset  . Alzheimer's disease Sister   . Dementia Sister   . Sexual abuse Sister   . Seizures Sister   . Alcohol abuse Brother   . Alcohol abuse Maternal Uncle   . Drug abuse Cousin   . Drug abuse Son   . Depression Neg Hx     Social History:  Social History   Socioeconomic History  . Marital status: Single    Spouse name: None  . Number of children: None  . Years of education: None  . Highest education level: None  Social Needs  . Financial resource strain: None  . Food insecurity - worry: None  . Food insecurity - inability: None  . Transportation needs - medical: None  . Transportation needs - non-medical: None  Occupational History  . None  Tobacco Use  . Smoking status: Never Smoker  . Smokeless tobacco: Never Used  Substance and Sexual Activity  . Alcohol use: No  . Drug use: No  . Sexual activity: No    Birth control/protection: None  Other Topics Concern  . None  Social History Narrative  . None    Allergies:  Allergies  Allergen Reactions  .  Penicillins Hives and Rash    Metabolic Disorder Labs: Lab Results  Component Value Date   HGBA1C 5.5 10/06/2016   MPG 108 11/10/2015   MPG 123 06/10/2014   Lab Results  Component Value Date   PROLACTIN 37.0 (H) 10/06/2016   PROLACTIN 22.1 11/10/2015   Lab Results  Component Value Date   CHOL 140 10/06/2016   TRIG 81 10/06/2016   HDL 38 (L) 10/06/2016   CHOLHDL 2.7 06/10/2014   VLDL 14 06/10/2014   LDLCALC 86 10/06/2016   LDLCALC 67 06/10/2014   Lab Results  Component Value Date   TSH 1.350 10/06/2016   TSH 1.42 11/10/2015    Therapeutic Level Labs: No results found for:  LITHIUM No results found for: VALPROATE No components found for:  CBMZ  Current Medications: Current Outpatient Medications  Medication Sig Dispense Refill  . Calcium Carb-Cholecalciferol 600-100 MG-UNIT CAPS Take 1 tablet by mouth daily.    . risperiDONE (RISPERDAL) 0.5 MG tablet Take 1 tablet (0.5 mg total) by mouth at bedtime. 90 tablet 1  . sertraline (ZOLOFT) 50 MG tablet Take 1 tablet (50 mg total) by mouth daily. 90 tablet 1  . vitamin C (ASCORBIC ACID) 500 MG tablet Take 500 mg by mouth daily.     No current facility-administered medications for this visit.      Musculoskeletal: Strength & Muscle Tone: within normal limits Gait & Station: normal Patient leans: N/A  Psychiatric Specialty Exam: Review of Systems  Musculoskeletal: Negative for back pain, falls, joint pain and neck pain.  Neurological: Positive for tingling. Negative for tremors, speech change and headaches.       Pt reports she has some tingling in finger and toes randomly. They will cramp and draw together.     Blood pressure 130/74, pulse 66, height 5\' 3"  (1.6 m), weight 196 lb 12.8 oz (89.3 kg).Body mass index is 34.86 kg/m.  General Appearance: Fairly Groomed  Eye Contact:  Good  Speech:  Clear and Coherent and Normal Rate  Volume:  Normal  Mood:  Euthymic  Affect:  Restricted  Thought Process:  Goal Directed and Descriptions of Associations: Intact  Orientation:  Full (Time, Place, and Person)  Thought Content: WDL and Logical   Suicidal Thoughts:  No  Homicidal Thoughts:  No  Memory:  Immediate;   Good Recent;   Good Remote;   Good  Judgement:  Good  Insight:  Good  Psychomotor Activity:  Normal  Concentration:  Concentration: Good and Attention Span: Good  Recall:  Good  Fund of Knowledge: Good  Language: Good  Akathisia:  No  Handed:  Right  AIMS (if indicated): not done  Assets:  Communication Skills Desire for Improvement Financial  Resources/Insurance Talents/Skills Transportation  ADL's:  Intact  Cognition: WNL  Sleep:  Good   Screenings: AIMS     Office Visit from 10/06/2016 in [facility] Admission (Discharged) from 06/08/2014 in [facility]  AIMS Total Score  0  0    AUDIT     Admission (Discharged) from 06/08/2014 in [facility]  Alcohol Use Disorder Identification Test Final Score (AUDIT)  0       Assessment and Plan: MDD-severe with psychotic features- stable; PTSD- stable; panic disorder without agoraphobia- stable   Medication management with supportive therapy. Risks/benefits and SE of the medication discussed. Pt verbalized understanding and verbal consent obtained for treatment.  Affirm with the patient that the medications are  taken as ordered. Patient expressed understanding of how their medications were to be used.   The risk of un-intended pregnancy is low based on the fact that pt reports she is postmenopausal. Pt is aware that these meds carry a teratogenic risk. Pt will discuss plan of action if she does or plans to become pregnant in the future.   Meds: Zoloft 50 mg p.o. daily for major depressive disorder, PTSD and panic disorder Risperdal 0.5 mg p.o. nightly for psychotic features related to depression   Labs: 10/06/2016: CMP creat 1.18, Lipid WNL, CBC WNL. Prolactin 37, TSH WNL- reviewed with pt Pt denies tenderness or enlargement in breasts, pt denies breast discharge  Therapy: brief supportive therapy provided. Discussed psychosocial stressors in detail.     Consultations: Encouraged to follow up with PCP as needed  Pt denies SI and is at an acute low risk for suicide. Patient told to call clinic if any problems occur. Patient advised to go to ER if they should develop SI/HI, side effects, or if symptoms worsen. Has crisis numbers to call if needed. Pt verbalized  understanding.  F/up in 4 months or sooner if needed    Charlcie Cradle, MD 02/09/2017, 10:24 AM

## 2017-02-12 ENCOUNTER — Other Ambulatory Visit (HOSPITAL_COMMUNITY): Payer: Self-pay | Admitting: Psychiatry

## 2017-02-12 DIAGNOSIS — F4310 Post-traumatic stress disorder, unspecified: Secondary | ICD-10-CM

## 2017-02-12 DIAGNOSIS — F333 Major depressive disorder, recurrent, severe with psychotic symptoms: Secondary | ICD-10-CM

## 2017-04-17 DIAGNOSIS — Z803 Family history of malignant neoplasm of breast: Secondary | ICD-10-CM | POA: Diagnosis not present

## 2017-04-17 DIAGNOSIS — F419 Anxiety disorder, unspecified: Secondary | ICD-10-CM | POA: Diagnosis not present

## 2017-04-17 DIAGNOSIS — Z0000 Encounter for general adult medical examination without abnormal findings: Secondary | ICD-10-CM | POA: Diagnosis not present

## 2017-04-17 DIAGNOSIS — E669 Obesity, unspecified: Secondary | ICD-10-CM | POA: Diagnosis not present

## 2017-04-17 DIAGNOSIS — M25511 Pain in right shoulder: Secondary | ICD-10-CM | POA: Diagnosis not present

## 2017-04-17 DIAGNOSIS — M8580 Other specified disorders of bone density and structure, unspecified site: Secondary | ICD-10-CM | POA: Diagnosis not present

## 2017-04-17 DIAGNOSIS — Z1231 Encounter for screening mammogram for malignant neoplasm of breast: Secondary | ICD-10-CM | POA: Diagnosis not present

## 2017-04-24 DIAGNOSIS — Z803 Family history of malignant neoplasm of breast: Secondary | ICD-10-CM | POA: Diagnosis not present

## 2017-04-24 DIAGNOSIS — R599 Enlarged lymph nodes, unspecified: Secondary | ICD-10-CM | POA: Diagnosis not present

## 2017-04-25 ENCOUNTER — Other Ambulatory Visit: Payer: Self-pay | Admitting: Radiology

## 2017-04-25 DIAGNOSIS — C8204 Follicular lymphoma grade I, lymph nodes of axilla and upper limb: Secondary | ICD-10-CM | POA: Diagnosis not present

## 2017-04-25 DIAGNOSIS — C8590 Non-Hodgkin lymphoma, unspecified, unspecified site: Secondary | ICD-10-CM | POA: Diagnosis not present

## 2017-04-28 DIAGNOSIS — C8590 Non-Hodgkin lymphoma, unspecified, unspecified site: Secondary | ICD-10-CM | POA: Diagnosis not present

## 2017-05-01 ENCOUNTER — Telehealth: Payer: Self-pay | Admitting: Hematology

## 2017-05-01 NOTE — Telephone Encounter (Signed)
Appt has been scheduled for the pt to see Dr. Irene Limbo on 4/2 at [facility]. Pt aware to arrive 30 minutes early.

## 2017-05-02 ENCOUNTER — Telehealth: Payer: Self-pay | Admitting: Hematology

## 2017-05-02 ENCOUNTER — Encounter: Payer: Self-pay | Admitting: Hematology

## 2017-05-02 ENCOUNTER — Inpatient Hospital Stay: Payer: PPO

## 2017-05-02 ENCOUNTER — Inpatient Hospital Stay: Payer: PPO | Attending: Hematology | Admitting: Hematology

## 2017-05-02 VITALS — BP 142/79 | HR 75 | Temp 98.2°F | Resp 18 | Ht 63.0 in | Wt 188.9 lb

## 2017-05-02 DIAGNOSIS — F418 Other specified anxiety disorders: Secondary | ICD-10-CM | POA: Diagnosis not present

## 2017-05-02 DIAGNOSIS — D696 Thrombocytopenia, unspecified: Secondary | ICD-10-CM | POA: Diagnosis not present

## 2017-05-02 DIAGNOSIS — Z880 Allergy status to penicillin: Secondary | ICD-10-CM | POA: Diagnosis not present

## 2017-05-02 DIAGNOSIS — C8294 Follicular lymphoma, unspecified, lymph nodes of axilla and upper limb: Secondary | ICD-10-CM

## 2017-05-02 DIAGNOSIS — Z79899 Other long term (current) drug therapy: Secondary | ICD-10-CM | POA: Diagnosis not present

## 2017-05-02 DIAGNOSIS — B1910 Unspecified viral hepatitis B without hepatic coma: Secondary | ICD-10-CM

## 2017-05-02 DIAGNOSIS — M8580 Other specified disorders of bone density and structure, unspecified site: Secondary | ICD-10-CM | POA: Diagnosis not present

## 2017-05-02 DIAGNOSIS — M1990 Unspecified osteoarthritis, unspecified site: Secondary | ICD-10-CM | POA: Diagnosis not present

## 2017-05-02 DIAGNOSIS — K219 Gastro-esophageal reflux disease without esophagitis: Secondary | ICD-10-CM | POA: Diagnosis not present

## 2017-05-02 LAB — CBC WITH DIFFERENTIAL (CANCER CENTER ONLY)
BASOS ABS: 0.1 10*3/uL (ref 0.0–0.1)
Basophils Relative: 1 %
EOS PCT: 4 %
Eosinophils Absolute: 0.3 10*3/uL (ref 0.0–0.5)
HEMATOCRIT: 36.2 % (ref 34.8–46.6)
Hemoglobin: 11.7 g/dL (ref 11.6–15.9)
LYMPHS ABS: 1.8 10*3/uL (ref 0.9–3.3)
LYMPHS PCT: 26 %
MCH: 29.3 pg (ref 25.1–34.0)
MCHC: 32.3 g/dL (ref 31.5–36.0)
MCV: 90.7 fL (ref 79.5–101.0)
Monocytes Absolute: 1.4 10*3/uL — ABNORMAL HIGH (ref 0.1–0.9)
Monocytes Relative: 21 %
NEUTROS PCT: 48 %
Neutro Abs: 3.3 10*3/uL (ref 1.5–6.5)
Platelet Count: 122 10*3/uL — ABNORMAL LOW (ref 145–400)
RBC: 3.99 MIL/uL (ref 3.70–5.45)
RDW: 14.4 % (ref 11.2–14.5)
WBC Count: 6.8 10*3/uL (ref 3.9–10.3)

## 2017-05-02 LAB — CMP (CANCER CENTER ONLY)
ALT: 18 U/L (ref 0–55)
AST: 29 U/L (ref 5–34)
Albumin: 3.5 g/dL (ref 3.5–5.0)
Alkaline Phosphatase: 87 U/L (ref 40–150)
Anion gap: 8 (ref 3–11)
BUN: 15 mg/dL (ref 7–26)
CHLORIDE: 109 mmol/L (ref 98–109)
CO2: 25 mmol/L (ref 22–29)
CREATININE: 0.99 mg/dL (ref 0.60–1.10)
Calcium: 8.9 mg/dL (ref 8.4–10.4)
GFR, Estimated: 56 mL/min — ABNORMAL LOW (ref >60)
Glucose, Bld: 91 mg/dL (ref 70–140)
Potassium: 4.2 mmol/L (ref 3.5–5.1)
SODIUM: 142 mmol/L (ref 136–145)
Total Bilirubin: 0.7 mg/dL (ref 0.2–1.2)
Total Protein: 6.3 g/dL — ABNORMAL LOW (ref 6.4–8.3)

## 2017-05-02 LAB — RETICULOCYTES
RBC.: 3.99 MIL/uL (ref 3.70–5.45)
Retic Count, Absolute: 71.8 10*3/uL (ref 33.7–90.7)
Retic Ct Pct: 1.8 % (ref 0.7–2.1)

## 2017-05-02 LAB — LACTATE DEHYDROGENASE: LDH: 212 U/L (ref 125–245)

## 2017-05-02 NOTE — Progress Notes (Signed)
HEMATOLOGY/ONCOLOGY CONSULTATION NOTE  Date of Service: 05/02/2017  Patient Care Team: Alroy Dust, L.Marlou Sa, MD as PCP - General (Family Medicine)  CHIEF COMPLAINTS/PURPOSE OF CONSULTATION:  Follicular Lymphoma  HISTORY OF PRESENTING ILLNESS:    Brooke Huber is a wonderful 72 y.o. female who has been referred to [name] by her PCP, Dr. 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 She presents to the clinic today accompanied by her friend.   She had a yearly screening mammogram on 04/17/17 which found her to have an enlarged axillary lymph nodes. She had a biopsy on 04/28/17 which showed evidence of lymphoma.   Today she notes she feels fine in general with no changes over the last 6 months. She hopes to go to the beach on the 18th of this month with her friend for 5 days.   She notes years ago in the late 1990s she went to donate blood and they told her she had Hep B, but never had liver damage/change or concerning symptoms. She never had been out of the country, tattoo or blood transfusion. She denies any other major comorbidities. She sees a psychiatrist every 4 months for her depression and anxiety. She notes diagnosis like this can trigger her depression but she is working to maintain her mood during this time.   On review of symptoms, pt notes limited ROM of her left arm for the last 2 years with tenderness. She notes arthritis in her hands and possibly in her shoulder. She denies bowel issues or trouble with bowel movements or passing urine.     MEDICAL HISTORY:  Past Medical History:  Diagnosis Date  . 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 Depression   . GERD (gastroesophageal reflux disease)   . Hepatitis   . Hepatitis B 02/1995  . Seizures ([place])    hx of seizure in 1993 and 1994 - caused by stress per patient   -Diverticulitis  -Osteopenia  -Acute gastritis   SURGICAL HISTORY: Past Surgical History:  Procedure Laterality Date  .  APPENDECTOMY    . COLONOSCOPY WITH PROPOFOL N/A 12/29/2015   Procedure: COLONOSCOPY WITH PROPOFOL;  Surgeon: Garlan Fair, MD;  Location: [facility];  Service: Endoscopy;  Laterality: N/A;  . TONSILLECTOMY AND ADENOIDECTOMY Bilateral 1954  . TUBAL LIGATION      SOCIAL HISTORY: Social History   Socioeconomic History  . Marital status: Single    Spouse name: Not on file  . Number of children: Not on file  . Years of education: Not on file  . Highest education level: Not on file  Occupational History  . Not on file  Social Needs  . Financial resource strain: Not on file  . Food insecurity:    Worry: Not on file    Inability: Not on file  . Transportation needs:    Medical: Not on file    Non-medical: Not on file  Tobacco Use  . Smoking status: Never Smoker  . Smokeless tobacco: Never Used  Substance and Sexual Activity  . Alcohol use: No  . Drug use: No  . Sexual activity: Never    Birth control/protection: None  Lifestyle  . Physical activity:    Days per week: Not on file    Minutes per session: Not on file  . Stress: Not on file  Relationships  . Social connections:    Talks on phone: Not on file    Gets together: Not on file  Attends religious service: Not on file    Active member of club or organization: Not on file    Attends meetings of clubs or organizations: Not on file    Relationship status: Not on file  . Intimate partner violence:    Fear of current or ex partner: Not on file    Emotionally abused: Not on file    Physically abused: Not on file    Forced sexual activity: Not on file  Other Topics Concern  . Not on file  Social History Narrative  . Not on file    FAMILY HISTORY: Family History  Problem Relation Age of Onset  . Alzheimer's disease Sister   . Dementia Sister   . Sexual abuse Sister   . Seizures Sister   . Alcohol abuse Brother   . Alcohol abuse Maternal Uncle   . Drug abuse Cousin   . Drug abuse Son   . Depression Neg  Hx     ALLERGIES:  is allergic to penicillins.  MEDICATIONS:  Current Outpatient Medications  Medication Sig Dispense Refill  . Calcium Carb-Cholecalciferol 600-100 MG-UNIT CAPS Take 1 tablet by mouth daily.    . risperiDONE (RISPERDAL) 0.5 MG tablet Take 1 tablet (0.5 mg total) by mouth at bedtime. 90 tablet 1  . sertraline (ZOLOFT) 50 MG tablet Take 1 tablet (50 mg total) by mouth daily. 90 tablet 1  . vitamin C (ASCORBIC ACID) 500 MG tablet Take 500 mg by mouth daily.     No current facility-administered medications for this visit.     REVIEW OF SYSTEMS:   10 Point review of Systems was done is negative except as noted above.  PHYSICAL EXAMINATION: ECOG PERFORMANCE STATUS: 1 - Symptomatic but completely ambulatory  . Vitals:   05/02/17 0839  BP: (!) 142/79  Pulse: 75  Resp: 18  Temp: 98.2 F (36.8 C)  SpO2: 95%   Filed Weights   05/02/17 0839  Weight: 188 lb 14.4 oz (85.7 kg)   .Body mass index is 33.46 kg/m.  GENERAL:alert, in no acute distress and comfortable SKIN: no acute rashes, no significant lesions EYES: conjunctiva are pink and non-injected, sclera anicteric OROPHARYNX: MMM, no exudates, no oropharyngeal erythema or ulceration NECK: supple, no JVD LYMPH:  no palpable lymphadenopathy in the inguinal regions (+) small palpable lymph node in left upper neck and middle right neck, very small palpable lymph nodes in right axilla LUNGS: clear to auscultation b/l with normal respiratory effort HEART: regular rate & rhythm ABDOMEN:  normoactive bowel sounds , non tender, not distended. Extremity: no pedal edema PSYCH: alert & oriented x 3 with fluent speech NEURO: no focal motor/sensory deficits  LABORATORY DATA:  I have reviewed the data as listed  . CBC Latest Ref Rng & Units 05/02/2017 10/06/2016 11/10/2015  WBC 3.9 - 10.3 K/uL 6.8 7.4 8.2  Hemoglobin 11.1 - 15.9 g/dL - 13.3 13.4  Hematocrit 34.8 - 46.6 % 36.2 40.9 41.0  Platelets 145 - 400 K/uL 122(L)  146(L) 209    . CMP Latest Ref Rng & Units 05/02/2017 10/06/2016 11/10/2015  Glucose 70 - 140 mg/dL 91 85 84  BUN 7 - 26 mg/dL 15 14 14   Creatinine 0.60 - 1.10 mg/dL 0.99 1.18(H) 1.02(H)  Sodium 136 - 145 mmol/L 142 141 139  Potassium 3.5 - 5.1 mmol/L 4.2 4.4 4.3  Chloride 98 - 109 mmol/L 109 102 104  CO2 22 - 29 mmol/L 25 24 27   Calcium 8.4 - 10.4 mg/dL 8.9 8.9  8.8  Total Protein 6.4 - 8.3 g/dL 6.3(L) 6.5 6.5  Total Bilirubin 0.2 - 1.2 mg/dL 0.7 0.8 0.7  Alkaline Phos 40 - 150 U/L 87 83 73  AST 5 - 34 U/L 29 25 20   ALT 0 - 55 U/L 18 13 12      PATHOLOGY    Diagnosis 04/25/17  Lymph node, needle/core biopsy, left - FOLLICULAR LYMPHOMA, GRADE 1-2. - SEE ONCOLOGY TABLE. Microscopic Comment LYMPHOMA Histologic type: Non-Hodgkin B-cell lymphoma: Follicular lymphoma. Grade (if applicable): Low grade (grade 1-2). Flow cytometry: N/A. Immunohistochemical stains: CD20, CD3, CD5, CD10, CD23, CD43, bcl-2, bcl-6, CD138, light chains, Ki-67. Touch preps/imprints: N/A. Comments: Core biopsies reveal effacement by a vaguely nodular infiltrate of atypical lymphocytes. The lymphocytes are predominately small with irregular nuclear contours. There are no diffuse areas of large cells. Immunohistochemistry reveals abundant CD20-positive B-cells. CD3, CD43, and CD5 highlight interfollicular T-cells. Bcl-6 and CD23 reveal numerous small follicles with bcl-6 positive cells extending outside the follicular dendritic networks. The majority of these cells are bcl-2 positive. CD10 reveals only a few small residual germinal center foci. CD138 highlights scattered plasma cells which are polytypic by light chain in situ hybridization. The overall findings are most consistent with a low grade follicular lymphoma. Dr. Isaiah Blakes was notified on 04/27/2017.   RADIOGRAPHIC STUDIES: I have personally reviewed the radiological images as listed and agreed with the findings in the report. No results  found.  Screening Mammogram 04/17/17 IMPRESSION:  The New enlarged nodes in both axillae are indeterminate. An [name] is recommended.    ASSESSMENT & PLAN:   Brooke Huber is a 72 y.o. Caucasian female with    1. 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  2. Mild thrombocytopenia-- likely from lymphoma. ? BM involvement vs Immune thrombocytopenia PLAN:  -I discussed her diagnosis of low grade follicular Lymphoma, and what this means, a cancer of the lymph nodes. I provided her with reading material about this.  -I discussed pt's pathology results with her and her friend that show evidence of Non-Hodkins B-Cell Follicular Lymphoma, with recommendation of intial workup, staging, natural history and treatment criteria and treatment options. -I discussed our next step is to see how extensive this cancer is with a PET/CT scan.  -Will get baseline labs including LDH levels -Based on her symptoms and overall heath, we will discuss her treatment options. If asymptomatic, observation is recommended with repeat Labs every 3 months and repeat CT scans every 6 months. I discussed symptoms  to look for.  -I discussed there is very low risk of Follicular lymphoma transforming into a high grade lymphoma -I do not have high suspicion that she immediately needs treatment. She is fine to go to her beach trip this month.   3. Chronic Hepatitis B ? -Will do blood test today to verify Hep B as this can effect cancer treatment plan (especially the use of Rituxan).    Labs Today  PET/CT in 5 days ([facility]) RTC with Dr Irene Limbo 05/10/2017 12:30 with labs and PET/CT   All of the patients questions  were answered with apparent satisfaction. The patient knows to call the clinic with any problems, questions or concerns.  I spent 45 minutes counseling the patient face  to face. The total time spent in the appointment was 60 minutes and more than 50% was on counseling and direct patient cares.    Sullivan Lone MD [place] AAHIVMS [facility] Hematology/Oncology Physician [facility]  (Office):       [phone] (Work cell):  [phone] (Fax):           [phone]  05/02/2017 9:30 AM  This document serves as a record of services personally performed by Sullivan Lone, MD. It was created on his behalf by Joslyn Devon, a trained medical scribe. The creation of this record is based on the scribe's personal observations and the provider's statements to them.    .I have reviewed the above documentation for accuracy and completeness, and I agree with the above. Brunetta Genera MD MS

## 2017-05-02 NOTE — Telephone Encounter (Signed)
Appointments scheduled AVS/Calendar printed per 4/2 los °

## 2017-05-03 LAB — HEPATITIS B CORE ANTIBODY, IGM: Hep B C IgM: NEGATIVE

## 2017-05-03 LAB — HEPATITIS B SURFACE ANTIGEN

## 2017-05-03 LAB — HEPATITIS B SURFACE AG, CONFIRM: HBSAG CONFIRMATION: POSITIVE — AB

## 2017-05-03 LAB — HEPATITIS C ANTIBODY

## 2017-05-03 LAB — HEPATITIS B CORE ANTIBODY, TOTAL: Hep B Core Total Ab: POSITIVE — AB

## 2017-05-04 LAB — HEPATITIS B DNA, ULTRAQUANTITATIVE, PCR
HBV DNA SERPL PCR-ACNC: 290 [IU]/mL
HBV DNA SERPL PCR-LOG IU: 2.462 {Log_IU}/mL

## 2017-05-05 ENCOUNTER — Ambulatory Visit (HOSPITAL_COMMUNITY)
Admission: RE | Admit: 2017-05-05 | Discharge: 2017-05-05 | Disposition: A | Discharge status: Home / Self Care | Payer: PPO | Source: Ambulatory Visit | Attending: Hematology | Admitting: Hematology

## 2017-05-05 DIAGNOSIS — R161 Splenomegaly, not elsewhere classified: Secondary | ICD-10-CM | POA: Insufficient documentation

## 2017-05-05 DIAGNOSIS — C8294 Follicular lymphoma, unspecified, lymph nodes of axilla and upper limb: Secondary | ICD-10-CM | POA: Diagnosis not present

## 2017-05-05 DIAGNOSIS — C8290 Follicular lymphoma, unspecified, unspecified site: Secondary | ICD-10-CM | POA: Diagnosis not present

## 2017-05-05 DIAGNOSIS — Z79899 Other long term (current) drug therapy: Secondary | ICD-10-CM | POA: Diagnosis not present

## 2017-05-05 LAB — GLUCOSE, CAPILLARY: Glucose-Capillary: 82 mg/dL (ref 65–99)

## 2017-05-05 MED ORDER — FLUDEOXYGLUCOSE F - 18 (FDG) INJECTION
9.4000 | Freq: Once | INTRAVENOUS | Status: AC | PRN
  Administered 2017-05-05: 9.4 via INTRAVENOUS

## 2017-05-10 ENCOUNTER — Inpatient Hospital Stay (HOSPITAL_BASED_OUTPATIENT_CLINIC_OR_DEPARTMENT_OTHER): Payer: PPO | Admitting: Hematology

## 2017-05-10 VITALS — BP 137/58 | HR 80 | Temp 98.3°F | Resp 17 | Ht 63.0 in | Wt 186.0 lb

## 2017-05-10 DIAGNOSIS — C8298 Follicular lymphoma, unspecified, lymph nodes of multiple sites: Secondary | ICD-10-CM | POA: Diagnosis not present

## 2017-05-10 DIAGNOSIS — B181 Chronic viral hepatitis B without delta-agent: Secondary | ICD-10-CM | POA: Diagnosis not present

## 2017-05-10 DIAGNOSIS — C8294 Follicular lymphoma, unspecified, lymph nodes of axilla and upper limb: Secondary | ICD-10-CM | POA: Diagnosis not present

## 2017-05-10 NOTE — Progress Notes (Signed)
HEMATOLOGY/ONCOLOGY CONSULTATION NOTE  Date of Service: 05/10/17  Patient Care Team: Alroy Dust, L.Marlou Sa, MD as PCP - General (Family Medicine)  CHIEF COMPLAINTS/PURPOSE OF CONSULTATION:  Newly diagnosed Follicular Lymphoma  HISTORY OF PRESENTING ILLNESS:    Brooke Huber is a wonderful 72 y.o. female who has been referred to [name] by her PCP, Dr. 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 She presents to the clinic today accompanied by her friend.   She had a yearly screening mammogram on 04/17/17 which found her to have an enlarged axillary lymph nodes. She had a biopsy on 04/28/17 which showed evidence of lymphoma.   Today she notes she feels fine in general with no changes over the last 6 months. She hopes to go to the beach on the 18th of this month with her friend for 5 days.   She notes years ago in the late 1990s she went to donate blood and they told her she had Hep B, but never had liver damage/change or concerning symptoms. She never had been out of the country, tattoo or blood transfusion. She denies any other major comorbidities. She sees a psychiatrist every 4 months for her depression and anxiety. She notes diagnosis like this can trigger her depression but she is working to maintain her mood during this time.   On review of symptoms, pt notes limited ROM of her left arm for the last 2 years with tenderness. She notes arthritis in her hands and possibly in her shoulder. She denies bowel issues or trouble with bowel movements or passing urine.  Interval History:  Brooke Huber returns today regarding her Follicular Lymphoma. The patient's last visit with [name] was on 05/02/17. She is accompanied today by her friend. The pt reports that she is doing well overall.   The pt reports that she previously saw ID but denies receiving active treatment of her Hepatitis B. She denies being connected to an Infectious Disease physician at this time.   Of  note since the patient's last visit, pt has had PET completed on 05/05/17 with results revealing 1. Multiple hypermetabolic lymph node stations consistent with high-grade lymphoma (activity significant greater than liver) 2. Hypermetabolic nodes include bilateral cervical lymph nodes, axillary lymph nodes, LEFT supraclavicular lymph nodes, periaortic lymph nodes and external iliac lymph nodes. Potential assessable node for biopsy would be LEFT supraclavicular or the large external iliac nodes. 3. Splenomegaly with moderate metabolic activity similar to liver. 4. Intense metabolic activity throughout the axillary skeleton and pelvis. Favor marrow involvement with lymphoma..  Lab results (05/02/17) of CBC, CMP, and Reticulocytes is as follows: all values are WNL except for Platelets at 122k, Monocytes Abs at 1.4k, Total Protein at 6.3. Hep B Core Antibody, total 05/02/17 revealed a Positive result.  Hep B Surface Ag 05/02/17 revealed a Positive result. Hep B DNA, Ultraquantitative, PCR 05/02/17 revealed HBV DNA SERPL PCR-ACNC at 290 and HBV DNA SERPL PCR-LOG IU at 2.462.   LDH 05/02/17 was WNL at 212.  On review of systems, pt denies fevers, chills, night sweats, and any other symptoms.   MEDICAL HISTORY:  Past Medical History:  Diagnosis Date  . 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 Depression   . GERD (gastroesophageal reflux disease)   . Hepatitis   . Hepatitis B 02/1995  . Seizures ([place])    hx of seizure in 1993 and 1994 - caused by stress per patient   -Diverticulitis  -Osteopenia  -Acute gastritis  SURGICAL HISTORY: Past Surgical History:  Procedure Laterality Date  . APPENDECTOMY    . COLONOSCOPY WITH PROPOFOL N/A 12/29/2015   Procedure: COLONOSCOPY WITH PROPOFOL;  Surgeon: Garlan Fair, MD;  Location: [facility];  Service: Endoscopy;  Laterality: N/A;  . TONSILLECTOMY AND ADENOIDECTOMY Bilateral 1954  . TUBAL LIGATION      SOCIAL HISTORY: Social History   Socioeconomic  History  . Marital status: Single    Spouse name: Not on file  . Number of children: Not on file  . Years of education: Not on file  . Highest education level: Not on file  Occupational History  . Not on file  Social Needs  . Financial resource strain: Not on file  . Food insecurity:    Worry: Not on file    Inability: Not on file  . Transportation needs:    Medical: Not on file    Non-medical: Not on file  Tobacco Use  . Smoking status: Never Smoker  . Smokeless tobacco: Never Used  Substance and Sexual Activity  . Alcohol use: No  . Drug use: No  . Sexual activity: Never    Birth control/protection: None  Lifestyle  . Physical activity:    Days per week: Not on file    Minutes per session: Not on file  . Stress: Not on file  Relationships  . Social connections:    Talks on phone: Not on file    Gets together: Not on file    Attends religious service: Not on file    Active member of club or organization: Not on file    Attends meetings of clubs or organizations: Not on file    Relationship status: Not on file  . Intimate partner violence:    Fear of current or ex partner: Not on file    Emotionally abused: Not on file    Physically abused: Not on file    Forced sexual activity: Not on file  Other Topics Concern  . Not on file  Social History Narrative  . Not on file    FAMILY HISTORY: Family History  Problem Relation Age of Onset  . Alzheimer's disease Sister   . Dementia Sister   . Sexual abuse Sister   . Seizures Sister   . Alcohol abuse Brother   . Alcohol abuse Maternal Uncle   . Drug abuse Cousin   . Drug abuse Son   . Depression Neg Hx     ALLERGIES:  is allergic to penicillins.  MEDICATIONS:  Current Outpatient Medications  Medication Sig Dispense Refill  . Calcium Carb-Cholecalciferol 600-100 MG-UNIT CAPS Take 1 tablet by mouth daily.    . risperiDONE (RISPERDAL) 0.5 MG tablet Take 1 tablet (0.5 mg total) by mouth at bedtime. 90 tablet 1    . sertraline (ZOLOFT) 50 MG tablet Take 1 tablet (50 mg total) by mouth daily. 90 tablet 1  . vitamin C (ASCORBIC ACID) 500 MG tablet Take 500 mg by mouth daily.     No current facility-administered medications for this visit.     REVIEW OF SYSTEMS:    10 Point review of Systems was done is negative except as noted above.   PHYSICAL EXAMINATION: ECOG PERFORMANCE STATUS: 1 - Symptomatic but completely ambulatory  . Vitals:   05/10/17 1206  BP: (!) 137/58  Pulse: 80  Resp: 17  Temp: 98.3 F (36.8 C)  SpO2: 96%   Filed Weights   05/10/17 1206  Weight: 186 lb (84.4 kg)   .  Body mass index is 32.95 kg/m.  GENERAL:alert, in no acute distress and comfortable SKIN: no acute rashes, no significant lesions EYES: conjunctiva are pink and non-injected, sclera anicteric OROPHARYNX: MMM, no exudates, no oropharyngeal erythema or ulceration NECK: supple, no JVD LYMPH:  no palpable lymphadenopathy in the inguinal region (+) small palpable lymph node in the upper neck and middle right neck, very small palpable lymph nodes in right axilla LUNGS: clear to auscultation b/l with normal respiratory effort HEART: regular rate & rhythm ABDOMEN:  normoactive bowel sounds , non tender, not distended. Extremity: no pedal edema PSYCH: alert & oriented x 3 with fluent speech NEURO: no focal motor/sensory deficits   LABORATORY DATA:  I have reviewed the data as listed  . CBC Latest Ref Rng & Units 05/02/2017 10/06/2016 11/10/2015  WBC 3.9 - 10.3 K/uL 6.8 7.4 8.2  Hemoglobin 11.6 - 15.9 g/dL 11.7 13.3 13.4  Hematocrit 34.8 - 46.6 % 36.2 40.9 41.0  Platelets 145 - 400 K/uL 122(L) 146(L) 209    . CMP Latest Ref Rng & Units 05/02/2017 10/06/2016 11/10/2015  Glucose 70 - 140 mg/dL 91 85 84  BUN 7 - 26 mg/dL 15 14 14   Creatinine 0.60 - 1.10 mg/dL 0.99 1.18(H) 1.02(H)  Sodium 136 - 145 mmol/L 142 141 139  Potassium 3.5 - 5.1 mmol/L 4.2 4.4 4.3  Chloride 98 - 109 mmol/L 109 102 104  CO2 22 - 29  mmol/L 25 24 27   Calcium 8.4 - 10.4 mg/dL 8.9 8.9 8.8  Total Protein 6.4 - 8.3 g/dL 6.3(L) 6.5 6.5  Total Bilirubin 0.2 - 1.2 mg/dL 0.7 0.8 0.7  Alkaline Phos 40 - 150 U/L 87 83 73  AST 5 - 34 U/L 29 25 20   ALT 0 - 55 U/L 18 13 12    Component     Latest Ref Rng & Units 05/02/2017  HBV DNA SERPL PCR-ACNC     IU/mL 290  HBV DNA SERPL PCR-LOG IU     log10 IU/mL 2.462  Test Info:      Comment  Hep B Core Ab, IgM     Negative Negative  Hep B Core Ab, Tot     Negative Positive (A)  Hepatitis B Surface Ag     Negative Confirm. indicated  HCV Ab     0.0 - 0.9 s/co ratio <0.1  HBsAg Conf      Positive (A)    PATHOLOGY    Diagnosis 04/25/17  Lymph node, needle/core biopsy, left - FOLLICULAR LYMPHOMA, GRADE 1-2. - SEE ONCOLOGY TABLE. Microscopic Comment LYMPHOMA Histologic type: Non-Hodgkin B-cell lymphoma: Follicular lymphoma. Grade (if applicable): Low grade (grade 1-2). Flow cytometry: N/A. Immunohistochemical stains: CD20, CD3, CD5, CD10, CD23, CD43, bcl-2, bcl-6, CD138, light chains, Ki-67. Touch preps/imprints: N/A. Comments: Core biopsies reveal effacement by a vaguely nodular infiltrate of atypical lymphocytes. The lymphocytes are predominately small with irregular nuclear contours. There are no diffuse areas of large cells. Immunohistochemistry reveals abundant CD20-positive B-cells. CD3, CD43, and CD5 highlight interfollicular T-cells. Bcl-6 and CD23 reveal numerous small follicles with bcl-6 positive cells extending outside the follicular dendritic networks. The majority of these cells are bcl-2 positive. CD10 reveals only a few small residual germinal center foci. CD138 highlights scattered plasma cells which are polytypic by light chain in situ hybridization. The overall findings are most consistent with a low grade follicular lymphoma. Dr. Isaiah Blakes was notified on 04/27/2017.   RADIOGRAPHIC STUDIES: I have personally reviewed the radiological images as listed and  agreed with  the findings in the report. Nm Pet Image Initial (pi) Skull Base To Thigh  Result Date: 05/05/2017 CLINICAL DATA:  Initial treatment strategy for follicular lymphoma. EXAM: NUCLEAR MEDICINE PET SKULL BASE TO THIGH TECHNIQUE: 9.4 mCi F-18 FDG was injected intravenously. Full-ring PET imaging was performed from the skull base to thigh after the radiotracer. CT data was obtained and used for attenuation correction and anatomic localization. Fasting blood glucose: 82 mg/dl COMPARISON:  None. FINDINGS: Mediastinal blood pool activity: SUV max 2.3 Liver metabolic activity: SUV max 3.5 NECK: Small hypermetabolic lymph nodes in the level 5 neck bilaterally and small hypermetabolic level 2 and 3 lymph nodes also. Example lymph node on the RIGHT posterior to the edge of the sternocleidomastoid muscle with SUV max equals 6.6 (fused image 34) Incidental CT findings: none CHEST: Hypermetabolic supraclavicular node on the LEFT measures 2.0 cm (image 52/4) with SUV max equal 8.7. Bilateral hypermetabolic axillary lymph nodes. Example lymph node RIGHT axilla with SUV max equal 10.1. Minimal hypermetabolic mediastinal lymph nodes. Incidental CT findings: Small RIGHT effusion. No suspicious pulmonary nodules. ABDOMEN/PELVIS: Spleen is enlarged but with normal metabolic activity. Cluster of hypermetabolic nodules in the splenic hilum adjacent to the pancreatic tail with SUV max equal 7.5. Hypermetabolic peripancreatic and periportal and periaortic lymph nodes in the upper abdomen. These nodes are small measuring 10 mm short axis and with metabolic activity upwards of SUV max equal 6 in the porta hepatis. There are intensely hypermetabolic LEFT and RIGHT external iliac lymph nodes adjacent the operator space is SUV max equal 12 on the LEFT. These lymph nodes measure 2.0 by 5.0 cm bilateral hypermetabolic inguinal nodes additionally. Incidental CT findings: Uterus normal. No bowel abnormality no liver abnormality SKELETON:  There is increase in marrow activity within the proximal long bones as well as the pelvis. Example activity in the RIGHT femoral head with SUV max equal 6.9. No lesion on the CT portion exam. Hypermetabolic activity throughout the sacrum with SUV max equal 6.6. Incidental CT findings: none IMPRESSION: 1. Multiple hypermetabolic lymph node stations consistent with high-grade lymphoma (activity significant greater than liver) 2. Hypermetabolic nodes include bilateral cervical lymph nodes, axillary lymph nodes, LEFT supraclavicular lymph nodes, periaortic lymph nodes and external iliac lymph nodes. Potential assessable node for biopsy would be LEFT supraclavicular or the large external iliac nodes. 3. Splenomegaly with moderate metabolic activity similar to liver. 4. Intense metabolic activity throughout the axillary skeleton and and pelvis. Favor marrow involvement with lymphoma. Electronically Signed   By: Suzy Bouchard M.D.   On: 05/05/2017 17:30    Screening Mammogram 04/17/17 IMPRESSION:  The New enlarged nodes in both axillae are indeterminate. An [name] is recommended.    ASSESSMENT & PLAN:   Brooke Huber is a 72 y.o. Caucasian female with    1. 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  2. Mild thrombocytopenia-- likely from lymphoma. ? BM involvement vs Immune thrombocytopenia PLAN: -  Reviewed the most recent 05/05/17 PET scan which revealed multiple hypermetabolic lymph nodes in the chest and abdomen; intense metabolic activity throughout the axillary skeleton and pelvis.  -Likely Stage III or IV Lymphoma indicated by PET scan  -she had several questions and I rediscussed her  diagnosis of low grade  follicular Lymphoma, and what this means, a cancer of the lymph nodes and rediscussed staging, natural history and treatment criteria and treatment options. -Based on her symptoms and overall heath, we discussed her treatment options. She is relatively asymptomatic at this time, observation is recommended with repeat Labs every 3 months and repeat CT scans every 6 months. I discussed symptoms  to look for.  -I discussed there is very low risk of Follicular lymphoma transforming into a high grade lymphoma -I do not have high suspicion that she immediately needs treatment. She is fine to go to her beach trip this month. -we discussed that her chronic active Hep B would be a significant consideration with regards to be able to use Rituxan in the treatment of her lymphhoma given risk of Hep B reactivation and liver failure.   3. Chronic Hepatitis B -Discussed pt labwork today; Hepatitis B labs revealed chronic active disease which will be a limiting factor in her treatment.  -Discussed that Rituxan used to treat lymphoma can worsen the activity of Hep B. -We will need to consult with an Infectious Disease physician for control of her Hep B through our treatment.   -We will place an urgent referral to ID, as addressing her Hep B is the current priority.  -We will see pt back after she is able to meet with ID.   -Urgent referral to infectious disease for Mx of Hep B in a patient needing treatment for Lymphoma -RTC with Dr Irene Limbo in 4 weeks with labs and infectious disease input    All of the patients questions were answered with apparent satisfaction. The patient knows to call the clinic with any problems, questions or concerns.  . The total time spent in the appointment was 25 minutes and more than 50% was on counseling and direct patient cares.      Sullivan Lone MD MS AAHIVMS [facility] Hematology/Oncology Physician [facility]  (Office):        [phone] (Work cell):  [phone] (Fax):           [phone]  05/10/2017 1:22 PM  This document serves as a record of services personally performed by Sullivan Lone, MD. It was created on his behalf by Baldwin Jamaica, a trained medical scribe. The creation of this record is based on the scribe's personal observations and the provider's statements to them.   .I have reviewed the above documentation for accuracy and completeness, and I agree with the above. Brunetta Genera MD MS

## 2017-05-19 DIAGNOSIS — C8590 Non-Hodgkin lymphoma, unspecified, unspecified site: Secondary | ICD-10-CM | POA: Diagnosis not present

## 2017-05-19 DIAGNOSIS — S42331A Displaced oblique fracture of shaft of humerus, right arm, initial encounter for closed fracture: Secondary | ICD-10-CM | POA: Diagnosis not present

## 2017-05-23 ENCOUNTER — Ambulatory Visit (INDEPENDENT_AMBULATORY_CARE_PROVIDER_SITE_OTHER): Payer: PPO | Admitting: Internal Medicine

## 2017-05-23 ENCOUNTER — Encounter: Payer: Self-pay | Admitting: Internal Medicine

## 2017-05-23 DIAGNOSIS — B181 Chronic viral hepatitis B without delta-agent: Secondary | ICD-10-CM | POA: Diagnosis not present

## 2017-05-23 MED ORDER — TENOFOVIR DISOPROXIL FUMARATE 300 MG PO TABS: 300.0000 mg | ORAL_TABLET | Freq: Every day | ORAL | 6 refills | Status: DC

## 2017-05-23 MED ORDER — TENOFOVIR ALAFENAMIDE FUMARATE 25 MG PO TABS: 25.0000 mg | ORAL_TABLET | Freq: Every day | ORAL | 5 refills | Status: DC

## 2017-05-23 NOTE — Progress Notes (Signed)
[place] for Infectious Disease      Reason for Consult: chronic hepatitis B, inactive carrier    Referring Physician: Dr. Irene Limbo    Patient ID: Brooke Huber, female    DOB: 1945/09/20, 72 y.o.   MRN: [id number]  HPI:   She comes in for evaluation of above.  She has a known history of hepatitis B and has been followed at [facility].  She had been seen in the clinic in [facility] but that is no longer available and she does not feel she is able to get to [facility] for continued care. She has never required treatment and has been gettting [place] screening with ultrasounds.  She has genotype A, E antibody positive, E antigen negative disease.  She has had recent labs done with a DNA level of 290 IU, core Ab total positive, AST 29, ALT 18, platelets 122.  Ultrasound last done in July 2018 with no masses noted.  Last AFP was 2.53. She did have a DNA level of 3,894 in 2015.  She was referred by Dr. Irene Limbo due to her recent diagnosis of follicular lymphoma and potential need for treatment with Rituxan or other agent.  At this time it has been considered low grade and no immediate need for treatment though a recent PET scan noted high-grade lymphoma.   Unfortunately she recently broke her right arm from a fall at the beach.  Previous record reviewed Epic in [place] and she has been under the care of Dr. Patsy [place], last saw [facility].    Past Medical History:  Diagnosis Date  . 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 Depression   . GERD (gastroesophageal reflux disease)   . Hepatitis   . Hepatitis B 02/1995  . Seizures ([place])    hx of seizure in 1993 and 1994 - caused by stress per patient     Prior to Admission medications   Medication Sig Start Date End Date Taking? Authorizing Provider  Calcium Carb-Cholecalciferol 600-100 MG-UNIT CAPS Take 1 tablet by mouth daily.    [provider]  risperiDONE (RISPERDAL) 0.5 MG tablet Take 1 tablet (0.5 mg total) by mouth at bedtime.  02/09/17 02/09/18  Charlcie Cradle, MD  sertraline (ZOLOFT) 50 MG tablet Take 1 tablet (50 mg total) by mouth daily. 02/09/17 02/09/18  Charlcie Cradle, MD  vitamin C (ASCORBIC ACID) 500 MG tablet Take 500 mg by mouth daily.    [provider]    Allergies  Allergen Reactions  . Penicillins Hives and Rash    Social History   Tobacco Use  . Smoking status: Never Smoker  . Smokeless tobacco: Never Used  Substance Use Topics  . Alcohol use: No  . Drug use: No    Family History  Problem Relation Age of Onset  . Alzheimer's disease Sister   . Dementia Sister   . Sexual abuse Sister   . Seizures Sister   . Alcohol abuse Brother   . Alcohol abuse Maternal Uncle   . Drug abuse Cousin   . Drug abuse Son   . Depression Neg Hx     Review of Systems  Constitutional: negative for fatigue and malaise Gastrointestinal: negative for diarrhea Integument/breast: negative for rash All other systems reviewed and are negative    Constitutional: in no apparent distress  EYES: anicteric ENMT: no thrush Cardiovascular: Cor RRR Respiratory: CTA B; normal respiratory effort GI: Bowel sounds are normal, liver is not enlarged, spleen  is not enlarged Musculoskeletal: no pedal edema noted Skin: negatives: no rash Hematologic: no cervical lad  Labs: Lab Results  Component Value Date   WBC 6.8 05/02/2017   HGB 11.7 05/02/2017   HCT 36.2 05/02/2017   MCV 90.7 05/02/2017   PLT 122 (L) 05/02/2017    Lab Results  Component Value Date   CREATININE 0.99 05/02/2017   BUN 15 05/02/2017   NA 142 05/02/2017   K 4.2 05/02/2017   CL 109 05/02/2017   CO2 25 05/02/2017    Lab Results  Component Value Date   ALT 18 05/02/2017   AST 29 05/02/2017   ALKPHOS 87 05/02/2017   BILITOT 0.7 05/02/2017   INR 1.0 08/31/2006     Assessment: chronic hepatitis B.  She has not required treatment in the past due to low level viremia and normal AST and ALT with no liver disease.  However, as she  does have some positive DNA, she does run the risk of reactivation in the setting of immunosuppressive agents such as Rituxan and with her higher level lymphoma, I do believe she will be starting chemotherapy.  Therefore I will start her on treatment during her lymphoma treatment and continue for 6-12 months after treatment completion.   Also, her last [place] screening was in July 2018 and will schedule her for liver ultrasound, unless the PET scan is felt adequate screening by radiology.    Plan: 1) start tenofovir AF when approved; will use TDF if TAF not approved but with her age and impending chemo, I strongly prefer TAF 2) will defer labs since they have been done already; will do at her next visit in 6 weeks. 3) [place] screening with ultrasound; will do AFP with next labs.

## 2017-05-25 ENCOUNTER — Ambulatory Visit (INDEPENDENT_AMBULATORY_CARE_PROVIDER_SITE_OTHER): Payer: Self-pay | Admitting: Physician Assistant

## 2017-05-26 ENCOUNTER — Ambulatory Visit (INDEPENDENT_AMBULATORY_CARE_PROVIDER_SITE_OTHER): Payer: PPO | Admitting: Physician Assistant

## 2017-05-26 ENCOUNTER — Encounter (INDEPENDENT_AMBULATORY_CARE_PROVIDER_SITE_OTHER): Payer: Self-pay | Admitting: Physician Assistant

## 2017-05-26 ENCOUNTER — Ambulatory Visit (INDEPENDENT_AMBULATORY_CARE_PROVIDER_SITE_OTHER): Payer: Self-pay | Admitting: Physician Assistant

## 2017-05-26 ENCOUNTER — Ambulatory Visit (INDEPENDENT_AMBULATORY_CARE_PROVIDER_SITE_OTHER): Payer: Self-pay

## 2017-05-26 ENCOUNTER — Ambulatory Visit (INDEPENDENT_AMBULATORY_CARE_PROVIDER_SITE_OTHER): Payer: PPO

## 2017-05-26 VITALS — Ht 63.0 in | Wt 188.0 lb

## 2017-05-26 DIAGNOSIS — S42331A Displaced oblique fracture of shaft of humerus, right arm, initial encounter for closed fracture: Secondary | ICD-10-CM | POA: Insufficient documentation

## 2017-05-26 DIAGNOSIS — S42301A Unspecified fracture of shaft of humerus, right arm, initial encounter for closed fracture: Secondary | ICD-10-CM | POA: Diagnosis not present

## 2017-05-26 NOTE — Progress Notes (Signed)
Patient comes in for x-rays in the Sarmiento brace.  X-rays demonstrate acceptable alignment of the midshaft humerus fracture.  I discussed with patient there is a 5% chance that this would be a nonunion.  She would like to continue to treat this conservatively.  We have provided her with an Ace wrap from her hand to the Sarmiento brace to help with swelling.  She will elevate as much as possible.  She will work on hand and wrist range of motion in the meantime.  Follow-up with [name] in 1 week's time for repeat evaluation x-ray.  Call if concerns or questions in the meantime.

## 2017-05-26 NOTE — Progress Notes (Signed)
Office Visit Note   Patient: Brooke Huber           Date of Birth: 11/20/1945           MRN: [id number] Visit Date: 05/26/2017              Requested by: Alroy Dust, L.Marlou Sa, [address] [employer] [address] [place], [place] [address] PCP: Alroy Dust, L.Marlou Sa, MD   Assessment & Plan: Visit Diagnoses:  1. Closed displaced oblique fracture of shaft of right humerus, initial encounter     Plan: At this point, we will send patient over to [facility] to be fitted for a Sarmiento brace.  She will follow back up with [name] this afternoon for repeat x-ray to determine if this can be treated nonoperatively.  Follow-Up Instructions: Return today (on 05/26/2017) for 1 pm.   Orders:  Orders Placed This Encounter  Procedures  . XR Humerus Right   No orders of the defined types were placed in this encounter.     Procedures: No procedures performed   Clinical Data: No additional findings.   Subjective: Chief Complaint  Patient presents with  . Right Shoulder - Fracture, Pain    HPI patient is a pleasant 72 year old female who presents to our clinic today with an injury to her right arm.  On 05/19/2017, she was at the [facility] going down a set of stairs when she fell landing on her right side.  She was seen in the ED where x-rays were obtained.  These showed a displaced midshaft fracture to the right humerus.  She was placed in a posterior long-arm splint and sling.  She comes in today for evaluation and further treatment recommendations.  She is right-hand dominant and has a history of lymphoma and hepatitis B.  She is not overtly active.  Review of Systems as detailed in HPI.  All others reviewed and are negative.   Objective: Vital Signs: There were no vitals taken for this visit.  Physical Exam well-developed and well-nourished female in no acute distress.  Alert and oriented x3.  Ortho Exam examination of her right upper extremity reveals marked swelling to the hand.  She is  currently wearing the long-arm splint.  Specialty Comments:  No specialty comments available.  Imaging: Xr Humerus Right  Result Date: 05/26/2017 X-rays of the right humerus reveal an angulated midshaft oblique humerus fracture in approximately 15 degrees of varus    PMFS History: Patient Active Problem List   Diagnosis Date Noted  . Closed displaced oblique fracture of shaft of right humerus 05/26/2017  . Chronic viral hepatitis B without delta-agent ([place]) 05/23/2017  . MDD (major depressive disorder), recurrent, severe, with psychosis ([place]) 06/09/2014  . PTSD (post-traumatic stress disorder) 06/09/2014  . Panic disorder 06/09/2014  . Bereavement 06/09/2014  . Hypokalemia 06/09/2014   Past Medical History:  Diagnosis Date  . 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 Depression   . GERD (gastroesophageal reflux disease)   . Hepatitis   . Hepatitis B 02/1995  . Seizures ([place])    hx of seizure in 1993 and 1994 - caused by stress per patient     Family History  Problem Relation Age of Onset  . Alzheimer's disease Sister   . Dementia Sister   . Sexual abuse Sister   . Seizures Sister   . Alcohol abuse Brother   . Alcohol abuse Maternal Uncle   . Drug abuse Cousin   . Drug abuse Son   .  Depression Neg Hx     Past Surgical History:  Procedure Laterality Date  . APPENDECTOMY    . COLONOSCOPY WITH PROPOFOL N/A 12/29/2015   Procedure: COLONOSCOPY WITH PROPOFOL;  Surgeon: Garlan Fair, MD;  Location: [facility];  Service: Endoscopy;  Laterality: N/A;  . TONSILLECTOMY AND ADENOIDECTOMY Bilateral 1954  . TUBAL LIGATION     Social History   Occupational History  . Not on file  Tobacco Use  . Smoking status: Never Smoker  . Smokeless tobacco: Never Used  Substance and Sexual Activity  . Alcohol use: No  . Drug use: No  . Sexual activity: Never    Birth control/protection: None

## 2017-06-01 ENCOUNTER — Ambulatory Visit (INDEPENDENT_AMBULATORY_CARE_PROVIDER_SITE_OTHER): Payer: PPO

## 2017-06-01 ENCOUNTER — Ambulatory Visit (INDEPENDENT_AMBULATORY_CARE_PROVIDER_SITE_OTHER): Payer: PPO | Admitting: Orthopaedic Surgery

## 2017-06-01 ENCOUNTER — Encounter (INDEPENDENT_AMBULATORY_CARE_PROVIDER_SITE_OTHER): Payer: Self-pay | Admitting: Orthopaedic Surgery

## 2017-06-01 DIAGNOSIS — S42331A Displaced oblique fracture of shaft of humerus, right arm, initial encounter for closed fracture: Secondary | ICD-10-CM

## 2017-06-01 NOTE — Progress Notes (Signed)
      Patient: Brooke Huber           Date of Birth: 26-Jun-1945           MRN: [id number] Visit Date: 06/01/2017 PCP: Alroy Dust, L.Marlou Sa, MD   Assessment & Plan:  Chief Complaint:  Chief Complaint  Patient presents with  . Right Shoulder - Pain   Visit Diagnoses:  1. Closed displaced oblique fracture of shaft of right humerus, initial encounter     Plan: Patient comes in today for follow-up of her displaced humeral shaft fracture on the right.  Date of injury 05/19/2017.  She has been in a Sarmiento brace for the past 6 days.  She has been wearing a sling for comfort.  She notes moderate relief of pain over the past few days.  Of note, she is a smoker.  Examination of the right upper extremity:  She is in the Sarmiento brace.  She does have moderate swelling to the right hand.  Her radial nerve is intact.  She may begin pendulum and gentle elbow range of motion as tolerated.  Follow-up in 4 weeks with 2 view x-rays of the right humerus in the Sarmiento.  Follow-Up Instructions: Return in about 1 month (around 06/29/2017).   Orders:  Orders Placed This Encounter  Procedures  . XR Humerus Right   No orders of the defined types were placed in this encounter.   Imaging: Xr Humerus Right  Result Date: 06/01/2017 Stable and acceptable alignment of humeral shaft fracture   PMFS History: Patient Active Problem List   Diagnosis Date Noted  . Closed displaced oblique fracture of shaft of right humerus 05/26/2017  . Chronic viral hepatitis B without delta-agent ([place]) 05/23/2017  . MDD (major depressive disorder), recurrent, severe, with psychosis ([place]) 06/09/2014  . PTSD (post-traumatic stress disorder) 06/09/2014  . Panic disorder 06/09/2014  . Bereavement 06/09/2014  . Hypokalemia 06/09/2014   Past Medical History:  Diagnosis Date  . 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 Depression   . GERD (gastroesophageal reflux disease)   . Hepatitis   . Hepatitis B 02/1995    . Seizures ([place])    hx of seizure in 1993 and 1994 - caused by stress per patient     Family History  Problem Relation Age of Onset  . Alzheimer's disease Sister   . Dementia Sister   . Sexual abuse Sister   . Seizures Sister   . Alcohol abuse Brother   . Alcohol abuse Maternal Uncle   . Drug abuse Cousin   . Drug abuse Son   . Depression Neg Hx     Past Surgical History:  Procedure Laterality Date  . APPENDECTOMY    . COLONOSCOPY WITH PROPOFOL N/A 12/29/2015   Procedure: COLONOSCOPY WITH PROPOFOL;  Surgeon: Garlan Fair, MD;  Location: [facility];  Service: Endoscopy;  Laterality: N/A;  . TONSILLECTOMY AND ADENOIDECTOMY Bilateral 1954  . TUBAL LIGATION     Social History   Occupational History  . Not on file  Tobacco Use  . Smoking status: Never Smoker  . Smokeless tobacco: Never Used  Substance and Sexual Activity  . Alcohol use: No  . Drug use: No  . Sexual activity: Never    Birth control/protection: None

## 2017-06-06 ENCOUNTER — Telehealth: Payer: Self-pay | Admitting: *Deleted

## 2017-06-06 NOTE — Telephone Encounter (Signed)
Patient needs copay assistance for [facility]. RN activated card, called pharmacy to relay ID information. ID [id number], Kara Dies [id number], PCN [employer], GROUP [id number] Landis Gandy, RN

## 2017-06-07 NOTE — Progress Notes (Signed)
HEMATOLOGY/ONCOLOGY CLINIC NOTE  Date of Service: 06/08/17  Patient Care Team: Alroy Dust, L.Marlou Sa, MD as PCP - General (Family Medicine)  CHIEF COMPLAINTS/PURPOSE OF CONSULTATION:  F/u for mx of recently diagnosed Follicular Lymphoma  HISTORY OF PRESENTING ILLNESS:    Brooke Huber is a wonderful 72 y.o. female who has been referred to [name] by her PCP, Dr. 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 She presents to the clinic today accompanied by her friend.   She had a yearly screening mammogram on 04/17/17 which found her to have an enlarged axillary lymph nodes. She had a biopsy on 04/28/17 which showed evidence of lymphoma.   Today she notes she feels fine in general with no changes over the last 6 months. She hopes to go to the beach on the 18th of this month with her friend for 5 days.   She notes years ago in the late 1990s she went to donate blood and they told her she had Hep B, but never had liver damage/change or concerning symptoms. She never had been out of the country, tattoo or blood transfusion. She denies any other major comorbidities. She sees a psychiatrist every 4 months for her depression and anxiety. She notes diagnosis like this can trigger her depression but she is working to maintain her mood during this time.   On review of symptoms, pt notes limited ROM of her left arm for the last 2 years with tenderness. She notes arthritis in her hands and possibly in her shoulder. She denies bowel issues or trouble with bowel movements or passing urine.  Interval History:  Brooke Huber returns today regarding her low grade Follicular Lymphoma. The patient's last visit with [name] was on 05/10/17. She is accompanied today by her friend.   The pt reports that she is doing well overall but recently brokern her right arm and has recently been fitted for a brace and has seen Dr. Erlinda Hong in orthopedics. The pt saw Dr Linus Salmons with ID on 05/23/17 who has  recommended Tenofovir Alafenamide Fumarate, which she started on 05/24/17.   Lab results today (06/08/17) of CBC, CMP, and Reticulocytes is as follows: all values are WNL except for Hgb at 11.0, HCT at 34.6, RDW at 14.6, Platelets at 128k, Total Protein at 6.2. LDH 06/08/17 is WNL at 171.  On review of systems, pt reports some leg swelling, right arm fx, and denies changes in bowel habits, abdominal pains, and any other symptoms.    MEDICAL HISTORY:  Past Medical History:  Diagnosis Date  . 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 Cancer [facility])    breast lymphoma with mets  . Depression   . GERD (gastroesophageal reflux disease)   . Hepatitis   . Hepatitis B 02/1995  . Seizures ([place])    hx of seizure in 1993 and 1994 - caused by stress per patient   -Diverticulitis  -Osteopenia  -Acute gastritis   SURGICAL HISTORY: Past Surgical History:  Procedure Laterality Date  . APPENDECTOMY    . COLONOSCOPY WITH PROPOFOL N/A 12/29/2015   Procedure: COLONOSCOPY WITH PROPOFOL;  Surgeon: Garlan Fair, MD;  Location: [facility];  Service: Endoscopy;  Laterality: N/A;  . TONSILLECTOMY AND ADENOIDECTOMY Bilateral 1954  . TUBAL LIGATION      SOCIAL HISTORY: Social History   Socioeconomic History  . Marital status: Single    Spouse name: Not on file  . Number of children: Not on file  .  Years of education: Not on file  . Highest education level: Not on file  Occupational History  . Not on file  Social Needs  . Financial resource strain: Not on file  . Food insecurity:    Worry: Not on file    Inability: Not on file  . Transportation needs:    Medical: Not on file    Non-medical: Not on file  Tobacco Use  . Smoking status: Never Smoker  . Smokeless tobacco: Never Used  Substance and Sexual Activity  . Alcohol use: No  . Drug use: No  . Sexual activity: Never    Birth control/protection: None  Lifestyle  . Physical activity:    Days per week: Not on file    Minutes per  session: Not on file  . Stress: Not on file  Relationships  . Social connections:    Talks on phone: Not on file    Gets together: Not on file    Attends religious service: Not on file    Active member of club or organization: Not on file    Attends meetings of clubs or organizations: Not on file    Relationship status: Not on file  . Intimate partner violence:    Fear of current or ex partner: Not on file    Emotionally abused: Not on file    Physically abused: Not on file    Forced sexual activity: Not on file  Other Topics Concern  . Not on file  Social History Narrative  . Not on file    FAMILY HISTORY: Family History  Problem Relation Age of Onset  . Alzheimer's disease Sister   . Dementia Sister   . Sexual abuse Sister   . Seizures Sister   . Alcohol abuse Brother   . Alcohol abuse Maternal Uncle   . Drug abuse Cousin   . Drug abuse Son   . Depression Neg Hx     ALLERGIES:  is allergic to penicillins.  MEDICATIONS:  Current Outpatient Medications  Medication Sig Dispense Refill  . Calcium Carb-Cholecalciferol 600-100 MG-UNIT CAPS Take 1 tablet by mouth daily.    Marland Kitchen HYDROcodone-acetaminophen (NORCO/VICODIN) 5-325 MG tablet TK 1 T PO  Q 4 H PRN P  0  . risperiDONE (RISPERDAL) 0.5 MG tablet Take 1 tablet (0.5 mg total) by mouth at bedtime. 90 tablet 0  . sertraline (ZOLOFT) 50 MG tablet Take 1 tablet (50 mg total) by mouth daily. 90 tablet 0  . tenofovir (VIREAD) 300 MG tablet Take 1 tablet (300 mg total) by mouth daily. 30 tablet 6  . Tenofovir Alafenamide Fumarate 25 MG TABS Take 1 tablet (25 mg total) by mouth daily. 30 tablet 5  . vitamin C (ASCORBIC ACID) 500 MG tablet Take 500 mg by mouth daily.     No current facility-administered medications for this visit.     REVIEW OF SYSTEMS:   A 10+ POINT REVIEW OF SYSTEMS WAS OBTAINED including neurology, dermatology, psychiatry, cardiac, respiratory, lymph, extremities, GI, GU, Musculoskeletal, constitutional,  breasts, reproductive, HEENT.  All pertinent positives are noted in the HPI.  All others are negative.   PHYSICAL EXAMINATION: ECOG PERFORMANCE STATUS: 1 - Symptomatic but completely ambulatory  . Vitals:   06/08/17 1546  BP: 130/61  Pulse: 75  Resp: 17  Temp: 98.7 F (37.1 C)  SpO2: 95%   Filed Weights   06/08/17 1546  Weight: 184 lb 9.6 oz (83.7 kg)   .Body mass index is 32.7 kg/m.  Marland Kitchen  GENERAL:alert, in no acute distress and comfortable SKIN: no acute rashes, no significant lesions EYES: conjunctiva are pink and non-injected, sclera anicteric OROPHARYNX: MMM, no exudates, no oropharyngeal erythema or ulceration NECK: supple, no JVD LYMPH:  no palpable lymphadenopathy in inguinal regions,  (+) small palpable lymph node in the upper neck and middle right neck, very small palpable lymph nodes in the right axilla LUNGS: clear to auscultation b/l with normal respiratory effort HEART: regular rate & rhythm ABDOMEN:  normoactive bowel sounds , non tender, not distended. Extremity: no pedal edema PSYCH: alert & oriented x 3 with fluent speech NEURO: no focal motor/sensory deficits     LABORATORY DATA:  I have reviewed the data as listed  . CBC Latest Ref Rng & Units 06/08/2017 05/02/2017 10/06/2016  WBC 3.9 - 10.3 K/uL 5.2 6.8 7.4  Hemoglobin 11.6 - 15.9 g/dL 11.0(L) 11.7 13.3  Hematocrit 34.8 - 46.6 % 34.6(L) 36.2 40.9  Platelets 145 - 400 K/uL 128(L) 122(L) 146(L)    . CMP Latest Ref Rng & Units 06/08/2017 05/02/2017 10/06/2016  Glucose 70 - 140 mg/dL 96 91 85  BUN 7 - 26 mg/dL 16 15 14   Creatinine 0.60 - 1.10 mg/dL 1.05 0.99 1.18(H)  Sodium 136 - 145 mmol/L 140 142 141  Potassium 3.5 - 5.1 mmol/L 4.1 4.2 4.4  Chloride 98 - 109 mmol/L 107 109 102  CO2 22 - 29 mmol/L 27 25 24   Calcium 8.4 - 10.4 mg/dL 9.0 8.9 8.9  Total Protein 6.4 - 8.3 g/dL 6.2(L) 6.3(L) 6.5  Total Bilirubin 0.2 - 1.2 mg/dL 0.7 0.7 0.8  Alkaline Phos 40 - 150 U/L 122 87 83  AST 5 - 34 U/L 24 29 25   ALT  0 - 55 U/L 14 18 13    Component     Latest Ref Rng & Units 05/02/2017  HBV DNA SERPL PCR-ACNC     IU/mL 290  HBV DNA SERPL PCR-LOG IU     log10 IU/mL 2.462  Test Info:      Comment  Hep B Core Ab, IgM     Negative Negative  Hep B Core Ab, Tot     Negative Positive (A)  Hepatitis B Surface Ag     Negative Confirm. indicated  HCV Ab     0.0 - 0.9 s/co ratio <0.1  HBsAg Conf      Positive (A)    PATHOLOGY    Diagnosis 04/25/17  Lymph node, needle/core biopsy, left - FOLLICULAR LYMPHOMA, GRADE 1-2. - SEE ONCOLOGY TABLE. Microscopic Comment LYMPHOMA Histologic type: Non-Hodgkin B-cell lymphoma: Follicular lymphoma. Grade (if applicable): Low grade (grade 1-2). Flow cytometry: N/A. Immunohistochemical stains: CD20, CD3, CD5, CD10, CD23, CD43, bcl-2, bcl-6, CD138, light chains, Ki-67. Touch preps/imprints: N/A. Comments: Core biopsies reveal effacement by a vaguely nodular infiltrate of atypical lymphocytes. The lymphocytes are predominately small with irregular nuclear contours. There are no diffuse areas of large cells. Immunohistochemistry reveals abundant CD20-positive B-cells. CD3, CD43, and CD5 highlight interfollicular T-cells. Bcl-6 and CD23 reveal numerous small follicles with bcl-6 positive cells extending outside the follicular dendritic networks. The majority of these cells are bcl-2 positive. CD10 reveals only a few small residual germinal center foci. CD138 highlights scattered plasma cells which are polytypic by light chain in situ hybridization. The overall findings are most consistent with a low grade follicular lymphoma. Dr. Isaiah Blakes was notified on 04/27/2017.   RADIOGRAPHIC STUDIES: I have personally reviewed the radiological images as listed and agreed with the findings in the report.  Xr Humerus Right  Result Date: 06/01/2017 Stable and acceptable alignment of humeral shaft fracture  Xr Humerus Right  Result Date: 05/26/2017 X-rays of the right humerus  reveal an angulated midshaft oblique humerus fracture in approximately 15 degrees of varus   Screening Mammogram 04/17/17 IMPRESSION:  The New enlarged nodes in both axillae are indeterminate. An [name] is recommended.    ASSESSMENT & PLAN:   Brooke Huber is a 71 y.o. Caucasian female with    1. 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  2. Mild thrombocytopenia-- likely from lymphoma. ? BM involvement vs Immune thrombocytopenia. PLT 128k   3. Chronic active Hepatitis B -Discussed pt labwork today; Hepatitis B labs revealed chronic active disease which will be a limiting factor in her treatment.  -Pt has been started on Tenofovir AF with ID, Dr Linus Salmons on 05/24/17 PLAN -Discussed pt labwork today, 06/08/17; blood counts and chemistries are stable.  -Discussed that my preference is for hep B to be stable prior to treatment, and that the pt  Be on atleast 4 weeks of TAF prior to initiating FL treatment with BR -she had several questions and I rediscussed her diagnosis of low grade follicular Lymphoma, and what this means, a cancer of the lymph nodes and rediscussed staging, natural history and treatment criteria and treatment options.   -Would also like to allow right arm fx time to heal more before beginning chemo.  -Will repeat labs in 3 weeks -Will set pt up for chemotherapy counseling -Will see pt back in 4 weeks and will plan to begin BR treatment then.    Labs in 3 weeks Starting Bendamustine/Rituxan in 4 weeks Chemo-counseling for BR in 3 weeks RTC with Dr Irene Limbo in 4 weeks with C1D1 of BR    All of the patients questions were answered with apparent  satisfaction. The patient knows to call the clinic with any problems, questions or concerns.  The toal time spent in the appt was 30 minutes and more than 50% was on counseling and direct patient cares.     Sullivan Lone MD [place] AAHIVMS [facility] Hematology/Oncology Physician [facility]  (Office):       [phone] (Work cell):  [phone] (Fax):           [phone]  06/08/2017 4:49 PM  This document serves as a record of services personally performed by Sullivan Lone, MD. It was created on his behalf by Baldwin Jamaica, a trained medical scribe. The creation of this record is based on the scribe's personal observations and the provider's statements to them.   .I have reviewed the above documentation for accuracy and completeness, and I agree with the above. Brunetta Genera MD MS

## 2017-06-08 ENCOUNTER — Telehealth: Payer: Self-pay | Admitting: Hematology

## 2017-06-08 ENCOUNTER — Inpatient Hospital Stay (HOSPITAL_BASED_OUTPATIENT_CLINIC_OR_DEPARTMENT_OTHER): Payer: PPO | Admitting: Hematology

## 2017-06-08 ENCOUNTER — Encounter: Payer: Self-pay | Admitting: Hematology

## 2017-06-08 ENCOUNTER — Ambulatory Visit (INDEPENDENT_AMBULATORY_CARE_PROVIDER_SITE_OTHER): Payer: PPO | Admitting: Psychiatry

## 2017-06-08 ENCOUNTER — Encounter (HOSPITAL_COMMUNITY): Payer: Self-pay | Admitting: Psychiatry

## 2017-06-08 ENCOUNTER — Telehealth: Payer: Self-pay

## 2017-06-08 ENCOUNTER — Inpatient Hospital Stay: Payer: PPO | Attending: Hematology

## 2017-06-08 VITALS — BP 130/61 | HR 75 | Temp 98.7°F | Resp 17 | Ht 63.0 in | Wt 184.6 lb

## 2017-06-08 DIAGNOSIS — F333 Major depressive disorder, recurrent, severe with psychotic symptoms: Secondary | ICD-10-CM | POA: Diagnosis not present

## 2017-06-08 DIAGNOSIS — Z813 Family history of other psychoactive substance abuse and dependence: Secondary | ICD-10-CM

## 2017-06-08 DIAGNOSIS — Z818 Family history of other mental and behavioral disorders: Secondary | ICD-10-CM | POA: Diagnosis not present

## 2017-06-08 DIAGNOSIS — F410 Panic disorder [episodic paroxysmal anxiety] without agoraphobia: Secondary | ICD-10-CM

## 2017-06-08 DIAGNOSIS — B181 Chronic viral hepatitis B without delta-agent: Secondary | ICD-10-CM | POA: Insufficient documentation

## 2017-06-08 DIAGNOSIS — M8580 Other specified disorders of bone density and structure, unspecified site: Secondary | ICD-10-CM | POA: Insufficient documentation

## 2017-06-08 DIAGNOSIS — C8298 Follicular lymphoma, unspecified, lymph nodes of multiple sites: Secondary | ICD-10-CM | POA: Insufficient documentation

## 2017-06-08 DIAGNOSIS — Z79899 Other long term (current) drug therapy: Secondary | ICD-10-CM | POA: Insufficient documentation

## 2017-06-08 DIAGNOSIS — F4310 Post-traumatic stress disorder, unspecified: Secondary | ICD-10-CM

## 2017-06-08 DIAGNOSIS — C8294 Follicular lymphoma, unspecified, lymph nodes of axilla and upper limb: Secondary | ICD-10-CM

## 2017-06-08 DIAGNOSIS — K219 Gastro-esophageal reflux disease without esophagitis: Secondary | ICD-10-CM | POA: Diagnosis not present

## 2017-06-08 DIAGNOSIS — M1990 Unspecified osteoarthritis, unspecified site: Secondary | ICD-10-CM | POA: Diagnosis not present

## 2017-06-08 DIAGNOSIS — D696 Thrombocytopenia, unspecified: Secondary | ICD-10-CM | POA: Insufficient documentation

## 2017-06-08 DIAGNOSIS — Z811 Family history of alcohol abuse and dependence: Secondary | ICD-10-CM | POA: Diagnosis not present

## 2017-06-08 LAB — CMP (CANCER CENTER ONLY)
ALBUMIN: 3.6 g/dL (ref 3.5–5.0)
ALK PHOS: 122 U/L (ref 40–150)
ALT: 14 U/L (ref 0–55)
ANION GAP: 6 (ref 3–11)
AST: 24 U/L (ref 5–34)
BUN: 16 mg/dL (ref 7–26)
CHLORIDE: 107 mmol/L (ref 98–109)
CO2: 27 mmol/L (ref 22–29)
Calcium: 9 mg/dL (ref 8.4–10.4)
Creatinine: 1.05 mg/dL (ref 0.60–1.10)
GFR, Est AFR Am: 60 mL/min — ABNORMAL LOW (ref >60)
GFR, Estimated: 52 mL/min — ABNORMAL LOW (ref >60)
GLUCOSE: 96 mg/dL (ref 70–140)
POTASSIUM: 4.1 mmol/L (ref 3.5–5.1)
SODIUM: 140 mmol/L (ref 136–145)
Total Bilirubin: 0.7 mg/dL (ref 0.2–1.2)
Total Protein: 6.2 g/dL — ABNORMAL LOW (ref 6.4–8.3)

## 2017-06-08 LAB — CBC WITH DIFFERENTIAL (CANCER CENTER ONLY)
Basophils Absolute: 0 10*3/uL (ref 0.0–0.1)
Basophils Relative: 1 %
Eosinophils Absolute: 0.2 10*3/uL (ref 0.0–0.5)
Eosinophils Relative: 4 %
HCT: 34.6 % — ABNORMAL LOW (ref 34.8–46.6)
HEMOGLOBIN: 11 g/dL — AB (ref 11.6–15.9)
LYMPHS PCT: 28 %
Lymphs Abs: 1.5 10*3/uL (ref 0.9–3.3)
MCH: 29.2 pg (ref 25.1–34.0)
MCHC: 31.8 g/dL (ref 31.5–36.0)
MCV: 91.8 fL (ref 79.5–101.0)
MONO ABS: 0.8 10*3/uL (ref 0.1–0.9)
MONOS PCT: 16 %
NEUTROS PCT: 51 %
Neutro Abs: 2.6 10*3/uL (ref 1.5–6.5)
Platelet Count: 128 10*3/uL — ABNORMAL LOW (ref 145–400)
RBC: 3.77 MIL/uL (ref 3.70–5.45)
RDW: 14.6 % — AB (ref 11.2–14.5)
WBC Count: 5.2 10*3/uL (ref 3.9–10.3)

## 2017-06-08 LAB — RETICULOCYTES
RBC.: 3.77 MIL/uL (ref 3.70–5.45)
RETIC COUNT ABSOLUTE: 64.1 10*3/uL (ref 33.7–90.7)
Retic Ct Pct: 1.7 % (ref 0.7–2.1)

## 2017-06-08 LAB — LACTATE DEHYDROGENASE: LDH: 171 U/L (ref 125–245)

## 2017-06-08 MED ORDER — SERTRALINE HCL 50 MG PO TABS: 50.0000 mg | ORAL_TABLET | Freq: Every day | ORAL | 0 refills | Status: DC

## 2017-06-08 MED ORDER — RISPERIDONE 0.5 MG PO TABS: 0.5000 mg | ORAL_TABLET | Freq: Every day | ORAL | 0 refills | Status: DC

## 2017-06-08 NOTE — Telephone Encounter (Signed)
Unable to completely schedule all  Appointments due to caps day in infusion per 5/9 los

## 2017-06-08 NOTE — Patient Instructions (Signed)
Thank you for choosing [facility] to provide your oncology and hematology care.  To afford each patient quality time with our providers, please arrive 30 minutes before your scheduled appointment time.  If you arrive late for your appointment, you may be asked to reschedule.  We strive to give you quality time with our providers, and arriving late affects you and other patients whose appointments are after yours.   If you are a no show for multiple scheduled visits, you may be dismissed from the clinic at the providers discretion.    Again, thank you for choosing [facility], our hope is that these requests will decrease the amount of time that you wait before being seen by our physicians.  ______________________________________________________________________  Should you have questions after your visit to the [facility], please contact our office at [phone] between the hours of 8:30 and 4:30 p.m.    Voicemails left after 4:30p.m will not be returned until the following business day.    For prescription refill requests, please have your pharmacy contact us directly.  Please also try to allow 48 hours for prescription requests.    Please contact the scheduling department for questions regarding scheduling.  For scheduling of procedures such as PET scans, CT scans, MRI, Ultrasound, etc please contact central scheduling at [phone].    Resources For Cancer Patients and Caregivers:   Oncolink.org:  A wonderful resource for patients and healthcare providers for information regarding your disease, ways to tract your treatment, what to expect, etc.     American Cancer Society:  800-227-2345  Can help patients locate various types of support and financial assistance  Cancer Care: 1-800-813-HOPE (4673) Provides financial assistance, online support groups, medication/co-pay assistance.    [place] DSS:  336-641-3447 Where to apply for food  stamps, Medicaid, and utility assistance  Medicare Rights Center: 800-333-4114 Helps people with Medicare understand their rights and benefits, navigate the Medicare system, and secure the quality healthcare they deserve  [employer]: 336-333-6589  [employer]'s shared-ride transportation service for eligible riders who have a disability that prevents them from riding the fixed route bus.    For additional information on assistance programs please contact our social worker:   Grier Hock/Abigail Elmore:  [phone]            

## 2017-06-08 NOTE — Progress Notes (Signed)
BH MD/PA/NP OP Progress Note  06/08/2017 10:52 AM Brooke Huber  MRN:  [id number]  Chief Complaint:  Chief Complaint    Follow-up     HPI: "I guess I am falling apart". She was diagnosed with cancer last month. She is very worried and will find out this afternoon what treatment she will be taking. She is on meds for Hep B. At a recent beach trip she fell and broke her arm. Pt is anxious and scared. She has racing thoughts and insomnia. She is worried about treatment. She wonders how bad is it going get and will she be able to pull thru. Pt denies any recnet panic attacks. The depression comes and goes. She tries to distract herself with activities. Pt is staying with a friend for 6 weeks and it helps. Pt denies SI/HI. Pt denies PTSD symptoms. Pt is taking meds as prescribed and denies SE.   Visit Diagnosis:    ICD-10-CM   1. Severe episode of recurrent major depressive disorder, with psychotic features ([place]) F33.3 risperiDONE (RISPERDAL) 0.5 MG tablet    sertraline (ZOLOFT) 50 MG tablet  2. PTSD (post-traumatic stress disorder) F43.10 risperiDONE (RISPERDAL) 0.5 MG tablet    sertraline (ZOLOFT) 50 MG tablet  3. 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 Patient was hospitalized 3 others times in 1993, 1994 for suicidal ideation and and 1997 after her divorce. History of Electroconvulsive Shock Therapy:No Prior Suicide Attempts:No    Past Medical History:  Past Medical History:  Diagnosis Date  . 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 Cancer [facility])    breast lymphoma with mets  . Depression   . GERD (gastroesophageal reflux disease)   .  Hepatitis   . Hepatitis B 02/1995  . Seizures ([place])    hx of seizure in 1993 and 1994 - caused by stress per patient     Past Surgical History:  Procedure Laterality Date  . APPENDECTOMY    . COLONOSCOPY WITH PROPOFOL N/A 12/29/2015   Procedure: COLONOSCOPY WITH PROPOFOL;  Surgeon: Garlan Fair, MD;  Location: [facility];  Service: Endoscopy;  Laterality: N/A;  . TONSILLECTOMY AND ADENOIDECTOMY Bilateral 1954  . TUBAL LIGATION      Family Psychiatric History: Family History  Problem Relation Age of Onset  . Alzheimer's disease Sister   . Dementia Sister   . Sexual abuse Sister   . Seizures Sister   . Alcohol abuse Brother   . Alcohol abuse Maternal Uncle   . Drug abuse Cousin   . Drug abuse Son   . Depression Neg Hx     Social History:  Social History   Socioeconomic History  . Marital status: Single    Spouse name: Not on file  . Number of children: Not on file  . Years of education: Not on file  . Highest education level: Not on file  Occupational History  . Not on file  Social Needs  . Financial resource strain: Not on file  . Food insecurity:    Worry: Not on file    Inability: Not on file  . Transportation needs:    Medical: Not on file    Non-medical: Not on file  Tobacco Use  . Smoking status: Never Smoker  .  Smokeless tobacco: Never Used  Substance and Sexual Activity  . Alcohol use: No  . Drug use: No  . Sexual activity: Never    Birth control/protection: None  Lifestyle  . Physical activity:    Days per week: Not on file    Minutes per session: Not on file  . Stress: Not on file  Relationships  . Social connections:    Talks on phone: Not on file    Gets together: Not on file    Attends religious service: Not on file    Active member of club or organization: Not on file    Attends meetings of clubs or organizations: Not on file    Relationship status: Not on file  Other Topics Concern  . Not on file  Social History Narrative  .  Not on file    Allergies:  Allergies  Allergen Reactions  . Penicillins Hives and Rash    Metabolic Disorder Labs: Lab Results  Component Value Date   HGBA1C 5.5 10/06/2016   MPG 108 11/10/2015   MPG 123 06/10/2014   Lab Results  Component Value Date   PROLACTIN 37.0 (H) 10/06/2016   PROLACTIN 22.1 11/10/2015   Lab Results  Component Value Date   CHOL 140 10/06/2016   TRIG 81 10/06/2016   HDL 38 (L) 10/06/2016   CHOLHDL 2.7 06/10/2014   VLDL 14 06/10/2014   LDLCALC 86 10/06/2016   LDLCALC 67 06/10/2014   Lab Results  Component Value Date   TSH 1.350 10/06/2016   TSH 1.42 11/10/2015    Therapeutic Level Labs: No results found for: LITHIUM No results found for: VALPROATE No components found for:  CBMZ  Current Medications: Current Outpatient Medications  Medication Sig Dispense Refill  . Calcium Carb-Cholecalciferol 600-100 MG-UNIT CAPS Take 1 tablet by mouth daily.    Marland Kitchen HYDROcodone-acetaminophen (NORCO/VICODIN) 5-325 MG tablet TK 1 T PO  Q 4 H PRN P  0  . risperiDONE (RISPERDAL) 0.5 MG tablet Take 1 tablet (0.5 mg total) by mouth at bedtime. 90 tablet 0  . sertraline (ZOLOFT) 50 MG tablet Take 1 tablet (50 mg total) by mouth daily. 90 tablet 0  . tenofovir (VIREAD) 300 MG tablet Take 1 tablet (300 mg total) by mouth daily. 30 tablet 6  . Tenofovir Alafenamide Fumarate 25 MG TABS Take 1 tablet (25 mg total) by mouth daily. 30 tablet 5  . vitamin C (ASCORBIC ACID) 500 MG tablet Take 500 mg by mouth daily.     No current facility-administered medications for this visit.      Musculoskeletal: Strength & Muscle Tone: within normal limits Gait & Station: normal Patient leans: N/A  Psychiatric Specialty Exam: Review of Systems  Musculoskeletal: Negative for back pain, joint pain, myalgias and neck pain.       Broke right arm   Neurological: Negative for dizziness, tingling, sensory change, speech change and headaches.    Blood pressure 104/64, pulse 76,  height 5\' 3"  (1.6 m), weight 184 lb (83.5 kg), SpO2 97 %.Body mass index is 32.59 kg/m.  General Appearance: Casual  Eye Contact:  Good  Speech:  Clear and Coherent and Slow  Volume:  Decreased  Mood:  Anxious  Affect:  Blunt  Thought Process:  Goal Directed and Descriptions of Associations: Intact  Orientation:  Full (Time, Place, and Person)  Thought Content: Logical   Suicidal Thoughts:  No  Homicidal Thoughts:  No  Memory:  Immediate;   Good Recent;   Good Remote;  Good  Judgement:  Good  Insight:  Good  Psychomotor Activity:  Normal  Concentration:  Concentration: Good and Attention Span: Good  Recall:  Good  Fund of Knowledge: Good  Language: Good  Akathisia:  No  Handed:  Right  AIMS (if indicated): not done  Assets:  Communication Skills Desire for Improvement Housing Social Support  ADL's:  Intact  Cognition: WNL  Sleep:  Poor   Screenings: AIMS     Office Visit from 10/06/2016 in [facility] Admission (Discharged) from 06/08/2014 in [facility]  AIMS Total Score  0  0    AUDIT     Admission (Discharged) from 06/08/2014 in [facility]  Alcohol Use Disorder Identification Test Final Score (AUDIT)  0    PHQ2-9     Office Visit from 05/23/2017 in [facility] for [facility]ious Disease  PHQ-2 Total Score  0       Assessment and Plan: MDD-severe with psychotic features; PTSD; panic disorder without agoraphobia    Medication management with supportive therapy. Risks and benefits, side effects and alternative treatment options discussed with patient. Pt was given an opportunity to ask questions about medication, illness, and treatment. All current psychiatric medications have been reviewed and discussed with the patient and adjusted as clinically appropriate. The patient has been provided an accurate and updated list of the medications being now  prescribed. Patient expressed understanding of how their medications were to be used.  Pt verbalized understanding and verbal consent obtained for treatment.  The risk of un-intended pregnancy is low based on the fact that pt reports she is postmenopausal. Pt is aware that these meds carry a teratogenic risk. Pt will discuss plan of action if she does or plans to become pregnant in the future.  Status of current problems: overall unchanged  Meds: Zoloft 50 mg p.o. daily for MDD, PTSD and panic disorder Risperdal 0.5 mg p.o. nightly for psychosis related to MDD   Labs: 05/02/17 platelets 122  Therapy: brief supportive therapy provided. Discussed psychosocial stressors in detail.     Consultations: Encouraged to follow up with PCP and oncologist  Pt denies SI and is at an acute low risk for suicide. Patient told to call clinic if any problems occur. Patient advised to go to ER if they should develop SI/HI, side effects, or if symptoms worsen. Has crisis numbers to call if needed. Pt verbalized understanding.  F/up in 3 months or sooner if needed    Charlcie Cradle, MD 06/08/2017, 10:52 AM

## 2017-06-08 NOTE — Telephone Encounter (Signed)
Per 5/9 no los 

## 2017-06-14 ENCOUNTER — Other Ambulatory Visit: Payer: Self-pay | Admitting: Hematology

## 2017-06-14 DIAGNOSIS — C8218 Follicular lymphoma grade II, lymph nodes of multiple sites: Secondary | ICD-10-CM | POA: Insufficient documentation

## 2017-06-14 MED ORDER — ONDANSETRON HCL 8 MG PO TABS: 8.0000 mg | ORAL_TABLET | Freq: Two times a day (BID) | ORAL | 1 refills | Status: DC | PRN

## 2017-06-14 MED ORDER — ALLOPURINOL 300 MG PO TABS
150.0000 mg | ORAL_TABLET | Freq: Every day | ORAL | 0 refills | Status: DC
Start: 2017-06-14 — End: 2017-06-30

## 2017-06-14 MED ORDER — DEXAMETHASONE 4 MG PO TABS: 8.0000 mg | ORAL_TABLET | Freq: Every day | ORAL | 1 refills | Status: DC

## 2017-06-14 MED ORDER — LORAZEPAM 0.5 MG PO TABS: 0.5000 mg | ORAL_TABLET | Freq: Four times a day (QID) | ORAL | 0 refills | Status: DC | PRN

## 2017-06-14 MED ORDER — ACYCLOVIR 400 MG PO TABS: 400.0000 mg | ORAL_TABLET | Freq: Every day | ORAL | 3 refills | Status: DC

## 2017-06-14 MED ORDER — PROCHLORPERAZINE MALEATE 10 MG PO TABS: 10.0000 mg | ORAL_TABLET | Freq: Four times a day (QID) | ORAL | 1 refills | Status: DC | PRN

## 2017-06-14 NOTE — Progress Notes (Signed)
START ON PATHWAY REGIMEN - Lymphoma and CLL     A cycle is every 28 days:     Bendamustine      Rituximab   **Always confirm dose/schedule in your pharmacy ordering system**  Patient Characteristics: Follicular Lymphoma, Grades 1, 2, and 3A, First Line, Stage III / IV, Symptomatic or Bulky Disease Disease Type: Follicular Lymphoma Disease Type: Not Applicable Disease Type: Not Applicable [place] Stage: IV Tumor Grade: 2 Line of therapy: First Line Disease Characteristics: Symptomatic or Bulky Disease Intent of Therapy: Non-Curative / Palliative Intent, Discussed with Patient 

## 2017-06-15 ENCOUNTER — Ambulatory Visit
Admission: RE | Admit: 2017-06-15 | Discharge: 2017-06-15 | Disposition: A | Discharge status: Home / Self Care | Payer: PPO | Source: Ambulatory Visit | Attending: Internal Medicine | Admitting: Internal Medicine

## 2017-06-15 DIAGNOSIS — K7689 Other specified diseases of liver: Secondary | ICD-10-CM | POA: Diagnosis not present

## 2017-06-15 DIAGNOSIS — B181 Chronic viral hepatitis B without delta-agent: Secondary | ICD-10-CM

## 2017-06-21 ENCOUNTER — Telehealth: Payer: Self-pay | Admitting: Behavioral Health

## 2017-06-21 NOTE — Telephone Encounter (Addendum)
Patient called stating she is unable to afford her Shirlee Latch which is costing her 15.66 which she cannot afford.  Spoke to [employer] and she will apply for the PAF program tomorrow to help with the cost.  Called patient back to infom her of this, patient verbalized understanding.  Will call patient back once further information given. Pricilla Riffle RN

## 2017-06-22 NOTE — Telephone Encounter (Signed)
Pt called again following up on a conversation she had with the pt about some assistance on her Vemlidy. Stated she had ordered some more medication is concerned she will have to pay the copay for the medication. Pay stated that she is unable to this copay and was told by RN that someone at our office will be helping her with assistance. PT would like a call back by the end of the day. Ewa Gentry

## 2017-06-22 NOTE — Telephone Encounter (Signed)
Another option is tenofovir DF (Viread) if we can see if that is cheaper

## 2017-06-23 NOTE — Telephone Encounter (Addendum)
Brooke Huber was able to get coverage for Ms. Herst to cover her [facility].  It was sent to [facility] [address] in [place].  Per Nicola Girt., patient was made aware. Pricilla Riffle RN

## 2017-06-29 ENCOUNTER — Inpatient Hospital Stay: Payer: PPO

## 2017-06-29 ENCOUNTER — Other Ambulatory Visit: Payer: Self-pay

## 2017-06-29 DIAGNOSIS — C8298 Follicular lymphoma, unspecified, lymph nodes of multiple sites: Secondary | ICD-10-CM | POA: Diagnosis not present

## 2017-06-29 LAB — CBC WITH DIFFERENTIAL/PLATELET
BASOS PCT: 1 %
Basophils Absolute: 0.1 10*3/uL (ref 0.0–0.1)
EOS ABS: 0.2 10*3/uL (ref 0.0–0.5)
EOS PCT: 4 %
HCT: 36.1 % (ref 34.8–46.6)
HEMOGLOBIN: 11.8 g/dL (ref 11.6–15.9)
Lymphocytes Relative: 30 %
Lymphs Abs: 1.4 10*3/uL (ref 0.9–3.3)
MCH: 28.5 pg (ref 25.1–34.0)
MCHC: 32.6 g/dL (ref 31.5–36.0)
MCV: 87.5 fL (ref 79.5–101.0)
Monocytes Absolute: 0.4 10*3/uL (ref 0.1–0.9)
Monocytes Relative: 9 %
NEUTROS PCT: 56 %
Neutro Abs: 2.8 10*3/uL (ref 1.5–6.5)
PLATELETS: 120 10*3/uL — AB (ref 145–400)
RBC: 4.12 MIL/uL (ref 3.70–5.45)
RDW: 14.2 % (ref 11.2–14.5)
WBC: 4.9 10*3/uL (ref 3.9–10.3)

## 2017-06-29 LAB — CMP (CANCER CENTER ONLY)
ALK PHOS: 116 U/L (ref 40–150)
ALT: 12 U/L (ref 0–55)
AST: 24 U/L (ref 5–34)
Albumin: 3.7 g/dL (ref 3.5–5.0)
Anion gap: 10 (ref 3–11)
BILIRUBIN TOTAL: 0.5 mg/dL (ref 0.2–1.2)
BUN: 20 mg/dL (ref 7–26)
CALCIUM: 8.9 mg/dL (ref 8.4–10.4)
CO2: 27 mmol/L (ref 22–29)
CREATININE: 1.08 mg/dL (ref 0.60–1.10)
Chloride: 105 mmol/L (ref 98–109)
GFR, EST AFRICAN AMERICAN: 58 mL/min — AB (ref >60)
GFR, EST NON AFRICAN AMERICAN: 50 mL/min — AB (ref >60)
Glucose, Bld: 132 mg/dL (ref 70–140)
Potassium: 3.7 mmol/L (ref 3.5–5.1)
Sodium: 142 mmol/L (ref 136–145)
Total Protein: 6.4 g/dL (ref 6.4–8.3)

## 2017-06-29 LAB — LACTATE DEHYDROGENASE: LDH: 178 U/L (ref 125–245)

## 2017-06-30 ENCOUNTER — Other Ambulatory Visit: Payer: Self-pay | Admitting: *Deleted

## 2017-06-30 ENCOUNTER — Telehealth: Payer: Self-pay

## 2017-06-30 ENCOUNTER — Other Ambulatory Visit: Payer: Self-pay

## 2017-06-30 DIAGNOSIS — C8218 Follicular lymphoma grade II, lymph nodes of multiple sites: Secondary | ICD-10-CM

## 2017-06-30 MED ORDER — LORAZEPAM 0.5 MG PO TABS: 0.5000 mg | ORAL_TABLET | Freq: Four times a day (QID) | ORAL | 0 refills | Status: DC | PRN

## 2017-06-30 MED ORDER — ACYCLOVIR 400 MG PO TABS
400.0000 mg | ORAL_TABLET | Freq: Every day | ORAL | 3 refills | Status: DC
Start: 2017-06-30 — End: 2018-06-12

## 2017-06-30 MED ORDER — ONDANSETRON HCL 8 MG PO TABS: 8.0000 mg | ORAL_TABLET | Freq: Two times a day (BID) | ORAL | 1 refills | Status: DC | PRN

## 2017-06-30 MED ORDER — DEXAMETHASONE 4 MG PO TABS: 8.0000 mg | ORAL_TABLET | Freq: Every day | ORAL | 1 refills | Status: DC

## 2017-06-30 MED ORDER — ALLOPURINOL 300 MG PO TABS: 150.0000 mg | ORAL_TABLET | Freq: Every day | ORAL | 0 refills | Status: DC

## 2017-06-30 MED ORDER — PROCHLORPERAZINE MALEATE 10 MG PO TABS: 10.0000 mg | ORAL_TABLET | Freq: Four times a day (QID) | ORAL | 1 refills | Status: DC | PRN

## 2017-06-30 NOTE — Progress Notes (Signed)
Reviewed pt orders to discuss with scheduling.

## 2017-06-30 NOTE — Telephone Encounter (Signed)
Prescription confirmed cancellation at old pharmacy and acyclovir, compazine, allopurinol, zofran, and decadron sent in to [facility] on [address]. Ativan called-in with pharmacist. Pt called and made aware of orders placed.

## 2017-07-01 LAB — HEPATITIS B DNA, ULTRAQUANTITATIVE, PCR
HBV DNA SERPL PCR-ACNC: NOT DETECTED [IU]/mL
HBV DNA SERPL PCR-LOG IU: UNDETERMINED {Log_IU}/mL

## 2017-07-04 ENCOUNTER — Ambulatory Visit (INDEPENDENT_AMBULATORY_CARE_PROVIDER_SITE_OTHER): Payer: PPO

## 2017-07-04 ENCOUNTER — Ambulatory Visit (INDEPENDENT_AMBULATORY_CARE_PROVIDER_SITE_OTHER): Payer: PPO | Admitting: Orthopaedic Surgery

## 2017-07-04 DIAGNOSIS — S42331A Displaced oblique fracture of shaft of humerus, right arm, initial encounter for closed fracture: Secondary | ICD-10-CM

## 2017-07-04 MED ORDER — CALCIUM CARBONATE-VITAMIN D 500-200 MG-UNIT PO TABS: 1.0000 | ORAL_TABLET | Freq: Three times a day (TID) | ORAL | 12 refills | Status: DC

## 2017-07-04 NOTE — Progress Notes (Signed)
Post-Op Visit Note   Patient: Brooke Huber           Date of Birth: [birth year]           MRN: [id number] Visit Date: 07/04/2017 PCP: Alroy Dust, L.Marlou Sa, MD   Assessment & Plan:  Chief Complaint:  Chief Complaint  Patient presents with  . Right Upper Arm - Follow-up    4/19/19closed displace oblique fracture of shaft of right humerus   Visit Diagnoses:  1. Closed displaced oblique fracture of shaft of right humerus, initial encounter     Plan: Patient is approximately 6 weeks status post midshaft humerus fracture.  We have been treating this with a Sarmiento brace.  She is feeling better control.  Her swelling is improving.  She is.  Her x-rays show evidence of early callus formation and healing.  At this point I like to continue Sarmiento for 2 more weeks.  She may discontinue the shoulder sling.  Follow-up in 2 weeks with 2 view x-rays of the right humerus out of the Sarmiento.  Prescription for vitamin D and calcium.  Follow-Up Instructions: Return in about 2 weeks (around 07/18/2017).   Orders:  Orders Placed This Encounter  Procedures  . XR Humerus Right   Meds ordered this encounter  Medications  . calcium-vitamin D (OSCAL WITH D) 500-200 MG-UNIT tablet    Sig: Take 1 tablet by mouth 3 (three) times daily.    Dispense:  90 tablet    Refill:  12    Imaging: Xr Humerus Right  Result Date: 07/04/2017 Stable alignment of midshaft fracture with evidence of callus formation   PMFS History: Patient Active Problem List   Diagnosis Date Noted  . 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 Closed displaced oblique fracture of shaft of right humerus 05/26/2017  . Chronic viral hepatitis B without delta-agent ([place]) 05/23/2017  . MDD (major depressive disorder), recurrent, severe, with psychosis ([place]) 06/09/2014  . PTSD (post-traumatic stress disorder) 06/09/2014  . Panic disorder 06/09/2014  . Bereavement 06/09/2014  .  Hypokalemia 06/09/2014  . History of periportal fibrosis 08/31/2006   Past Medical History:  Diagnosis Date  . 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 Cancer [facility])    breast lymphoma with mets  . Depression   . GERD (gastroesophageal reflux disease)   . Hepatitis   . Hepatitis B 02/1995  . Seizures ([place])    hx of seizure in 1993 and 1994 - caused by stress per patient     Family History  Problem Relation Age of Onset  . Alzheimer's disease Sister   . Dementia Sister   . Sexual abuse Sister   . Seizures Sister   . Alcohol abuse Brother   . Alcohol abuse Maternal Uncle   . Drug abuse Cousin   . Drug abuse Son   . Depression Neg Hx     Past Surgical History:  Procedure Laterality Date  . APPENDECTOMY    . COLONOSCOPY WITH PROPOFOL N/A 12/29/2015   Procedure: COLONOSCOPY WITH PROPOFOL;  Surgeon: Garlan Fair, MD;  Location: [facility];  Service: Endoscopy;  Laterality: N/A;  . TONSILLECTOMY AND ADENOIDECTOMY Bilateral 1954  . TUBAL LIGATION     Social History   Occupational History  . Not on file  Tobacco Use  . Smoking status: Never Smoker  . Smokeless tobacco: Never Used  Substance and Sexual Activity  . Alcohol use: No  . Drug use:  No  . Sexual activity: Never    Birth control/protection: None

## 2017-07-11 NOTE — Progress Notes (Signed)
HEMATOLOGY/ONCOLOGY CLINIC NOTE  Date of Service: 07/12/17  Patient Care Team: Alroy Dust, L.Marlou Sa, MD as PCP - General (Family Medicine)  CHIEF COMPLAINTS/PURPOSE OF CONSULTATION:  F/u for mx of recently diagnosed Follicular Lymphoma  HISTORY OF PRESENTING ILLNESS:    Brooke Huber is a wonderful 72 y.o. female who has been referred to [name] by her PCP, Dr. 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 She presents to the clinic today accompanied by her friend.   She had a yearly screening mammogram on 04/17/17 which found her to have an enlarged axillary lymph nodes. She had a biopsy on 04/28/17 which showed evidence of lymphoma.   Today she notes she feels fine in general with no changes over the last 6 months. She hopes to go to the beach on the 18th of this month with her friend for 5 days.   She notes years ago in the late 1990s she went to donate blood and they told her she had Hep B, but never had liver damage/change or concerning symptoms. She never had been out of the country, tattoo or blood transfusion. She denies any other major comorbidities. She sees a psychiatrist every 4 months for her depression and anxiety. She notes diagnosis like this can trigger her depression but she is working to maintain her mood during this time.   On review of symptoms, pt notes limited ROM of her left arm for the last 2 years with tenderness. She notes arthritis in her hands and possibly in her shoulder. She denies bowel issues or trouble with bowel movements or passing urine.  Interval History:  Brooke Huber returns today regarding her low grade Follicular Lymphoma. The patient's last visit with [name] was on 06/08/17. She is accompanied today by her friend. The pt reports that she is doing well overall.   The pt reports that she will see ID Dr Linus Salmons on 07/17/17 next. She has maintained compliance taking Tenofovir. In the interim she has also attended chemotherapy  counseling.   She also notes that her shoulder injury has been healing well and the associated pain is well controlled. From the same fall, her leg swelling has been resolving and the pt is keeping her legs elevated.   Of note since the patient's last visit, pt has had [name] RUQ Abdomen completed on 06/15/17 with results revealing 1. Contracted gallbladder. Probable gallstones. No biliary distention. 2. Multiple cysts in the liver consistent with benign cysts. No solid hepatic abnormality identified.  Lab results today (07/12/17) of CBC, CMP is as follows: all values are WNL except for Hgb at 11.2, HCT at 34., Platelets at 126k, Total Protein at 6.0, Albumin at 3.4. LDH 07/12/17 is WNL at 162 Hep B DNA, PCR 06/29/17 was not detected.   On review of systems, pt reports resolving leg swelling, healing shoulder injury, and denies abdominal pains, and any other symptoms.    MEDICAL HISTORY:  Past Medical History:  Diagnosis Date  . 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 Cancer [facility])    breast lymphoma with mets  . Depression   . GERD (gastroesophageal reflux disease)   . Hepatitis   . Hepatitis B 02/1995  . Seizures ([place])    hx of seizure in 1993 and 1994 - caused by stress per patient   -Diverticulitis  -Osteopenia  -Acute gastritis   SURGICAL HISTORY: Past Surgical History:  Procedure Laterality Date  . APPENDECTOMY    . COLONOSCOPY WITH PROPOFOL  N/A 12/29/2015   Procedure: COLONOSCOPY WITH PROPOFOL;  Surgeon: Garlan Fair, MD;  Location: [facility] ENDOSCOPY;  Service: Endoscopy;  Laterality: N/A;  . TONSILLECTOMY AND ADENOIDECTOMY Bilateral 1954  . TUBAL LIGATION      SOCIAL HISTORY: Social History   Socioeconomic History  . Marital status: Single    Spouse name: Not on file  . Number of children: Not on file  . Years of education: Not on file  . Highest education level: Not on file  Occupational History  . Not on file  Social Needs  . Financial resource strain: Not on  file  . Food insecurity:    Worry: Not on file    Inability: Not on file  . Transportation needs:    Medical: Not on file    Non-medical: Not on file  Tobacco Use  . Smoking status: Never Smoker  . Smokeless tobacco: Never Used  Substance and Sexual Activity  . Alcohol use: No  . Drug use: No  . Sexual activity: Never    Birth control/protection: None  Lifestyle  . Physical activity:    Days per week: Not on file    Minutes per session: Not on file  . Stress: Not on file  Relationships  . Social connections:    Talks on phone: Not on file    Gets together: Not on file    Attends religious service: Not on file    Active member of club or organization: Not on file    Attends meetings of clubs or organizations: Not on file    Relationship status: Not on file  . Intimate partner violence:    Fear of current or ex partner: Not on file    Emotionally abused: Not on file    Physically abused: Not on file    Forced sexual activity: Not on file  Other Topics Concern  . Not on file  Social History Narrative  . Not on file    FAMILY HISTORY: Family History  Problem Relation Age of Onset  . Alzheimer's disease Sister   . Dementia Sister   . Sexual abuse Sister   . Seizures Sister   . Alcohol abuse Brother   . Alcohol abuse Maternal Uncle   . Drug abuse Cousin   . Drug abuse Son   . Depression Neg Hx     ALLERGIES:  is allergic to penicillins.  MEDICATIONS:  Current Outpatient Medications  Medication Sig Dispense Refill  . acyclovir (ZOVIRAX) 400 MG tablet Take 1 tablet (400 mg total) by mouth daily. 30 tablet 3  . allopurinol (ZYLOPRIM) 300 MG tablet Take 0.5 tablets (150 mg total) by mouth daily. 30 tablet 0  . Calcium Carb-Cholecalciferol 600-100 MG-UNIT CAPS Take 1 tablet by mouth daily.    . calcium-vitamin D (OSCAL WITH D) 500-200 MG-UNIT tablet Take 1 tablet by mouth 3 (three) times daily. 90 tablet 12  . dexamethasone (DECADRON) 4 MG tablet Take 2 tablets (8  mg total) by mouth daily. Start the day after bendamustine chemotherapy for 2 days. Take with food. 30 tablet 1  . HYDROcodone-acetaminophen (NORCO/VICODIN) 5-325 MG tablet TK 1 T PO  Q 4 H PRN P  0  . LORazepam (ATIVAN) 0.5 MG tablet Take 1 tablet (0.5 mg total) by mouth every 6 (six) hours as needed (Nausea or vomiting). 30 tablet 0  . ondansetron (ZOFRAN) 8 MG tablet Take 1 tablet (8 mg total) by mouth 2 (two) times daily as needed for refractory nausea /  vomiting. Start on day 2 after bendamustine chemo. 30 tablet 1  . prochlorperazine (COMPAZINE) 10 MG tablet Take 1 tablet (10 mg total) by mouth every 6 (six) hours as needed (Nausea or vomiting). 30 tablet 1  . risperiDONE (RISPERDAL) 0.5 MG tablet Take 1 tablet (0.5 mg total) by mouth at bedtime. 90 tablet 0  . sertraline (ZOLOFT) 50 MG tablet Take 1 tablet (50 mg total) by mouth daily. 90 tablet 0  . tenofovir (VIREAD) 300 MG tablet Take 1 tablet (300 mg total) by mouth daily. 30 tablet 6  . Tenofovir Alafenamide Fumarate 25 MG TABS Take 1 tablet (25 mg total) by mouth daily. 30 tablet 5  . vitamin C (ASCORBIC ACID) 500 MG tablet Take 500 mg by mouth daily.     No current facility-administered medications for this visit.     REVIEW OF SYSTEMS:   A 10+ POINT REVIEW OF SYSTEMS WAS OBTAINED including neurology, dermatology, psychiatry, cardiac, respiratory, lymph, extremities, GI, GU, Musculoskeletal, constitutional, breasts, reproductive, HEENT.  All pertinent positives are noted in the HPI.  All others are negative.   PHYSICAL EXAMINATION: ECOG PERFORMANCE STATUS: 1 - Symptomatic but completely ambulatory  Vitals:   07/12/17 0837  BP: 113/60  Pulse: 68  Resp: 18  Temp: 98.4 F (36.9 C)  SpO2: 98%   Filed Weights   07/12/17 0837  Weight: 183 lb 11.2 oz (83.3 kg)   .Body mass index is 32.54 kg/m.  GENERAL:alert, in no acute distress and comfortable SKIN: no acute rashes, no significant lesions EYES: conjunctiva are pink and  non-injected, sclera anicteric OROPHARYNX: MMM, no exudates, no oropharyngeal erythema or ulceration NECK: supple, no JVD LYMPH:  no palpable lymphadenopathy in the inguinal regions, (+) small palpable LN in the upper neck and middle right neck, very small palpable lymph nodes in the right axilla LUNGS: clear to auscultation b/l with normal respiratory effort HEART: regular rate & rhythm ABDOMEN:  normoactive bowel sounds , non tender, not distended. Extremity: no pedal edema PSYCH: alert & oriented x 3 with fluent speech NEURO: no focal motor/sensory deficits   LABORATORY DATA:  I have reviewed the data as listed  . CBC Latest Ref Rng & Units 07/12/2017 06/29/2017 06/08/2017  WBC 3.9 - 10.3 K/uL 5.1 4.9 5.2  Hemoglobin 11.6 - 15.9 g/dL 11.2(L) 11.8 11.0(L)  Hematocrit 34.8 - 46.6 % 34.0(L) 36.1 34.6(L)  Platelets 145 - 400 K/uL 126(L) 120(L) 128(L)    . CMP Latest Ref Rng & Units 07/12/2017 06/29/2017 06/08/2017  Glucose 70 - 140 mg/dL 82 132 96  BUN 7 - 26 mg/dL 15 20 16   Creatinine 0.60 - 1.10 mg/dL 1.03 1.08 1.05  Sodium 136 - 145 mmol/L 142 142 140  Potassium 3.5 - 5.1 mmol/L 3.8 3.7 4.1  Chloride 98 - 109 mmol/L 108 105 107  CO2 22 - 29 mmol/L 26 27 27   Calcium 8.4 - 10.4 mg/dL 8.8 8.9 9.0  Total Protein 6.4 - 8.3 g/dL 6.0(L) 6.4 6.2(L)  Total Bilirubin 0.2 - 1.2 mg/dL 0.5 0.5 0.7  Alkaline Phos 40 - 150 U/L 120 116 122  AST 5 - 34 U/L 19 24 24   ALT 0 - 55 U/L 7 12 14    Component     Latest Ref Rng & Units 05/02/2017 06/29/2017  HBV DNA SERPL PCR-ACNC     IU/mL 290 HBV DNA not detected  HBV DNA SERPL PCR-LOG IU     log10 IU/mL 2.462 UNABLE TO CALCULATE  Test Info:  Comment Comment   Component     Latest Ref Rng & Units 07/12/2017  HBV DNA SERPL PCR-ACNC     IU/mL HBV DNA not detected  HBV DNA SERPL PCR-LOG IU     log10 IU/mL UNABLE TO CALCULATE  Test Info:      Comment   Component     Latest Ref Rng & Units 05/02/2017  HBV DNA SERPL PCR-ACNC     IU/mL 290  HBV  DNA SERPL PCR-LOG IU     log10 IU/mL 2.462  Test Info:      Comment  Hep B Core Ab, IgM     Negative Negative  Hep B Core Ab, Tot     Negative Positive (A)  Hepatitis B Surface Ag     Negative Confirm. indicated  HCV Ab     0.0 - 0.9 s/co ratio <0.1  HBsAg Conf      Positive (A)    PATHOLOGY    Diagnosis 04/25/17  Lymph node, needle/core biopsy, left - FOLLICULAR LYMPHOMA, GRADE 1-2. - SEE ONCOLOGY TABLE. Microscopic Comment LYMPHOMA Histologic type: Non-Hodgkin B-cell lymphoma: Follicular lymphoma. Grade (if applicable): Low grade (grade 1-2). Flow cytometry: N/A. Immunohistochemical stains: CD20, CD3, CD5, CD10, CD23, CD43, bcl-2, bcl-6, CD138, light chains, Ki-67. Touch preps/imprints: N/A. Comments: Core biopsies reveal effacement by a vaguely nodular infiltrate of atypical lymphocytes. The lymphocytes are predominately small with irregular nuclear contours. There are no diffuse areas of large cells. Immunohistochemistry reveals abundant CD20-positive B-cells. CD3, CD43, and CD5 highlight interfollicular T-cells. Bcl-6 and CD23 reveal numerous small follicles with bcl-6 positive cells extending outside the follicular dendritic networks. The majority of these cells are bcl-2 positive. CD10 reveals only a few small residual germinal center foci. CD138 highlights scattered plasma cells which are polytypic by light chain in situ hybridization. The overall findings are most consistent with a low grade follicular lymphoma. Dr. Isaiah Blakes was notified on 04/27/2017.   RADIOGRAPHIC STUDIES: I have personally reviewed the radiological images as listed and agreed with the findings in the report. Xr Humerus Right  Result Date: 07/04/2017 Stable alignment of midshaft fracture with evidence of callus formation  US Abdomen Limited Ruq  Result Date: 06/15/2017 CLINICAL DATA:  Hepatitis B. [place] screening.  History of lymphoma. EXAM: ULTRASOUND ABDOMEN LIMITED RIGHT UPPER QUADRANT  COMPARISON:  CT scratched it PET-CT 05/05/2017. FINDINGS: Gallbladder: Gallbladder contracted. Some shadowing appears to be present suggesting gallstones. No gallbladder wall thickening. Negative Murphy sign. Common bile duct: Diameter: 4.3 mm Liver: Multiple hepatic cysts some with thin septations. The largest measures 2.8 cm. These are most likely benign. Similar findings noted on prior PET-CT. No solid lesion identified. Within normal limits in parenchymal echogenicity. Portal vein is patent on color Doppler imaging with normal direction of blood flow towards the liver. IMPRESSION: 1. Contracted gallbladder. Probable gallstones. No biliary distention. 2. Multiple cysts in the liver consistent with benign cysts. No solid hepatic abnormality identified. Electronically Signed   By: Marcello Moores  Register   On: 06/15/2017 12:37    Screening Mammogram 04/17/17 IMPRESSION:  The New enlarged nodes in both axillae are indeterminate. An [name] is recommended.    ASSESSMENT & PLAN:   Brooke Huber is a 72 y.o. Caucasian female with   1. 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  2. Mild thrombocytopenia-- likely from lymphoma. ? BM involvement vs Immune thrombocytopenia. PLT 126k   3. Chronic active Hepatitis B -Hepatitis B labs from 05/02/17 revealed chronic active disease which will be a limiting factor in her treatment.  -Pt has been started on Tenofovir AF with ID, Dr Linus Salmons on 05/24/17 -Tenofovir has successfully suppressed her Hep B virus as of 06/29/17 and 07/12/2017 labs  PLAN  -Discussed pt labwork today, 07/12/17; blood counts and chemistries are stable. 06/29/17 Hep B DNA, PCR was  not detected. LDH is normal. -Offered port placement, pt declined at this time but will let me know if she needs to have this placed -Pt will take Allopurinol during C1 for  tumor lysis prohylaxis -Zofran and Ativan for nausea as needed -Will not order Compazine since pt already takes Resperidone -Will see pt back in 10 days for toxicity check with labs  -Will repeat PET after 3 cycles  -Pt to begin C1 today with undetectable Hep B from 06/29/17 labs   RTC with Dr Irene Limbo in 10 days for a toxicity check with labs Please schedule C2 of BR as per orders RTC with Dr Irene Limbo in 4 weeks with C2D1 of BR with labs    All of the patients questions were answered with apparent satisfaction. The patient knows to call the clinic with any problems, questions or concerns.  The toal time spent in the appt was 25 minutes and more than 50% was on counseling and direct patient cares.     Sullivan Lone MD MS AAHIVMS [facility] Hematology/Oncology Physician [facility]  (Office):       [phone] (Work cell):  [phone] (Fax):           [phone]  07/12/2017 9:31 AM  I, Baldwin [place], am acting as a Education administrator for Dr Irene Limbo.   .I have reviewed the above documentation for accuracy and completeness, and I agree with the above. Brunetta Genera MD

## 2017-07-11 NOTE — Telephone Encounter (Signed)
Patient called stating she got a call from the [employer] stating they needed a form faxed to them from the physician by June 23rd so they can continue her Vemlidy.  Brooke Huber made aware. Pricilla Riffle RN

## 2017-07-12 ENCOUNTER — Telehealth: Payer: Self-pay

## 2017-07-12 ENCOUNTER — Inpatient Hospital Stay: Payer: PPO | Attending: Hematology | Admitting: Hematology

## 2017-07-12 ENCOUNTER — Other Ambulatory Visit: Payer: Self-pay

## 2017-07-12 ENCOUNTER — Inpatient Hospital Stay: Payer: PPO

## 2017-07-12 ENCOUNTER — Inpatient Hospital Stay: Payer: PPO | Admitting: Medical

## 2017-07-12 VITALS — BP 113/53 | HR 81 | Temp 99.3°F | Resp 16

## 2017-07-12 VITALS — BP 113/60 | HR 68 | Temp 98.4°F | Resp 18 | Ht 63.0 in | Wt 183.7 lb

## 2017-07-12 DIAGNOSIS — Z5112 Encounter for antineoplastic immunotherapy: Secondary | ICD-10-CM | POA: Diagnosis not present

## 2017-07-12 DIAGNOSIS — K732 Chronic active hepatitis, not elsewhere classified: Secondary | ICD-10-CM | POA: Insufficient documentation

## 2017-07-12 DIAGNOSIS — B181 Chronic viral hepatitis B without delta-agent: Secondary | ICD-10-CM

## 2017-07-12 DIAGNOSIS — C8218 Follicular lymphoma grade II, lymph nodes of multiple sites: Secondary | ICD-10-CM

## 2017-07-12 DIAGNOSIS — B1910 Unspecified viral hepatitis B without hepatic coma: Secondary | ICD-10-CM | POA: Insufficient documentation

## 2017-07-12 DIAGNOSIS — D696 Thrombocytopenia, unspecified: Secondary | ICD-10-CM | POA: Diagnosis not present

## 2017-07-12 DIAGNOSIS — T8090XA Unspecified complication following infusion and therapeutic injection, initial encounter: Secondary | ICD-10-CM

## 2017-07-12 DIAGNOSIS — Z5111 Encounter for antineoplastic chemotherapy: Secondary | ICD-10-CM | POA: Diagnosis not present

## 2017-07-12 LAB — CMP (CANCER CENTER ONLY)
ALK PHOS: 120 U/L (ref 40–150)
ALT: 7 U/L (ref 0–55)
ANION GAP: 8 (ref 3–11)
AST: 19 U/L (ref 5–34)
Albumin: 3.4 g/dL — ABNORMAL LOW (ref 3.5–5.0)
BILIRUBIN TOTAL: 0.5 mg/dL (ref 0.2–1.2)
BUN: 15 mg/dL (ref 7–26)
CALCIUM: 8.8 mg/dL (ref 8.4–10.4)
CO2: 26 mmol/L (ref 22–29)
Chloride: 108 mmol/L (ref 98–109)
Creatinine: 1.03 mg/dL (ref 0.60–1.10)
GFR, EST NON AFRICAN AMERICAN: 53 mL/min — AB (ref >60)
GFR, Est AFR Am: >60 mL/min (ref >60)
Glucose, Bld: 82 mg/dL (ref 70–140)
POTASSIUM: 3.8 mmol/L (ref 3.5–5.1)
Sodium: 142 mmol/L (ref 136–145)
TOTAL PROTEIN: 6 g/dL — AB (ref 6.4–8.3)

## 2017-07-12 LAB — CBC WITH DIFFERENTIAL (CANCER CENTER ONLY)
BASOS ABS: 0.1 10*3/uL (ref 0.0–0.1)
BASOS PCT: 1 %
EOS PCT: 6 %
Eosinophils Absolute: 0.3 10*3/uL (ref 0.0–0.5)
HEMATOCRIT: 34 % — AB (ref 34.8–46.6)
Hemoglobin: 11.2 g/dL — ABNORMAL LOW (ref 11.6–15.9)
LYMPHS PCT: 28 %
Lymphs Abs: 1.4 10*3/uL (ref 0.9–3.3)
MCH: 28.8 pg (ref 25.1–34.0)
MCHC: 33.1 g/dL (ref 31.5–36.0)
MCV: 87.2 fL (ref 79.5–101.0)
Monocytes Absolute: 0.5 10*3/uL (ref 0.1–0.9)
Monocytes Relative: 11 %
NEUTROS ABS: 2.8 10*3/uL (ref 1.5–6.5)
Neutrophils Relative %: 54 %
Platelet Count: 126 10*3/uL — ABNORMAL LOW (ref 145–400)
RBC: 3.9 MIL/uL (ref 3.70–5.45)
RDW: 14.4 % (ref 11.2–14.5)
WBC: 5.1 10*3/uL (ref 3.9–10.3)

## 2017-07-12 LAB — LACTATE DEHYDROGENASE: LDH: 162 U/L (ref 125–245)

## 2017-07-12 MED ORDER — RITUXIMAB CHEMO INJECTION 500 MG/50ML
375.0000 mg/m2 | Freq: Once | INTRAVENOUS | Status: AC
  Administered 2017-07-12: 700 mg via INTRAVENOUS
  Filled 2017-07-12: qty 70

## 2017-07-12 MED ORDER — DIPHENHYDRAMINE HCL 25 MG PO CAPS
ORAL_CAPSULE | ORAL | Status: AC
  Filled 2017-07-12: qty 2

## 2017-07-12 MED ORDER — DEXAMETHASONE SODIUM PHOSPHATE 10 MG/ML IJ SOLN
INTRAMUSCULAR | Status: AC
  Filled 2017-07-12: qty 1

## 2017-07-12 MED ORDER — DIPHENHYDRAMINE HCL 25 MG PO CAPS
50.0000 mg | ORAL_CAPSULE | Freq: Once | ORAL | Status: AC
  Administered 2017-07-12: 50 mg via ORAL

## 2017-07-12 MED ORDER — FAMOTIDINE 20 MG PO TABS
40.0000 mg | ORAL_TABLET | Freq: Once | ORAL | Status: AC
  Administered 2017-07-12: 40 mg via ORAL

## 2017-07-12 MED ORDER — BENDAMUSTINE HCL CHEMO INJECTION 100 MG/4ML
90.0000 mg/m2 | Freq: Once | INTRAVENOUS | Status: AC
  Administered 2017-07-12: 175 mg via INTRAVENOUS
  Filled 2017-07-12: qty 7

## 2017-07-12 MED ORDER — SODIUM CHLORIDE 0.9 % IV SOLN
Freq: Once | INTRAVENOUS | Status: AC
  Administered 2017-07-12: 10:00:00 via INTRAVENOUS

## 2017-07-12 MED ORDER — PALONOSETRON HCL INJECTION 0.25 MG/5ML
INTRAVENOUS | Status: AC
  Filled 2017-07-12: qty 5

## 2017-07-12 MED ORDER — ACETAMINOPHEN 325 MG PO TABS
650.0000 mg | ORAL_TABLET | Freq: Once | ORAL | Status: AC
  Administered 2017-07-12: 650 mg via ORAL

## 2017-07-12 MED ORDER — PALONOSETRON HCL INJECTION 0.25 MG/5ML
0.2500 mg | Freq: Once | INTRAVENOUS | Status: AC
  Administered 2017-07-12: 0.25 mg via INTRAVENOUS

## 2017-07-12 MED ORDER — DEXAMETHASONE SODIUM PHOSPHATE 10 MG/ML IJ SOLN
10.0000 mg | Freq: Once | INTRAMUSCULAR | Status: AC
  Administered 2017-07-12: 10 mg via INTRAVENOUS

## 2017-07-12 MED ORDER — FAMOTIDINE 20 MG PO TABS
ORAL_TABLET | ORAL | Status: AC
  Filled 2017-07-12: qty 2

## 2017-07-12 MED ORDER — ACETAMINOPHEN 325 MG PO TABS
ORAL_TABLET | ORAL | Status: AC
  Filled 2017-07-12: qty 2

## 2017-07-12 NOTE — Telephone Encounter (Signed)
PRINTED AVS AND CALENDER OF UPCOMING APPOINTMENT. PER 6/12 LOS

## 2017-07-12 NOTE — Patient Instructions (Signed)
[facility] Discharge Instructions for Patients Receiving Chemotherapy  Today you received the following chemotherapy agents:  Rituxan and Bendeka.  To help prevent nausea and vomiting after your treatment, we encourage you to take your nausea medication as directed.   If you develop nausea and vomiting that is not controlled by your nausea medication, call the clinic.   BELOW ARE SYMPTOMS THAT SHOULD BE REPORTED IMMEDIATELY:  *FEVER GREATER THAN 100.5 F  *CHILLS WITH OR WITHOUT FEVER  NAUSEA AND VOMITING THAT IS NOT CONTROLLED WITH YOUR NAUSEA MEDICATION  *UNUSUAL SHORTNESS OF BREATH  *UNUSUAL BRUISING OR BLEEDING  TENDERNESS IN MOUTH AND THROAT WITH OR WITHOUT PRESENCE OF ULCERS  *URINARY PROBLEMS  *BOWEL PROBLEMS  UNUSUAL RASH Items with * indicate a potential emergency and should be followed up as soon as possible.  Feel free to call the clinic should you have any questions or concerns. The clinic phone number is [phone].  Please show the CHEMO ALERT CARD at check-in to the Emergency Department and triage nurse.  Rituximab injection What is this medicine? RITUXIMAB (ri TUX i mab) is a monoclonal antibody. It is used to treat certain types of cancer like non-Hodgkin lymphoma and chronic lymphocytic leukemia. It is also used to treat rheumatoid arthritis, granulomatosis with polyangiitis (or Wegener's granulomatosis), and microscopic polyangiitis. This medicine may be used for other purposes; ask your health care provider or pharmacist if you have questions. COMMON BRAND NAME(S): Rituxan What should I tell my health care provider before I take this medicine? They need to know if you have any of these conditions: -heart disease -infection (especially a virus infection such as hepatitis B, chickenpox, cold sores, or herpes) -immune system problems -irregular heartbeat -kidney disease -lung or breathing disease, like asthma -recently received or  scheduled to receive a vaccine -an unusual or allergic reaction to rituximab, mouse proteins, other medicines, foods, dyes, or preservatives -pregnant or trying to get pregnant -breast-feeding How should I use this medicine? This medicine is for infusion into a vein. It is administered in a hospital or clinic by a specially trained health care professional. A special MedGuide will be given to you by the pharmacist with each prescription and refill. Be sure to read this information carefully each time. Talk to your pediatrician regarding the use of this medicine in children. This medicine is not approved for use in children. Overdosage: If you think you have taken too much of this medicine contact a poison control center or emergency room at once. NOTE: This medicine is only for you. Do not share this medicine with others. What if I miss a dose? It is important not to miss a dose. Call your doctor or health care professional if you are unable to keep an appointment. What may interact with this medicine? -cisplatin -other medicines for arthritis like disease modifying antirheumatic drugs or tumor necrosis factor inhibitors -live virus vaccines This list may not describe all possible interactions. Give your health care provider a list of all the medicines, herbs, non-prescription drugs, or dietary supplements you use. Also tell them if you smoke, drink alcohol, or use illegal drugs. Some items may interact with your medicine. What should I watch for while using this medicine? Your condition will be monitored carefully while you are receiving this medicine. You may need blood work done while you are taking this medicine. This medicine can cause serious allergic reactions. To reduce your risk you may need to take medicine before treatment with this medicine.   Take your medicine as directed. In some patients, this medicine may cause a serious brain infection that may cause death. If you have any  problems seeing, thinking, speaking, walking, or standing, tell your doctor right away. If you cannot reach your doctor, urgently seek other source of medical care. Call your doctor or health care professional for advice if you get a fever, chills or sore throat, or other symptoms of a cold or flu. Do not treat yourself. This drug decreases your body's ability to fight infections. Try to avoid being around people who are sick. Do not become pregnant while taking this medicine or for 12 months after stopping it. Women should inform their doctor if they wish to become pregnant or think they might be pregnant. There is a potential for serious side effects to an unborn child. Talk to your health care professional or pharmacist for more information. What side effects may I notice from receiving this medicine? Side effects that you should report to your doctor or health care professional as soon as possible: -breathing problems -chest pain -dizziness or feeling faint -fast, irregular heartbeat -low blood counts - this medicine may decrease the number of white blood cells, red blood cells and platelets. You may be at increased risk for infections and bleeding. -mouth sores -redness, blistering, peeling or loosening of the skin, including inside the mouth (this can be added for any serious or exfoliative rash that could lead to hospitalization) -signs of infection - fever or chills, cough, sore throat, pain or difficulty passing urine -signs and symptoms of kidney injury like trouble passing urine or change in the amount of urine -signs and symptoms of liver injury like dark yellow or brown urine; general ill feeling or flu-like symptoms; light-colored stools; loss of appetite; nausea; right upper belly pain; unusually weak or tired; yellowing of the eyes or skin -stomach pain -vomiting Side effects that usually do not require medical attention (report to your doctor or health care professional if they  continue or are bothersome): -headache -joint pain -muscle cramps or muscle pain This list may not describe all possible side effects. Call your doctor for medical advice about side effects. You may report side effects to FDA at 1-800-FDA-1088. Where should I keep my medicine? This drug is given in a hospital or clinic and will not be stored at home. NOTE: This sheet is a summary. It may not cover all possible information. If you have questions about this medicine, talk to your doctor, pharmacist, or health care provider.  2018 Elsevier/Gold Standard (2015-08-26 15:28:09)  Bendamustine Injection What is this medicine? BENDAMUSTINE (BEN da MUS teen) is a chemotherapy drug. It is used to treat chronic lymphocytic leukemia and non-Hodgkin lymphoma. This medicine may be used for other purposes; ask your health care provider or pharmacist if you have questions. COMMON BRAND NAME(S): BENDEKA, Treanda What should I tell my health care provider before I take this medicine? They need to know if you have any of these conditions: -infection (especially a virus infection such as chickenpox, cold sores, or herpes) -kidney disease -liver disease -an unusual or allergic reaction to bendamustine, mannitol, other medicines, foods, dyes, or preservatives -pregnant or trying to get pregnant -breast-feeding How should I use this medicine? This medicine is for infusion into a vein. It is given by a health care professional in a hospital or clinic setting. Talk to your pediatrician regarding the use of this medicine in children. Special care may be needed. Overdosage: If you think you   have taken too much of this medicine contact a poison control center or emergency room at once. NOTE: This medicine is only for you. Do not share this medicine with others. What if I miss a dose? It is important not to miss your dose. Call your doctor or health care professional if you are unable to keep an appointment. What  may interact with this medicine? Do not take this medicine with any of the following medications: -clozapine This medicine may also interact with the following medications: -atazanavir -cimetidine -ciprofloxacin -enoxacin -fluvoxamine -medicines for seizures like carbamazepine and phenobarbital -mexiletine -rifampin -tacrine -thiabendazole -zileuton This list may not describe all possible interactions. Give your health care provider a list of all the medicines, herbs, non-prescription drugs, or dietary supplements you use. Also tell them if you smoke, drink alcohol, or use illegal drugs. Some items may interact with your medicine. What should I watch for while using this medicine? This drug may make you feel generally unwell. This is not uncommon, as chemotherapy can affect healthy cells as well as cancer cells. Report any side effects. Continue your course of treatment even though you feel ill unless your doctor tells you to stop. You may need blood work done while you are taking this medicine. Call your doctor or health care professional for advice if you get a fever, chills or sore throat, or other symptoms of a cold or flu. Do not treat yourself. This drug decreases your body's ability to fight infections. Try to avoid being around people who are sick. This medicine may increase your risk to bruise or bleed. Call your doctor or health care professional if you notice any unusual bleeding. Talk to your doctor about your risk of cancer. You may be more at risk for certain types of cancers if you take this medicine. Do not become pregnant while taking this medicine or for 3 months after stopping it. Women should inform their doctor if they wish to become pregnant or think they might be pregnant. Men should not father a child while taking this medicine and for 3 months after stopping it.There is a potential for serious side effects to an unborn child. Talk to your health care professional or  pharmacist for more information. Do not breast-feed an infant while taking this medicine. This medicine may interfere with the ability to have a child. You should talk with your doctor or health care professional if you are concerned about your fertility. What side effects may I notice from receiving this medicine? Side effects that you should report to your doctor or health care professional as soon as possible: -allergic reactions like skin rash, itching or hives, swelling of the face, lips, or tongue -low blood counts - this medicine may decrease the number of white blood cells, red blood cells and platelets. You may be at increased risk for infections and bleeding. -redness, blistering, peeling or loosening of the skin, including inside the mouth -signs of infection - fever or chills, cough, sore throat, pain or difficulty passing urine -signs of decreased platelets or bleeding - bruising, pinpoint red spots on the skin, black, tarry stools, blood in the urine -signs of decreased red blood cells - unusually weak or tired, fainting spells, lightheadedness -signs and symptoms of kidney injury like trouble passing urine or change in the amount of urine -signs and symptoms of liver injury like dark yellow or brown urine; general ill feeling or flu-like symptoms; light-colored stools; loss of appetite; nausea; right upper belly pain; unusually   weak or tired; yellowing of the eyes or skin Side effects that usually do not require medical attention (report to your doctor or health care professional if they continue or are bothersome): -constipation -decreased appetite -diarrhea -headache -mouth sores -nausea/vomiting -tiredness This list may not describe all possible side effects. Call your doctor for medical advice about side effects. You may report side effects to FDA at 1-800-FDA-1088. Where should I keep my medicine? This drug is given in a hospital or clinic and will not be stored at  home. NOTE: This sheet is a summary. It may not cover all possible information. If you have questions about this medicine, talk to your doctor, pharmacist, or health care provider.  2018 Elsevier/Gold Standard (2014-11-20 08:45:41)   

## 2017-07-12 NOTE — Progress Notes (Signed)
1125- Patient with complaints of chest tightness and feeling "woozy". Sandi Mealy, PA notified. Per Tanner, PA Decadron 10mg  IV given at 1132. Normal Saline infusing.  1135- Vital signs taken. BP 123/56, O2 sat 99%, pulse 67, resp 20, temp 98.2. 1150- Per V. Tanner, PA Rituxan restarted.

## 2017-07-13 ENCOUNTER — Inpatient Hospital Stay: Payer: PPO

## 2017-07-13 ENCOUNTER — Ambulatory Visit: Payer: Self-pay

## 2017-07-13 VITALS — BP 103/42 | HR 52 | Temp 98.2°F | Resp 16

## 2017-07-13 DIAGNOSIS — C8218 Follicular lymphoma grade II, lymph nodes of multiple sites: Secondary | ICD-10-CM

## 2017-07-13 DIAGNOSIS — Z5112 Encounter for antineoplastic immunotherapy: Secondary | ICD-10-CM | POA: Diagnosis not present

## 2017-07-13 MED ORDER — FAMOTIDINE 20 MG PO TABS
40.0000 mg | ORAL_TABLET | Freq: Once | ORAL | Status: AC
Start: 2017-07-13 — End: 2017-07-13
  Administered 2017-07-13: 40 mg via ORAL

## 2017-07-13 MED ORDER — DEXAMETHASONE SODIUM PHOSPHATE 10 MG/ML IJ SOLN
INTRAMUSCULAR | Status: AC
  Filled 2017-07-13: qty 1

## 2017-07-13 MED ORDER — DEXAMETHASONE SODIUM PHOSPHATE 10 MG/ML IJ SOLN
10.0000 mg | Freq: Once | INTRAMUSCULAR | Status: AC
  Administered 2017-07-13: 10 mg via INTRAVENOUS

## 2017-07-13 MED ORDER — SODIUM CHLORIDE 0.9 % IV SOLN
Freq: Once | INTRAVENOUS | Status: AC
  Administered 2017-07-13: 15:00:00 via INTRAVENOUS

## 2017-07-13 MED ORDER — BENDAMUSTINE HCL CHEMO INJECTION 100 MG/4ML
90.0000 mg/m2 | Freq: Once | INTRAVENOUS | Status: AC
  Administered 2017-07-13: 175 mg via INTRAVENOUS
  Filled 2017-07-13: qty 7

## 2017-07-13 MED ORDER — FAMOTIDINE 20 MG PO TABS
ORAL_TABLET | ORAL | Status: AC
  Filled 2017-07-13: qty 2

## 2017-07-13 NOTE — Progress Notes (Signed)
   DATE: 07/12/2017      X CHEMO/IMMUNOTHERAPY REACTION           MD: Irene Limbo     AGENT/BLOOD PRODUCT RECEIVING TODAY:   Rituxan and Bendeka  AGENT/BLOOD PRODUCT RECEIVING IMMEDIATELY PRIOR TO REACTION: Rituxan  VS: BP: 123/56 p:       67 SPO2:       99% on room air       T:       98.2  REACTION(S):  Chest heaviness and feelings of wooziness  PREMEDS: Tylenol 650 mg p.o. x1, Benadryl 50 mg p.o. x1, Pepcid 40 mg p.o. x1  INTERVENTION:  Decadron 10 mg IV x1  Review of Systems  Constitutional: Negative for diaphoresis.  HENT: Negative for trouble swallowing.   Respiratory: Negative for cough, choking, chest tightness, shortness of breath and wheezing.   Cardiovascular: Negative for chest pain and palpitations.       Chest heaviness  Gastrointestinal: Negative for nausea and vomiting.  Musculoskeletal: Negative for back pain.  Skin: Negative for color change and rash.  Neurological: Negative for dizziness, light-headedness and headaches.       Wooziness    Physical Exam  Constitutional: No distress.  HENT:  Head: Normocephalic and atraumatic.  Cardiovascular: Normal rate, regular rhythm and normal heart sounds. Exam reveals no gallop and no friction rub.  No murmur heard. Pulmonary/Chest: Effort normal and breath sounds normal. No respiratory distress. She has no wheezes. She has no rales.  Neurological: She is alert.  Skin: Skin is warm and dry. No rash noted. She is not diaphoretic. No erythema.     OUTCOME: Patient responded to intervention. Chemo/Immunotherapy restarted and completed.  Sandi Mealy, MHS, PA-C

## 2017-07-13 NOTE — Patient Instructions (Signed)
[facility] Discharge Instructions for Patients Receiving Chemotherapy  Today you received the following chemotherapy agents Bendeka.  To help prevent nausea and vomiting after your treatment, we encourage you to take your nausea medication as directed.   If you develop nausea and vomiting that is not controlled by your nausea medication, call the clinic.   BELOW ARE SYMPTOMS THAT SHOULD BE REPORTED IMMEDIATELY:  *FEVER GREATER THAN 100.5 F  *CHILLS WITH OR WITHOUT FEVER  NAUSEA AND VOMITING THAT IS NOT CONTROLLED WITH YOUR NAUSEA MEDICATION  *UNUSUAL SHORTNESS OF BREATH  *UNUSUAL BRUISING OR BLEEDING  TENDERNESS IN MOUTH AND THROAT WITH OR WITHOUT PRESENCE OF ULCERS  *URINARY PROBLEMS  *BOWEL PROBLEMS  UNUSUAL RASH Items with * indicate a potential emergency and should be followed up as soon as possible.  Feel free to call the clinic should you have any questions or concerns. The clinic phone number is [phone].  Please show the CHEMO ALERT CARD at check-in to the Emergency Department and triage nurse.   

## 2017-07-14 ENCOUNTER — Telehealth: Payer: Self-pay | Admitting: *Deleted

## 2017-07-14 LAB — HEPATITIS B DNA, ULTRAQUANTITATIVE, PCR
HBV DNA SERPL PCR-ACNC: NOT DETECTED IU/mL
HBV DNA SERPL PCR-LOG IU: UNDETERMINED {Log_IU}/mL

## 2017-07-14 NOTE — Telephone Encounter (Signed)
"  Calling to go over my medications and let them know what medications I need.  Do not transfer my call.  When nurse says hello, the call gets lost.   Need medications ordered per insurance.  Not able to obtain 'over the counter'."   Upon review patient reveals need for the following refills. "Ascorbic Acid 500 mg, take one daily.   Tenofover Alafenamide Fumarate 25 mg, take one tablet daily Tenofobvir Disoproxil Fumarate 300 mg, take one daily.  Answered questions about when and how to take Dexamethasone, Lorazepam and Ondansetron.  Despite Compazine on current medication list, reports "provider marked through Prochlorperazine and I am not to use this medicine for nausea or Vomirting.  Thought she was to wait Until Monday, three days after treatment to begin Dexamethasone.  Understands to take the two Dexamethasone pills (8 MG total) at the same time today, tomorrow in same manner with every treatment."       This nurse reviewed EPIC medication list while patient compared to her med list and bottles.

## 2017-07-17 ENCOUNTER — Encounter: Payer: Self-pay | Admitting: Internal Medicine

## 2017-07-17 ENCOUNTER — Ambulatory Visit (INDEPENDENT_AMBULATORY_CARE_PROVIDER_SITE_OTHER): Payer: PPO | Admitting: Internal Medicine

## 2017-07-17 DIAGNOSIS — Z5181 Encounter for therapeutic drug level monitoring: Secondary | ICD-10-CM | POA: Diagnosis not present

## 2017-07-17 DIAGNOSIS — B181 Chronic viral hepatitis B without delta-agent: Secondary | ICD-10-CM

## 2017-07-17 NOTE — Progress Notes (Signed)
   Subjective:    Patient ID: Brooke Huber, female    DOB: 1945-04-10, 72 y.o.   MRN: [id number]  HPI Here for follow up of chronic hepatitis B. She was recently diagnosed with follicular lymphoma and noted to have a positive hepatitis B surface Ag.  Her intial DNA level was modest at just 290 but due to concern for exacerbation, I started her on V[place]y.  She takes that daily and follow up DNA levels show a suppressed DNA.  No issues with the medicaiton including no associated n/v/rash.    Review of Systems  Constitutional: Negative for fatigue.  Gastrointestinal: Negative for diarrhea.  Skin: Negative for rash.       Objective:   Physical Exam  Constitutional: She appears well-developed and well-nourished. No distress.  HENT:  Mouth/Throat: No oropharyngeal exudate.  Eyes: No scleral icterus.  Cardiovascular: Normal rate, regular rhythm and normal heart sounds.  No murmur heard. Pulmonary/Chest: Effort normal and breath sounds normal. No respiratory distress.  Skin: No rash noted.          Assessment & Plan:

## 2017-07-17 NOTE — Assessment & Plan Note (Signed)
Doing well on the medication.  Continue during chemo and likely 3-6 months following.   rtc 3 months.

## 2017-07-17 NOTE — Assessment & Plan Note (Signed)
Recent creat wnl.  Getting monitored with chemo.

## 2017-07-18 ENCOUNTER — Encounter (INDEPENDENT_AMBULATORY_CARE_PROVIDER_SITE_OTHER): Payer: Self-pay | Admitting: Orthopaedic Surgery

## 2017-07-18 ENCOUNTER — Ambulatory Visit (INDEPENDENT_AMBULATORY_CARE_PROVIDER_SITE_OTHER): Payer: PPO | Admitting: Orthopaedic Surgery

## 2017-07-18 ENCOUNTER — Ambulatory Visit (INDEPENDENT_AMBULATORY_CARE_PROVIDER_SITE_OTHER): Payer: PPO

## 2017-07-18 DIAGNOSIS — S42331A Displaced oblique fracture of shaft of humerus, right arm, initial encounter for closed fracture: Secondary | ICD-10-CM

## 2017-07-18 NOTE — Progress Notes (Signed)
   Post-Op Visit Note   Patient: Brooke Huber           Date of Birth: 1945-07-15           MRN: [id number] Visit Date: 07/18/2017 PCP: Alroy Dust, L.Marlou Sa, MD   Assessment & Plan:  Chief Complaint:  Chief Complaint  Patient presents with  . Right Upper Arm - Pain    HUMERUS   Visit Diagnoses:  1. Closed displaced oblique fracture of shaft of right humerus, initial encounter     Plan: Patient is 8 weeks status post mildly angulated midshaft humerus fracture.  She comes in today for follow-up.  She states that she has no pain.  She is doing home exercise.  Physical exam is essentially benign.  X-rays demonstrate stable alignment and callus formation.  At this point she can discontinue her Sarmiento brace.  Continue with home exercises for strengthening range of motion.  Recheck in 6 weeks with 2 view x-rays of the right humerus.  Follow-Up Instructions: Return in about 6 weeks (around 08/29/2017).   Orders:  Orders Placed This Encounter  Procedures  . XR Humerus Right   No orders of the defined types were placed in this encounter.   Imaging: Xr Humerus Right  Result Date: 07/18/2017 Stable alignment of fracture with abundant callus formation.   PMFS History: Patient Active Problem List   Diagnosis Date Noted  . Medication monitoring encounter 07/17/2017  . 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 Closed displaced oblique fracture of shaft of right humerus 05/26/2017  . Chronic viral hepatitis B without delta-agent ([place]) 05/23/2017  . MDD (major depressive disorder), recurrent, severe, with psychosis ([place]) 06/09/2014  . PTSD (post-traumatic stress disorder) 06/09/2014  . Panic disorder 06/09/2014  . Bereavement 06/09/2014  . Hypokalemia 06/09/2014  . History of periportal fibrosis 08/31/2006   Past Medical History:  Diagnosis Date  . 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 Cancer [facility])    breast lymphoma with mets    . Depression   . GERD (gastroesophageal reflux disease)   . Hepatitis   . Hepatitis B 02/1995  . 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 Alzheimer's disease Sister   . Dementia Sister   . Sexual abuse Sister   . Seizures Sister   . Alcohol abuse Brother   . Alcohol abuse Maternal Uncle   . Drug abuse Cousin   . Drug abuse Son   . Depression Neg Hx     Past Surgical History:  Procedure Laterality Date  . APPENDECTOMY    . COLONOSCOPY WITH PROPOFOL N/A 12/29/2015   Procedure: COLONOSCOPY WITH PROPOFOL;  Surgeon: Garlan Fair, MD;  Location: [facility] ENDOSCOPY;  Service: Endoscopy;  Laterality: N/A;  . TONSILLECTOMY AND ADENOIDECTOMY Bilateral 1954  . TUBAL LIGATION     Social History   Occupational History  . Not on file  Tobacco Use  . Smoking status: Never Smoker  . Smokeless tobacco: Never Used  Substance and Sexual Activity  . Alcohol use: No  . Drug use: No  . Sexual activity: Never    Birth control/protection: None

## 2017-07-21 ENCOUNTER — Other Ambulatory Visit: Payer: Self-pay

## 2017-07-21 DIAGNOSIS — C8218 Follicular lymphoma grade II, lymph nodes of multiple sites: Secondary | ICD-10-CM

## 2017-07-21 NOTE — Progress Notes (Signed)
HEMATOLOGY/ONCOLOGY CLINIC NOTE  Date of Service:  07/24/17  Patient Care Team: Alroy Dust, L.Marlou Sa, MD as PCP - General (Family Medicine)  CHIEF COMPLAINTS/PURPOSE OF CONSULTATION:  F/u for mx of recently diagnosed Follicular Lymphoma  HISTORY OF PRESENTING ILLNESS:    Brooke Huber is a wonderful 72 y.o. female who has been referred to [name] by her PCP, Dr. 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 She presents to the clinic today accompanied by her friend.   She had a yearly screening mammogram on 04/17/17 which found her to have an enlarged axillary lymph nodes. She had a biopsy on 04/28/17 which showed evidence of lymphoma.   Today she notes she feels fine in general with no changes over the last 6 months. She hopes to go to the beach on the 18th of this month with her friend for 5 days.   She notes years ago in the late 1990s she went to donate blood and they told her she had Hep B, but never had liver damage/change or concerning symptoms. She never had been out of the country, tattoo or blood transfusion. She denies any other major comorbidities. She sees a psychiatrist every 4 months for her depression and anxiety. She notes diagnosis like this can trigger her depression but she is working to maintain her mood during this time.   On review of symptoms, pt notes limited ROM of her left arm for the last 2 years with tenderness. She notes arthritis in her hands and possibly in her shoulder. She denies bowel issues or trouble with bowel movements or passing urine.  Interval History:  Brooke Huber returns today regarding her low grade Follicular Lymphoma and a toxicity check on C1D10.  The patient's last visit with [name] was on 07/12/17. She is accompanied today by her friend. The pt reports that she is doing well overall.   The pt reports that she is eating well, her leg swelling is resolving after her previous fall, and she has lost 8 pounds in the  last 12 days. She notes that she felt some nausea only one day of her first cycle, which was completely alleviated by 0.56m Ativan. She states that she is tolerating her first cycle well so far.   She notes that she has not had any more feels and reports feeling stable on her feet and denies any concern for using a cane or walker at this time. She also notes that her shoulder pain continues to resolve.   Lab results today (07/24/17) of CBC is as follows: all values are WNL except for WBC at 2.3k, HGB at 11.2, PLT at 108k, Lymphs abs at 200.  On review of systems, pt reports weight loss, resolving leg swelling, eating well, and denies nausea, vomiting, diarrhea, falls, mouth sores, abdominal pains, and any other symptoms.    MEDICAL HISTORY:  Past Medical History:  Diagnosis Date  . 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 Cancer ([facility]    breast lymphoma with mets  . Depression   . GERD (gastroesophageal reflux disease)   . Hepatitis   . Hepatitis B 02/1995  . Seizures ([facility]    hx of seizure in 1993 and 1994 - caused by stress per patient   -Diverticulitis  -Osteopenia  -Acute gastritis   SURGICAL HISTORY: Past Surgical History:  Procedure Laterality Date  . APPENDECTOMY    . COLONOSCOPY WITH PROPOFOL N/A 12/29/2015   Procedure: COLONOSCOPY WITH PROPOFOL;  Surgeon:  Garlan Fair, MD;  Location: [facility];  Service: Endoscopy;  Laterality: N/A;  . TONSILLECTOMY AND ADENOIDECTOMY Bilateral 1954  . TUBAL LIGATION      SOCIAL HISTORY: Social History   Socioeconomic History  . Marital status: Single    Spouse name: Not on file  . Number of children: Not on file  . Years of education: Not on file  . Highest education level: Not on file  Occupational History  . Not on file  Social Needs  . Financial resource strain: Not on file  . Food insecurity:    Worry: Not on file    Inability: Not on file  . Transportation needs:    Medical: Not on file    Non-medical: Not  on file  Tobacco Use  . Smoking status: Never Smoker  . Smokeless tobacco: Never Used  Substance and Sexual Activity  . Alcohol use: No  . Drug use: No  . Sexual activity: Never    Birth control/protection: None  Lifestyle  . Physical activity:    Days per week: Not on file    Minutes per session: Not on file  . Stress: Not on file  Relationships  . Social connections:    Talks on phone: Not on file    Gets together: Not on file    Attends religious service: Not on file    Active member of club or organization: Not on file    Attends meetings of clubs or organizations: Not on file    Relationship status: Not on file  . Intimate partner violence:    Fear of current or ex partner: Not on file    Emotionally abused: Not on file    Physically abused: Not on file    Forced sexual activity: Not on file  Other Topics Concern  . Not on file  Social History Narrative  . Not on file    FAMILY HISTORY: Family History  Problem Relation Age of Onset  . Alzheimer's disease Sister   . Dementia Sister   . Sexual abuse Sister   . Seizures Sister   . Alcohol abuse Brother   . Alcohol abuse Maternal Uncle   . Drug abuse Cousin   . Drug abuse Son   . Depression Neg Hx     ALLERGIES:  is allergic to penicillins.  MEDICATIONS:  Current Outpatient Medications  Medication Sig Dispense Refill  . acyclovir (ZOVIRAX) 400 MG tablet Take 1 tablet (400 mg total) by mouth daily. 30 tablet 3  . allopurinol (ZYLOPRIM) 300 MG tablet Take 0.5 tablets (150 mg total) by mouth daily. (Patient not taking: Reported on 07/17/2017) 30 tablet 0  . Calcium Carb-Cholecalciferol 600-100 MG-UNIT CAPS Take 1 tablet by mouth daily.    . calcium-vitamin D (OSCAL WITH D) 500-200 MG-UNIT tablet Take 1 tablet by mouth 3 (three) times daily. 90 tablet 12  . dexamethasone (DECADRON) 4 MG tablet Take 2 tablets (8 mg total) by mouth daily. Start the day after bendamustine chemotherapy for 2 days. Take with food. 30  tablet 1  . HYDROcodone-acetaminophen (NORCO/VICODIN) 5-325 MG tablet TK 1 T PO  Q 4 H PRN P  0  . LORazepam (ATIVAN) 0.5 MG tablet Take 1 tablet (0.5 mg total) by mouth every 6 (six) hours as needed (Nausea or vomiting). 30 tablet 0  . ondansetron (ZOFRAN) 8 MG tablet Take 1 tablet (8 mg total) by mouth 2 (two) times daily as needed for refractory nausea / vomiting. Start on day  2 after bendamustine chemo. 30 tablet 1  . prochlorperazine (COMPAZINE) 10 MG tablet Take 1 tablet (10 mg total) by mouth every 6 (six) hours as needed (Nausea or vomiting). 30 tablet 1  . risperiDONE (RISPERDAL) 0.5 MG tablet Take 1 tablet (0.5 mg total) by mouth at bedtime. 90 tablet 0  . sertraline (ZOLOFT) 50 MG tablet Take 1 tablet (50 mg total) by mouth daily. 90 tablet 0  . Tenofovir Alafenamide Fumarate 25 MG TABS Take 1 tablet (25 mg total) by mouth daily. 30 tablet 5  . vitamin C (ASCORBIC ACID) 500 MG tablet Take 500 mg by mouth daily.     No current facility-administered medications for this visit.     REVIEW OF SYSTEMS:   A 10+ POINT REVIEW OF SYSTEMS WAS OBTAINED including neurology, dermatology, psychiatry, cardiac, respiratory, lymph, extremities, GI, GU, Musculoskeletal, constitutional, breasts, reproductive, HEENT.  All pertinent positives are noted in the HPI.  All others are negative.   PHYSICAL EXAMINATION: ECOG PERFORMANCE STATUS: 1 - Symptomatic but completely ambulatory  Vitals:   07/24/17 0900  BP: (!) 119/51  Pulse: 60  Resp: 18  Temp: 98.6 F (37 C)  SpO2: 98%   Filed Weights   07/24/17 0900  Weight: 175 lb 8 oz (79.6 kg)   .Body mass index is 31.09 kg/m.  GENERAL:alert, in no acute distress and comfortable SKIN: no acute rashes, no significant lesions EYES: conjunctiva are pink and non-injected, sclera anicteric OROPHARYNX: MMM, no exudates, no oropharyngeal erythema or ulceration NECK: supple, no JVD LYMPH:  no palpable lymphadenopathy in the inguinal regions, (+) small  palpable LN in the upper neck and middle right neck, very small palpable lymph nodes in the right axilla LUNGS: clear to auscultation b/l with normal respiratory effort HEART: regular rate & rhythm ABDOMEN:  normoactive bowel sounds , non tender, not distended. Extremity: no pedal edema PSYCH: alert & oriented x 3 with fluent speech NEURO: no focal motor/sensory deficits    LABORATORY DATA:  I have reviewed the data as listed  . CBC Latest Ref Rng & Units 07/24/2017 07/12/2017 06/29/2017  WBC 3.9 - 10.3 K/uL 2.3(L) 5.1 4.9  Hemoglobin 11.6 - 15.9 g/dL 11.2(L) 11.2(L) 11.8  Hematocrit 34.8 - 46.6 % 35.3 34.0(L) 36.1  Platelets 145 - 400 K/uL 108(L) 126(L) 120(L)    . CMP Latest Ref Rng & Units 07/12/2017 06/29/2017 06/08/2017  Glucose 70 - 140 mg/dL 82 132 96  BUN 7 - 26 mg/dL 15 20 16   Creatinine 0.60 - 1.10 mg/dL 1.03 1.08 1.05  Sodium 136 - 145 mmol/L 142 142 140  Potassium 3.5 - 5.1 mmol/L 3.8 3.7 4.1  Chloride 98 - 109 mmol/L 108 105 107  CO2 22 - 29 mmol/L 26 27 27   Calcium 8.4 - 10.4 mg/dL 8.8 8.9 9.0  Total Protein 6.4 - 8.3 g/dL 6.0(L) 6.4 6.2(L)  Total Bilirubin 0.2 - 1.2 mg/dL 0.5 0.5 0.7  Alkaline Phos 40 - 150 U/L 120 116 122  AST 5 - 34 U/L 19 24 24   ALT 0 - 55 U/L 7 12 14    Component     Latest Ref Rng & Units 05/02/2017 06/29/2017  HBV DNA SERPL PCR-ACNC     IU/mL 290 HBV DNA not detected  HBV DNA SERPL PCR-LOG IU     log10 IU/mL 2.462 UNABLE TO CALCULATE  Test Info:      Comment Comment   Component     Latest Ref Rng & Units 07/12/2017  HBV DNA  SERPL PCR-ACNC     IU/mL HBV DNA not detected  HBV DNA SERPL PCR-LOG IU     log10 IU/mL UNABLE TO CALCULATE  Test Info:      Comment   Component     Latest Ref Rng & Units 05/02/2017  HBV DNA SERPL PCR-ACNC     IU/mL 290  HBV DNA SERPL PCR-LOG IU     log10 IU/mL 2.462  Test Info:      Comment  Hep B Core Ab, IgM     Negative Negative  Hep B Core Ab, Tot     Negative Positive (A)  Hepatitis B Surface Ag      Negative Confirm. indicated  HCV Ab     0.0 - 0.9 s/co ratio <0.1  HBsAg Conf      Positive (A)    PATHOLOGY    Diagnosis 04/25/17  Lymph node, needle/core biopsy, left - FOLLICULAR LYMPHOMA, GRADE 1-2. - SEE ONCOLOGY TABLE. Microscopic Comment LYMPHOMA Histologic type: Non-Hodgkin B-cell lymphoma: Follicular lymphoma. Grade (if applicable): Low grade (grade 1-2). Flow cytometry: N/A. Immunohistochemical stains: CD20, CD3, CD5, CD10, CD23, CD43, bcl-2, bcl-6, CD138, light chains, Ki-67. Touch preps/imprints: N/A. Comments: Core biopsies reveal effacement by a vaguely nodular infiltrate of atypical lymphocytes. The lymphocytes are predominately small with irregular nuclear contours. There are no diffuse areas of large cells. Immunohistochemistry reveals abundant CD20-positive B-cells. CD3, CD43, and CD5 highlight interfollicular T-cells. Bcl-6 and CD23 reveal numerous small follicles with bcl-6 positive cells extending outside the follicular dendritic networks. The majority of these cells are bcl-2 positive. CD10 reveals only a few small residual germinal center foci. CD138 highlights scattered plasma cells which are polytypic by light chain in situ hybridization. The overall findings are most consistent with a low grade follicular lymphoma. Dr. Isaiah Blakes was notified on 04/27/2017.   RADIOGRAPHIC STUDIES: I have personally reviewed the radiological images as listed and agreed with the findings in the report. Xr Humerus Right  Result Date: 07/18/2017 Stable alignment of fracture with abundant callus formation.  Xr Humerus Right  Result Date: 07/04/2017 Stable alignment of midshaft fracture with evidence of callus formation   Screening Mammogram 04/17/17 IMPRESSION:  The New enlarged nodes in both axillae are indeterminate. An [name] is recommended.    ASSESSMENT & PLAN:   Brooke Huber is a 72 y.o. Caucasian female with   1. 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  2. Mild thrombocytopenia-- likely from lymphoma. ? BM involvement vs Immune thrombocytopenia. PLT 126k   3. Chronic active Hepatitis B -Hepatitis B labs from 05/02/17 revealed chronic active disease which will be a limiting factor in her treatment.  -Pt has been started on Tenofovir AF with ID, Dr Linus Salmons on 05/24/17 -Tenofovir has successfully suppressed her Hep B virus as of 06/29/17 and 07/24/2017 labs  PLAN  -Discussed pt labwork today, 07/24/17; blood counts are stable overall, ANC is normal.  -The pt has no prohibitive toxicities from continuing her BR treatment at this time.  -Will proceed with C2 on 08/09/17 -Continue walking 20-30 minutes each day, staying hydrated, and continue eating well -Will see pt back in 2 weeks unless she develops and new concerns prior to this -Discussed and clarified her medications  again -Discussed that maintenance Rituxan may be contra-indicated by her Hep B  -Will repeat PET after C3 -discussed with pagtient about stopping Allopurinol. -Offered port placement, pt declined at this time but will let me know if she needs to have this placed -Zofran and Ativan for nausea as needed  RTC on 7/10 and 7/11 as per appointments scheduled labs/MD/BR chemotherapy    All of the patients questions were answered with apparent satisfaction. The patient knows to call the clinic with any problems, questions or concerns.  . The total time spent in the appointment was 25 minutes and more than 50% was on counseling and direct patient cares.       Sullivan Lone MD MS AAHIVMS [facility] Hematology/Oncology Physician [facility]  (Office):       [phone] (Work cell):  [phone] (Fax):           [phone]  07/24/2017 9:56 AM  I, Baldwin [place], am acting as a Education administrator for Dr Irene Limbo.   .I have reviewed the above documentation for accuracy and completeness, and I agree with the above. Brunetta Genera MD

## 2017-07-24 ENCOUNTER — Ambulatory Visit: Payer: Self-pay | Admitting: Hematology

## 2017-07-24 ENCOUNTER — Encounter: Payer: Self-pay | Admitting: Hematology

## 2017-07-24 ENCOUNTER — Inpatient Hospital Stay: Payer: PPO

## 2017-07-24 ENCOUNTER — Other Ambulatory Visit: Payer: Self-pay

## 2017-07-24 ENCOUNTER — Inpatient Hospital Stay (HOSPITAL_BASED_OUTPATIENT_CLINIC_OR_DEPARTMENT_OTHER): Payer: PPO | Admitting: Hematology

## 2017-07-24 VITALS — BP 119/51 | HR 60 | Temp 98.6°F | Resp 18 | Ht 63.0 in | Wt 175.5 lb

## 2017-07-24 DIAGNOSIS — K732 Chronic active hepatitis, not elsewhere classified: Secondary | ICD-10-CM | POA: Diagnosis not present

## 2017-07-24 DIAGNOSIS — Z5111 Encounter for antineoplastic chemotherapy: Secondary | ICD-10-CM

## 2017-07-24 DIAGNOSIS — B1910 Unspecified viral hepatitis B without hepatic coma: Secondary | ICD-10-CM

## 2017-07-24 DIAGNOSIS — D696 Thrombocytopenia, unspecified: Secondary | ICD-10-CM | POA: Diagnosis not present

## 2017-07-24 DIAGNOSIS — Z5112 Encounter for antineoplastic immunotherapy: Secondary | ICD-10-CM | POA: Diagnosis not present

## 2017-07-24 DIAGNOSIS — C8218 Follicular lymphoma grade II, lymph nodes of multiple sites: Secondary | ICD-10-CM

## 2017-07-24 LAB — CMP (CANCER CENTER ONLY)
ALT: 10 U/L (ref 0–55)
AST: 20 U/L (ref 5–34)
Albumin: 3.6 g/dL (ref 3.5–5.0)
Alkaline Phosphatase: 86 U/L (ref 40–150)
Anion gap: 8 (ref 3–11)
BUN: 10 mg/dL (ref 7–26)
CHLORIDE: 103 mmol/L (ref 98–109)
CO2: 30 mmol/L — ABNORMAL HIGH (ref 22–29)
Calcium: 9.1 mg/dL (ref 8.4–10.4)
Creatinine: 0.87 mg/dL (ref 0.60–1.10)
GFR, Estimated: >60 mL/min (ref >60)
GLUCOSE: 93 mg/dL (ref 70–140)
POTASSIUM: 3.4 mmol/L — AB (ref 3.5–5.1)
SODIUM: 141 mmol/L (ref 136–145)
Total Bilirubin: 0.6 mg/dL (ref 0.2–1.2)
Total Protein: 5.8 g/dL — ABNORMAL LOW (ref 6.4–8.3)

## 2017-07-24 LAB — CBC WITH DIFFERENTIAL (CANCER CENTER ONLY)
Basophils Absolute: 0 10*3/uL (ref 0.0–0.1)
Basophils Relative: 0 %
EOS ABS: 0 10*3/uL (ref 0.0–0.5)
EOS PCT: 1 %
HCT: 35.3 % (ref 34.8–46.6)
Hemoglobin: 11.2 g/dL — ABNORMAL LOW (ref 11.6–15.9)
LYMPHS ABS: 0.2 10*3/uL — AB (ref 0.9–3.3)
LYMPHS PCT: 8 %
MCH: 28.6 pg (ref 25.1–34.0)
MCHC: 31.7 g/dL (ref 31.5–36.0)
MCV: 90.1 fL (ref 79.5–101.0)
MONO ABS: 0.4 10*3/uL (ref 0.1–0.9)
MONOS PCT: 17 %
Neutro Abs: 1.6 10*3/uL (ref 1.5–6.5)
Neutrophils Relative %: 74 %
PLATELETS: 108 10*3/uL — AB (ref 145–400)
RBC: 3.92 MIL/uL (ref 3.70–5.45)
RDW: 14.3 % (ref 11.2–14.5)
WBC Count: 2.3 10*3/uL — ABNORMAL LOW (ref 3.9–10.3)

## 2017-07-24 LAB — LACTATE DEHYDROGENASE: LDH: 175 U/L (ref 125–245)

## 2017-07-25 ENCOUNTER — Telehealth: Payer: Self-pay

## 2017-07-25 LAB — HEPATITIS B DNA, ULTRAQUANTITATIVE, PCR
HBV DNA SERPL PCR-ACNC: NOT DETECTED [IU]/mL
HBV DNA SERPL PCR-LOG IU: UNDETERMINED log10 IU/mL

## 2017-07-25 NOTE — Telephone Encounter (Signed)
Per 6/24 los already scheduled

## 2017-08-07 NOTE — Progress Notes (Signed)
HEMATOLOGY/ONCOLOGY CLINIC NOTE  Date of Service:  08/09/17    Patient Care Team: Alroy Dust, L.Marlou Sa, MD as PCP - General (Family Medicine)  CHIEF COMPLAINTS/PURPOSE OF CONSULTATION:  F/u for mx of recently diagnosed Follicular Lymphoma  HISTORY OF PRESENTING ILLNESS:    Brooke Huber is a wonderful 72 y.o. female who has been referred to [name] by her PCP, Dr. 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 She presents to the clinic today accompanied by her friend.   She had a yearly screening mammogram on 04/17/17 which found her to have an enlarged axillary lymph nodes. She had a biopsy on 04/28/17 which showed evidence of lymphoma.   Today she notes she feels fine in general with no changes over the last 6 months. She hopes to go to the beach on the 18th of this month with her friend for 5 days.   She notes years ago in the late 1990s she went to donate blood and they told her she had Hep B, but never had liver damage/change or concerning symptoms. She never had been out of the country, tattoo or blood transfusion. She denies any other major comorbidities. She sees a psychiatrist every 4 months for her depression and anxiety. She notes diagnosis like this can trigger her depression but she is working to maintain her mood during this time.   On review of symptoms, pt notes limited ROM of her left arm for the last 2 years with tenderness. She notes arthritis in her hands and possibly in her shoulder. She denies bowel issues or trouble with bowel movements or passing urine.  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 The patient's last visit with [name] was on 07/24/17. She is accompanied today by her friend. The pt reports that she is doing well overall.   The pt reports that she is eating well, walking around without trouble and is able to do everything she wants to do. Her arm swelling has  continued to resolve, though her leg swelling persists. She denies any new concerns.   She is scheduled to have her bone density scan next week 08/16/17.   Lab results today (08/09/17) of CBC w/diff, CMP is as follows: all values are WNL except for WBC at 2.3k, PLT at 63k, ANC at 1.4k, Lymphs abs at 500, Total Protein at 6.4. Magnesium 08/09/17 is WNL at 2.0 Uric acid 08/09/17 is WNL at 3.6  On review of systems, pt reports good energy levels, resolved arm swelling, persisting leg swelling, moving her bowels well, and denies fevers, chills, night sweats, nausea, vomiting, abdominal pains, and any other symptoms.   MEDICAL HISTORY:  Past Medical History:  Diagnosis Date  . 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 Cancer [facility])    breast lymphoma with mets  . Depression   . GERD (gastroesophageal reflux disease)   . Hepatitis   . Hepatitis B 02/1995  . Seizures ([place])    hx of seizure in 1993 and 1994 - caused by stress per patient   -Diverticulitis  -Osteopenia  -Acute gastritis   SURGICAL HISTORY: Past Surgical History:  Procedure Laterality Date  . APPENDECTOMY    . COLONOSCOPY WITH PROPOFOL N/A 12/29/2015   Procedure: COLONOSCOPY WITH PROPOFOL;  Surgeon: Garlan Fair, MD;  Location: [facility];  Service: Endoscopy;  Laterality: N/A;  . TONSILLECTOMY AND ADENOIDECTOMY Bilateral 1954  . TUBAL LIGATION  SOCIAL HISTORY: Social History   Socioeconomic History  . Marital status: Single    Spouse name: Not on file  . Number of children: Not on file  . Years of education: Not on file  . Highest education level: Not on file  Occupational History  . Not on file  Social Needs  . Financial resource strain: Not on file  . Food insecurity:    Worry: Not on file    Inability: Not on file  . Transportation needs:    Medical: Not on file    Non-medical: Not on file  Tobacco Use  . Smoking status: Never Smoker  . Smokeless tobacco: Never Used  Substance and Sexual  Activity  . Alcohol use: No  . Drug use: No  . Sexual activity: Never    Birth control/protection: None  Lifestyle  . Physical activity:    Days per week: Not on file    Minutes per session: Not on file  . Stress: Not on file  Relationships  . Social connections:    Talks on phone: Not on file    Gets together: Not on file    Attends religious service: Not on file    Active member of club or organization: Not on file    Attends meetings of clubs or organizations: Not on file    Relationship status: Not on file  . Intimate partner violence:    Fear of current or ex partner: Not on file    Emotionally abused: Not on file    Physically abused: Not on file    Forced sexual activity: Not on file  Other Topics Concern  . Not on file  Social History Narrative  . Not on file    FAMILY HISTORY: Family History  Problem Relation Age of Onset  . Alzheimer's disease Sister   . Dementia Sister   . Sexual abuse Sister   . Seizures Sister   . Alcohol abuse Brother   . Alcohol abuse Maternal Uncle   . Drug abuse Cousin   . Drug abuse Son   . Depression Neg Hx     ALLERGIES:  is allergic to penicillins.  MEDICATIONS:  Current Outpatient Medications  Medication Sig Dispense Refill  . acyclovir (ZOVIRAX) 400 MG tablet Take 1 tablet (400 mg total) by mouth daily. 30 tablet 3  . allopurinol (ZYLOPRIM) 300 MG tablet Take 0.5 tablets (150 mg total) by mouth daily. (Patient not taking: Reported on 07/17/2017) 30 tablet 0  . Calcium Carb-Cholecalciferol 600-100 MG-UNIT CAPS Take 1 tablet by mouth daily.    . calcium-vitamin D (OSCAL WITH D) 500-200 MG-UNIT tablet Take 1 tablet by mouth 3 (three) times daily. 90 tablet 12  . dexamethasone (DECADRON) 4 MG tablet Take 2 tablets (8 mg total) by mouth daily. Start the day after bendamustine chemotherapy for 2 days. Take with food. 30 tablet 1  . HYDROcodone-acetaminophen (NORCO/VICODIN) 5-325 MG tablet TK 1 T PO  Q 4 H PRN P  0  . LORazepam  (ATIVAN) 0.5 MG tablet Take 1 tablet (0.5 mg total) by mouth every 6 (six) hours as needed (Nausea or vomiting). 30 tablet 0  . ondansetron (ZOFRAN) 8 MG tablet Take 1 tablet (8 mg total) by mouth 2 (two) times daily as needed for refractory nausea / vomiting. Start on day 2 after bendamustine chemo. 30 tablet 1  . prochlorperazine (COMPAZINE) 10 MG tablet Take 1 tablet (10 mg total) by mouth every 6 (six) hours as needed (Nausea or  vomiting). 30 tablet 1  . risperiDONE (RISPERDAL) 0.5 MG tablet Take 1 tablet (0.5 mg total) by mouth at bedtime. 90 tablet 0  . sertraline (ZOLOFT) 50 MG tablet Take 1 tablet (50 mg total) by mouth daily. 90 tablet 0  . Tenofovir Alafenamide Fumarate 25 MG TABS Take 1 tablet (25 mg total) by mouth daily. 30 tablet 5  . vitamin C (ASCORBIC ACID) 500 MG tablet Take 500 mg by mouth daily.     No current facility-administered medications for this visit.     REVIEW OF SYSTEMS:   A 10+ POINT REVIEW OF SYSTEMS WAS OBTAINED including neurology, dermatology, psychiatry, cardiac, respiratory, lymph, extremities, GI, GU, Musculoskeletal, constitutional, breasts, reproductive, HEENT.  All pertinent positives are noted in the HPI.  All others are negative.   PHYSICAL EXAMINATION: ECOG PERFORMANCE STATUS: 1 - Symptomatic but completely ambulatory  Vitals:   08/09/17 0814  BP: (!) 130/58  Pulse: (!) 59  Resp: 18  Temp: 98.3 F (36.8 C)  SpO2: 99%   Filed Weights   08/09/17 0814  Weight: 171 lb 6.4 oz (77.7 kg)   .Body mass index is 30.36 kg/m.  GENERAL:alert, in no acute distress and comfortable SKIN: no acute rashes, no significant lesions EYES: conjunctiva are pink and non-injected, sclera anicteric OROPHARYNX: MMM, no exudates, no oropharyngeal erythema or ulceration NECK: supple, no JVD LYMPH:  no palpable lymphadenopathy in the inguinal regions, (+) small palpable LN in the upper neck and middle right neck, very small palpable LN in the right axilla  LUNGS:  clear to auscultation b/l with normal respiratory effort HEART: regular rate & rhythm ABDOMEN:  normoactive bowel sounds , non tender, not distended. No palpable hepatosplenomegaly.  Extremity: no pedal edema PSYCH: alert & oriented x 3 with fluent speech NEURO: no focal motor/sensory deficits   LABORATORY DATA:  I have reviewed the data as listed  . CBC Latest Ref Rng & Units 08/09/2017 07/24/2017 07/12/2017  WBC 3.9 - 10.3 K/uL 2.3(L) 2.3(L) 5.1  Hemoglobin 11.6 - 15.9 g/dL 12.3 11.2(L) 11.2(L)  Hematocrit 34.8 - 46.6 % 37.1 35.3 34.0(L)  Platelets 145 - 400 K/uL 63(L) 108(L) 126(L)   ANC 1400 . CMP Latest Ref Rng & Units 08/09/2017 07/24/2017 07/12/2017  Glucose 70 - 99 mg/dL 94 93 82  BUN 8 - 23 mg/dL 13 10 15   Creatinine 0.44 - 1.00 mg/dL 0.90 0.87 1.03  Sodium 135 - 145 mmol/L 139 141 142  Potassium 3.5 - 5.1 mmol/L 3.8 3.4(L) 3.8  Chloride 98 - 111 mmol/L 103 103 108  CO2 22 - 32 mmol/L 28 30(H) 26  Calcium 8.9 - 10.3 mg/dL 9.2 9.1 8.8  Total Protein 6.5 - 8.1 g/dL 6.4(L) 5.8(L) 6.0(L)  Total Bilirubin 0.3 - 1.2 mg/dL 0.5 0.6 0.5  Alkaline Phos 38 - 126 U/L 98 86 120  AST 15 - 41 U/L 19 20 19   ALT 0 - 44 U/L 13 10 7    Component     Latest Ref Rng & Units 05/02/2017 06/29/2017  HBV DNA SERPL PCR-ACNC     IU/mL 290 HBV DNA not detected  HBV DNA SERPL PCR-LOG IU     log10 IU/mL 2.462 UNABLE TO CALCULATE  Test Info:      Comment Comment   Component     Latest Ref Rng & Units 07/12/2017  HBV DNA SERPL PCR-ACNC     IU/mL HBV DNA not detected  HBV DNA SERPL PCR-LOG IU     log10 IU/mL UNABLE TO  CALCULATE  Test Info:      Comment   Component     Latest Ref Rng & Units 05/02/2017  HBV DNA SERPL PCR-ACNC     IU/mL 290  HBV DNA SERPL PCR-LOG IU     log10 IU/mL 2.462  Test Info:      Comment  Hep B Core Ab, IgM     Negative Negative  Hep B Core Ab, Tot     Negative Positive (A)  Hepatitis B Surface Ag     Negative Confirm. indicated  HCV Ab     0.0 - 0.9 s/co ratio  <0.1  HBsAg Conf      Positive (A)    PATHOLOGY    Diagnosis 04/25/17  Lymph node, needle/core biopsy, left - FOLLICULAR LYMPHOMA, GRADE 1-2. - SEE ONCOLOGY TABLE. Microscopic Comment LYMPHOMA Histologic type: Non-Hodgkin B-cell lymphoma: Follicular lymphoma. Grade (if applicable): Low grade (grade 1-2). Flow cytometry: N/A. Immunohistochemical stains: CD20, CD3, CD5, CD10, CD23, CD43, bcl-2, bcl-6, CD138, light chains, Ki-67. Touch preps/imprints: N/A. Comments: Core biopsies reveal effacement by a vaguely nodular infiltrate of atypical lymphocytes. The lymphocytes are predominately small with irregular nuclear contours. There are no diffuse areas of large cells. Immunohistochemistry reveals abundant CD20-positive B-cells. CD3, CD43, and CD5 highlight interfollicular T-cells. Bcl-6 and CD23 reveal numerous small follicles with bcl-6 positive cells extending outside the follicular dendritic networks. The majority of these cells are bcl-2 positive. CD10 reveals only a few small residual germinal center foci. CD138 highlights scattered plasma cells which are polytypic by light chain in situ hybridization. The overall findings are most consistent with a low grade follicular lymphoma. Dr. Isaiah Blakes was notified on 04/27/2017.   RADIOGRAPHIC STUDIES: I have personally reviewed the radiological images as listed and agreed with the findings in the report. Xr Humerus Right  Result Date: 07/18/2017 Stable alignment of fracture with abundant callus formation.   Screening Mammogram 04/17/17 IMPRESSION:  The New enlarged nodes in both axillae are indeterminate. An [name] is recommended.    ASSESSMENT & PLAN:   Brooke Huber is a 72 y.o. Caucasian female with   1. 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  2. Mild thrombocytopenia-- likely from lymphoma. ? BM involvement vs Immune thrombocytopenia.+ chemotherapy PLT 63k   3. Chronic active Hepatitis B -Hepatitis B labs from 05/02/17 revealed chronic active disease which will be a limiting factor in her treatment.  -Pt has been started on Tenofovir AF with ID, Dr Linus Salmons on 05/24/17 -Tenofovir has successfully suppressed her Hep B virus as of 06/29/17 and 07/24/2017 labs  PLAN  -Discussed pt labwork today, 08/09/17; HGB normal at 12.3, PLT decreased to 63k, ANC at 1.4k -Will delay two days of Bendamustine by one week, given PLT <75k at 63k. -Will continue with Rituxan today as planned -Will check PLT next week prior to tentative Bendamustine infusion  -Will see pt back in 5 weeks -discussed to Continue walking 20-30 minutes each day, staying hydrated, and continue eating well -Will repeat PET after C3 -discussed with patient about stopping Allopurinol. -Offered port placement, pt declined at this time but will let me know if she needs to have this placed -Zofran and Ativan for nausea as needed  Patient will  get only Rituxan today Please schedule Bendamustine D1 and D2 in 7 days with labs. Adjust future dates of treatment per orders RTC with Dr Irene Limbo in 5 weeks with next cycle of treatment with labs     All of the patients questions were answered with apparent satisfaction. The patient knows to call the clinic with any problems, questions or concerns.  . The total time spent in the appointment was 25 minutes and more than 50% was on counseling and direct patient cares.         Sullivan Lone MD MS AAHIVMS [facility] Hematology/Oncology Physician [facility]  (Office):       [phone] (Work cell):  [phone] (Fax):           [phone]  08/09/2017 9:16 AM  I, Baldwin [place],  am acting as a Education administrator for Dr Irene Limbo.   .I have reviewed the above documentation for accuracy and completeness, and I agree with the above. Brunetta Genera MD

## 2017-08-09 ENCOUNTER — Inpatient Hospital Stay (HOSPITAL_BASED_OUTPATIENT_CLINIC_OR_DEPARTMENT_OTHER): Payer: PPO | Admitting: Medical

## 2017-08-09 ENCOUNTER — Inpatient Hospital Stay: Payer: PPO

## 2017-08-09 ENCOUNTER — Ambulatory Visit: Payer: Self-pay

## 2017-08-09 ENCOUNTER — Inpatient Hospital Stay (HOSPITAL_BASED_OUTPATIENT_CLINIC_OR_DEPARTMENT_OTHER): Payer: PPO | Admitting: Hematology

## 2017-08-09 ENCOUNTER — Encounter: Payer: Self-pay | Admitting: Hematology

## 2017-08-09 ENCOUNTER — Inpatient Hospital Stay: Payer: PPO | Attending: Hematology

## 2017-08-09 ENCOUNTER — Telehealth: Payer: Self-pay | Admitting: Hematology

## 2017-08-09 VITALS — BP 130/58 | HR 59 | Temp 98.3°F | Resp 18 | Ht 63.0 in | Wt 171.4 lb

## 2017-08-09 VITALS — BP 116/45 | HR 60 | Temp 97.9°F | Resp 16

## 2017-08-09 DIAGNOSIS — B181 Chronic viral hepatitis B without delta-agent: Secondary | ICD-10-CM | POA: Diagnosis not present

## 2017-08-09 DIAGNOSIS — T451X5S Adverse effect of antineoplastic and immunosuppressive drugs, sequela: Secondary | ICD-10-CM | POA: Insufficient documentation

## 2017-08-09 DIAGNOSIS — C8218 Follicular lymphoma grade II, lymph nodes of multiple sites: Secondary | ICD-10-CM | POA: Insufficient documentation

## 2017-08-09 DIAGNOSIS — R600 Localized edema: Secondary | ICD-10-CM

## 2017-08-09 DIAGNOSIS — T80818A Extravasation of other vesicant agent, initial encounter: Secondary | ICD-10-CM

## 2017-08-09 DIAGNOSIS — F418 Other specified anxiety disorders: Secondary | ICD-10-CM

## 2017-08-09 DIAGNOSIS — M1990 Unspecified osteoarthritis, unspecified site: Secondary | ICD-10-CM

## 2017-08-09 DIAGNOSIS — Z79899 Other long term (current) drug therapy: Secondary | ICD-10-CM | POA: Diagnosis not present

## 2017-08-09 DIAGNOSIS — D701 Agranulocytosis secondary to cancer chemotherapy: Secondary | ICD-10-CM

## 2017-08-09 DIAGNOSIS — M8580 Other specified disorders of bone density and structure, unspecified site: Secondary | ICD-10-CM | POA: Diagnosis not present

## 2017-08-09 DIAGNOSIS — Z5111 Encounter for antineoplastic chemotherapy: Secondary | ICD-10-CM

## 2017-08-09 DIAGNOSIS — Z5112 Encounter for antineoplastic immunotherapy: Secondary | ICD-10-CM | POA: Diagnosis not present

## 2017-08-09 DIAGNOSIS — D696 Thrombocytopenia, unspecified: Secondary | ICD-10-CM | POA: Diagnosis not present

## 2017-08-09 DIAGNOSIS — K219 Gastro-esophageal reflux disease without esophagitis: Secondary | ICD-10-CM | POA: Diagnosis not present

## 2017-08-09 DIAGNOSIS — T451X5A Adverse effect of antineoplastic and immunosuppressive drugs, initial encounter: Secondary | ICD-10-CM

## 2017-08-09 LAB — CBC WITH DIFFERENTIAL/PLATELET
BASOS ABS: 0 10*3/uL (ref 0.0–0.1)
Basophils Relative: 0 %
EOS ABS: 0.1 10*3/uL (ref 0.0–0.5)
Eosinophils Relative: 3 %
HCT: 37.1 % (ref 34.8–46.6)
Hemoglobin: 12.3 g/dL (ref 11.6–15.9)
LYMPHS ABS: 0.5 10*3/uL — AB (ref 0.9–3.3)
LYMPHS PCT: 23 %
MCH: 28.9 pg (ref 25.1–34.0)
MCHC: 33.2 g/dL (ref 31.5–36.0)
MCV: 87.1 fL (ref 79.5–101.0)
MONO ABS: 0.3 10*3/uL (ref 0.1–0.9)
Monocytes Relative: 14 %
Neutro Abs: 1.4 10*3/uL — ABNORMAL LOW (ref 1.5–6.5)
Neutrophils Relative %: 60 %
Platelets: 63 10*3/uL — ABNORMAL LOW (ref 145–400)
RBC: 4.26 MIL/uL (ref 3.70–5.45)
RDW: 14.3 % (ref 11.2–14.5)
WBC: 2.3 10*3/uL — AB (ref 3.9–10.3)

## 2017-08-09 LAB — URIC ACID: URIC ACID, SERUM: 3.6 mg/dL (ref 2.5–7.1)

## 2017-08-09 LAB — CMP (CANCER CENTER ONLY)
ALT: 13 U/L (ref 0–44)
AST: 19 U/L (ref 15–41)
Albumin: 3.9 g/dL (ref 3.5–5.0)
Alkaline Phosphatase: 98 U/L (ref 38–126)
Anion gap: 8 (ref 5–15)
BUN: 13 mg/dL (ref 8–23)
CO2: 28 mmol/L (ref 22–32)
CREATININE: 0.9 mg/dL (ref 0.44–1.00)
Calcium: 9.2 mg/dL (ref 8.9–10.3)
Chloride: 103 mmol/L (ref 98–111)
GFR, Est AFR Am: >60 mL/min (ref >60)
GLUCOSE: 94 mg/dL (ref 70–99)
POTASSIUM: 3.8 mmol/L (ref 3.5–5.1)
Sodium: 139 mmol/L (ref 135–145)
TOTAL PROTEIN: 6.4 g/dL — AB (ref 6.5–8.1)
Total Bilirubin: 0.5 mg/dL (ref 0.3–1.2)

## 2017-08-09 LAB — MAGNESIUM: Magnesium: 2 mg/dL (ref 1.7–2.4)

## 2017-08-09 MED ORDER — DEXAMETHASONE SODIUM PHOSPHATE 10 MG/ML IJ SOLN
10.0000 mg | Freq: Once | INTRAMUSCULAR | Status: AC
  Administered 2017-08-09: 10 mg via INTRAVENOUS

## 2017-08-09 MED ORDER — DIPHENHYDRAMINE HCL 25 MG PO CAPS
50.0000 mg | ORAL_CAPSULE | Freq: Once | ORAL | Status: AC
  Administered 2017-08-09: 50 mg via ORAL

## 2017-08-09 MED ORDER — DEXAMETHASONE SODIUM PHOSPHATE 10 MG/ML IJ SOLN
INTRAMUSCULAR | Status: AC
  Filled 2017-08-09: qty 1

## 2017-08-09 MED ORDER — FAMOTIDINE 20 MG PO TABS
ORAL_TABLET | ORAL | Status: AC
  Filled 2017-08-09: qty 2

## 2017-08-09 MED ORDER — FAMOTIDINE 20 MG PO TABS
40.0000 mg | ORAL_TABLET | Freq: Once | ORAL | Status: AC
  Administered 2017-08-09: 40 mg via ORAL

## 2017-08-09 MED ORDER — SODIUM CHLORIDE 0.9 % IV SOLN
375.0000 mg/m2 | Freq: Once | INTRAVENOUS | Status: AC
  Administered 2017-08-09: 700 mg via INTRAVENOUS
  Filled 2017-08-09: qty 20

## 2017-08-09 MED ORDER — ACETAMINOPHEN 325 MG PO TABS
650.0000 mg | ORAL_TABLET | Freq: Once | ORAL | Status: AC
  Administered 2017-08-09: 650 mg via ORAL

## 2017-08-09 MED ORDER — ACETAMINOPHEN 325 MG PO TABS
ORAL_TABLET | ORAL | Status: AC
  Filled 2017-08-09: qty 2

## 2017-08-09 MED ORDER — DIPHENHYDRAMINE HCL 25 MG PO CAPS
ORAL_CAPSULE | ORAL | Status: AC
  Filled 2017-08-09: qty 2

## 2017-08-09 MED ORDER — SODIUM CHLORIDE 0.9 % IV SOLN
Freq: Once | INTRAVENOUS | Status: AC
Start: 2017-08-09 — End: 2017-08-09
  Administered 2017-08-09: 10:00:00 via INTRAVENOUS

## 2017-08-09 NOTE — Telephone Encounter (Signed)
LMVM appointments scheduled Letter/Calendar mailed to patient per 7/10 los

## 2017-08-09 NOTE — Progress Notes (Signed)
Pt saw Dr. Irene Limbo today prior to chemo.  Per Lanelle Bal, RN desk nurse - pt to receive only Rituxan , and No Bendeka due to low platelet counts.

## 2017-08-09 NOTE — Patient Instructions (Signed)
[facility] Discharge Instructions for Patients Receiving Chemotherapy  Today you received the following chemotherapy agents:  Rituxan   To help prevent nausea and vomiting after your treatment, we encourage you to take your nausea medication as prescribed.   If you develop nausea and vomiting that is not controlled by your nausea medication, call the clinic.   BELOW ARE SYMPTOMS THAT SHOULD BE REPORTED IMMEDIATELY:  *FEVER GREATER THAN 100.5 F  *CHILLS WITH OR WITHOUT FEVER  NAUSEA AND VOMITING THAT IS NOT CONTROLLED WITH YOUR NAUSEA MEDICATION  *UNUSUAL SHORTNESS OF BREATH  *UNUSUAL BRUISING OR BLEEDING  TENDERNESS IN MOUTH AND THROAT WITH OR WITHOUT PRESENCE OF ULCERS  *URINARY PROBLEMS  *BOWEL PROBLEMS  UNUSUAL RASH Items with * indicate a potential emergency and should be followed up as soon as possible.  Feel free to call the clinic should you have any questions or concerns. The clinic phone number is [phone].  Please show the CHEMO ALERT CARD at check-in to the Emergency Department and triage nurse.   

## 2017-08-10 ENCOUNTER — Inpatient Hospital Stay: Payer: PPO

## 2017-08-10 ENCOUNTER — Ambulatory Visit: Payer: Self-pay

## 2017-08-11 NOTE — Progress Notes (Signed)
   DATE: 08/09/2017     IV EXTRAVASATION (NEUTRAL)  MD: Dr. Sullivan Lone  AGENT RECEIVED AT TIME OF EXTRAVASATION:   Rituximab  IV SITE LOCATION: Left lateral wrist  INTERVENTION:  1) IV stopped  2) 1 ml liquid/blood aspirated from IV site.  3) Patient Teaching Instruction Sheet reviewed with Brooke Huber.  A) Apply cold to area for 15 to 20 minutes 4 times daily for the next 48 hours B) Elevate the affected site for the next 48 hours. C) No need to return for evaluation in 24 hours, 48 hours and 7 days due to rituximab being a neutral agent. D) Brooke Huber was instructed to call [phone] if she has questions or notes acute changes to the area.  Review of Systems  Constitutional: Negative for chills, diaphoresis and fever.  Eyes: Negative for visual disturbance.  Gastrointestinal: Negative for nausea and vomiting.  Skin:       Diffuse swelling at the site of a left lateral IV insertion site.  Neurological: Negative for dizziness and headaches.    Physical Exam  Constitutional: No distress.  Skin: Skin is warm and dry. She is not diaphoretic.  Diffuse swelling at the insertion site of an IV in the left lateral wrist.    Brooke Huber, MHS, PA-C

## 2017-08-16 DIAGNOSIS — M8588 Other specified disorders of bone density and structure, other site: Secondary | ICD-10-CM | POA: Diagnosis not present

## 2017-08-17 ENCOUNTER — Ambulatory Visit: Payer: Self-pay | Admitting: Hematology

## 2017-08-17 ENCOUNTER — Inpatient Hospital Stay: Payer: PPO

## 2017-08-17 ENCOUNTER — Other Ambulatory Visit: Payer: Self-pay | Admitting: Hematology

## 2017-08-17 VITALS — BP 133/65 | HR 63 | Temp 98.5°F | Resp 16

## 2017-08-17 DIAGNOSIS — D696 Thrombocytopenia, unspecified: Secondary | ICD-10-CM

## 2017-08-17 DIAGNOSIS — C8218 Follicular lymphoma grade II, lymph nodes of multiple sites: Secondary | ICD-10-CM

## 2017-08-17 DIAGNOSIS — Z5112 Encounter for antineoplastic immunotherapy: Secondary | ICD-10-CM | POA: Diagnosis not present

## 2017-08-17 LAB — CBC WITH DIFFERENTIAL/PLATELET
BASOS PCT: 1 %
Basophils Absolute: 0 10*3/uL (ref 0.0–0.1)
EOS ABS: 0.1 10*3/uL (ref 0.0–0.5)
Eosinophils Relative: 4 %
HCT: 36.1 % (ref 34.8–46.6)
Hemoglobin: 12.2 g/dL (ref 11.6–15.9)
Lymphocytes Relative: 19 %
Lymphs Abs: 0.5 10*3/uL — ABNORMAL LOW (ref 0.9–3.3)
MCH: 29 pg (ref 25.1–34.0)
MCHC: 33.9 g/dL (ref 31.5–36.0)
MCV: 85.5 fL (ref 79.5–101.0)
MONO ABS: 0.3 10*3/uL (ref 0.1–0.9)
MONOS PCT: 13 %
Neutro Abs: 1.6 10*3/uL (ref 1.5–6.5)
Neutrophils Relative %: 63 %
Platelets: 135 10*3/uL — ABNORMAL LOW (ref 145–400)
RBC: 4.22 MIL/uL (ref 3.70–5.45)
RDW: 15.5 % — AB (ref 11.2–14.5)
WBC: 2.6 10*3/uL — ABNORMAL LOW (ref 3.9–10.3)

## 2017-08-17 MED ORDER — SODIUM CHLORIDE 0.9 % IV SOLN
70.0000 mg/m2 | Freq: Once | INTRAVENOUS | Status: AC
  Administered 2017-08-17: 125 mg via INTRAVENOUS
  Filled 2017-08-17: qty 5

## 2017-08-17 MED ORDER — SODIUM CHLORIDE 0.9 % IV SOLN
Freq: Once | INTRAVENOUS | Status: AC
  Administered 2017-08-17: 13:00:00 via INTRAVENOUS

## 2017-08-17 MED ORDER — DEXAMETHASONE SODIUM PHOSPHATE 10 MG/ML IJ SOLN
10.0000 mg | Freq: Once | INTRAMUSCULAR | Status: AC
  Administered 2017-08-17: 10 mg via INTRAVENOUS

## 2017-08-17 MED ORDER — PALONOSETRON HCL INJECTION 0.25 MG/5ML
INTRAVENOUS | Status: AC
  Filled 2017-08-17: qty 5

## 2017-08-17 MED ORDER — PALONOSETRON HCL INJECTION 0.25 MG/5ML
0.2500 mg | Freq: Once | INTRAVENOUS | Status: AC
  Administered 2017-08-17: 0.25 mg via INTRAVENOUS

## 2017-08-17 MED ORDER — DEXAMETHASONE SODIUM PHOSPHATE 10 MG/ML IJ SOLN
INTRAMUSCULAR | Status: AC
  Filled 2017-08-17: qty 1

## 2017-08-17 NOTE — Progress Notes (Signed)
Per Dr Irene Limbo pt does not need a CMP for [facility] today

## 2017-08-17 NOTE — Patient Instructions (Signed)
[place] Discharge Instructions for Patients Receiving Chemotherapy  Today you received the following chemotherapy agents:  Bendeka (bendamustine)  To help prevent nausea and vomiting after your treatment, we encourage you to take your nausea medication as prescribed    If you develop nausea and vomiting that is not controlled by your nausea medication, call the clinic.   BELOW ARE SYMPTOMS THAT SHOULD BE REPORTED IMMEDIATELY:  *FEVER GREATER THAN 100.5 F  *CHILLS WITH OR WITHOUT FEVER  NAUSEA AND VOMITING THAT IS NOT CONTROLLED WITH YOUR NAUSEA MEDICATION  *UNUSUAL SHORTNESS OF BREATH  *UNUSUAL BRUISING OR BLEEDING  TENDERNESS IN MOUTH AND THROAT WITH OR WITHOUT PRESENCE OF ULCERS  *URINARY PROBLEMS  *BOWEL PROBLEMS  UNUSUAL RASH Items with * indicate a potential emergency and should be followed up as soon as possible.  Feel free to call the clinic should you have any questions or concerns. The clinic phone number is [phone].  Please show the [place] at check-in to the Emergency Department and triage nurse.

## 2017-08-18 ENCOUNTER — Inpatient Hospital Stay: Payer: PPO

## 2017-08-18 VITALS — BP 127/53 | HR 60 | Temp 98.2°F | Resp 16

## 2017-08-18 DIAGNOSIS — C8218 Follicular lymphoma grade II, lymph nodes of multiple sites: Secondary | ICD-10-CM

## 2017-08-18 DIAGNOSIS — Z5112 Encounter for antineoplastic immunotherapy: Secondary | ICD-10-CM | POA: Diagnosis not present

## 2017-08-18 MED ORDER — FAMOTIDINE 20 MG PO TABS
40.0000 mg | ORAL_TABLET | Freq: Once | ORAL | Status: AC
  Administered 2017-08-18: 40 mg via ORAL

## 2017-08-18 MED ORDER — SODIUM CHLORIDE 0.9 % IV SOLN
Freq: Once | INTRAVENOUS | Status: AC
  Administered 2017-08-18: 11:00:00 via INTRAVENOUS

## 2017-08-18 MED ORDER — FAMOTIDINE 20 MG PO TABS
ORAL_TABLET | ORAL | Status: AC
  Filled 2017-08-18: qty 2

## 2017-08-18 MED ORDER — DEXAMETHASONE SODIUM PHOSPHATE 10 MG/ML IJ SOLN
INTRAMUSCULAR | Status: AC
  Filled 2017-08-18: qty 1

## 2017-08-18 MED ORDER — DEXAMETHASONE SODIUM PHOSPHATE 10 MG/ML IJ SOLN
10.0000 mg | Freq: Once | INTRAMUSCULAR | Status: AC
  Administered 2017-08-18: 10 mg via INTRAVENOUS

## 2017-08-18 MED ORDER — SODIUM CHLORIDE 0.9 % IV SOLN
70.0000 mg/m2 | Freq: Once | INTRAVENOUS | Status: AC
  Administered 2017-08-18: 125 mg via INTRAVENOUS
  Filled 2017-08-18: qty 5

## 2017-08-18 NOTE — Patient Instructions (Signed)
[place] Discharge Instructions for Patients Receiving Chemotherapy  Today you received the following chemotherapy agents:  Bendeka (bendamustine)  To help prevent nausea and vomiting after your treatment, we encourage you to take your nausea medication as prescribed    If you develop nausea and vomiting that is not controlled by your nausea medication, call the clinic.   BELOW ARE SYMPTOMS THAT SHOULD BE REPORTED IMMEDIATELY:  *FEVER GREATER THAN 100.5 F  *CHILLS WITH OR WITHOUT FEVER  NAUSEA AND VOMITING THAT IS NOT CONTROLLED WITH YOUR NAUSEA MEDICATION  *UNUSUAL SHORTNESS OF BREATH  *UNUSUAL BRUISING OR BLEEDING  TENDERNESS IN MOUTH AND THROAT WITH OR WITHOUT PRESENCE OF ULCERS  *URINARY PROBLEMS  *BOWEL PROBLEMS  UNUSUAL RASH Items with * indicate a potential emergency and should be followed up as soon as possible.  Feel free to call the clinic should you have any questions or concerns. The clinic phone number is [phone].  Please show the [place] at check-in to the Emergency Department and triage nurse.

## 2017-08-29 ENCOUNTER — Ambulatory Visit (INDEPENDENT_AMBULATORY_CARE_PROVIDER_SITE_OTHER): Payer: PPO | Admitting: Orthopaedic Surgery

## 2017-08-29 ENCOUNTER — Ambulatory Visit (INDEPENDENT_AMBULATORY_CARE_PROVIDER_SITE_OTHER): Payer: PPO

## 2017-08-29 ENCOUNTER — Encounter (INDEPENDENT_AMBULATORY_CARE_PROVIDER_SITE_OTHER): Payer: Self-pay | Admitting: Orthopaedic Surgery

## 2017-08-29 DIAGNOSIS — S42331A Displaced oblique fracture of shaft of humerus, right arm, initial encounter for closed fracture: Secondary | ICD-10-CM

## 2017-08-29 NOTE — Progress Notes (Signed)
   Post-Op Visit Note   Patient: Brooke Huber           Date of Birth: 1945-11-02           MRN: [id number] Visit Date: 08/29/2017 PCP: Alroy Dust, L.Marlou Sa, MD   Assessment & Plan:  Chief Complaint:  Chief Complaint  Patient presents with  . Right Shoulder - Pain, Follow-up   Visit Diagnoses:  1. Closed displaced oblique fracture of shaft of right humerus, initial encounter     Plan: Patient is a little over 3 months status post right humeral shaft fracture.  She is overall doing well requiring no pain medicines.  She is able to lift a mattress without pain.  She has no complaints of pain.  She has full elbow and shoulder range of motion.  She has good function.  My standpoint patient is clinically healed.  X-rays show abundant callus formation.  At this point we will have her follow-up with [name] as needed.  Follow-Up Instructions: Return if symptoms worsen or fail to improve.   Orders:  Orders Placed This Encounter  Procedures  . XR Humerus Right   No orders of the defined types were placed in this encounter.   Imaging: Xr Humerus Right  Result Date: 08/29/2017 Abundant callus formation of humeral shaft fracture with stable alignment.   PMFS History: Patient Active Problem List   Diagnosis Date Noted  . Medication monitoring encounter 07/17/2017  . Follicular lymphoma grade II of lymph nodes of multiple sites ([place]) 06/14/2017  . Closed displaced oblique fracture of shaft of right humerus 05/26/2017  . Chronic viral hepatitis B without delta-agent ([place]) 05/23/2017  . MDD (major depressive disorder), recurrent, severe, with psychosis ([place]) 06/09/2014  . PTSD (post-traumatic stress disorder) 06/09/2014  . Panic disorder 06/09/2014  . Bereavement 06/09/2014  . Hypokalemia 06/09/2014  . History of periportal fibrosis 08/31/2006   Past Medical History:  Diagnosis Date  . 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 Cancer [facility])    breast lymphoma with mets  .  Depression   . GERD (gastroesophageal reflux disease)   . Hepatitis   . Hepatitis B 02/1995  . Seizures ([place])    hx of seizure in 1993 and 1994 - caused by stress per patient     Family History  Problem Relation Age of Onset  . Alzheimer's disease Sister   . Dementia Sister   . Sexual abuse Sister   . Seizures Sister   . Alcohol abuse Brother   . Alcohol abuse Maternal Uncle   . Drug abuse Cousin   . Drug abuse Son   . Depression Neg Hx     Past Surgical History:  Procedure Laterality Date  . APPENDECTOMY    . COLONOSCOPY WITH PROPOFOL N/A 12/29/2015   Procedure: COLONOSCOPY WITH PROPOFOL;  Surgeon: Garlan Fair, MD;  Location: [facility];  Service: Endoscopy;  Laterality: N/A;  . TONSILLECTOMY AND ADENOIDECTOMY Bilateral 1954  . TUBAL LIGATION     Social History   Occupational History  . Not on file  Tobacco Use  . Smoking status: Never Smoker  . Smokeless tobacco: Never Used  Substance and Sexual Activity  . Alcohol use: No  . Drug use: No  . Sexual activity: Never    Birth control/protection: None

## 2017-09-05 ENCOUNTER — Ambulatory Visit: Payer: Self-pay

## 2017-09-05 ENCOUNTER — Other Ambulatory Visit: Payer: Self-pay

## 2017-09-05 ENCOUNTER — Ambulatory Visit: Payer: Self-pay | Admitting: Hematology

## 2017-09-06 ENCOUNTER — Ambulatory Visit: Payer: Self-pay

## 2017-09-08 ENCOUNTER — Ambulatory Visit: Payer: Self-pay | Admitting: Hematology

## 2017-09-08 ENCOUNTER — Ambulatory Visit: Payer: Self-pay

## 2017-09-08 ENCOUNTER — Other Ambulatory Visit: Payer: Self-pay

## 2017-09-12 NOTE — Progress Notes (Signed)
HEMATOLOGY/ONCOLOGY CLINIC NOTE  Date of Service:  09/13/17    Patient Care Team: Alroy Dust, L.Marlou Sa, MD as PCP - General (Family Medicine)  CHIEF COMPLAINTS/PURPOSE OF CONSULTATION:  F/u for mx of recently diagnosed Follicular Lymphoma  HISTORY OF PRESENTING ILLNESS:    Brooke Huber is a wonderful 72 y.o. female who has been referred to [name] by her PCP, Dr. 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 She presents to the clinic today accompanied by her friend.   She had a yearly screening mammogram on 04/17/17 which found her to have an enlarged axillary lymph nodes. She had a biopsy on 04/28/17 which showed evidence of lymphoma.   Today she notes she feels fine in general with no changes over the last 6 months. She hopes to go to the beach on the 18th of this month with her friend for 5 days.   She notes years ago in the late 1990s she went to donate blood and they told her she had Hep B, but never had liver damage/change or concerning symptoms. She never had been out of the country, tattoo or blood transfusion. She denies any other major comorbidities. She sees a psychiatrist every 4 months for her depression and anxiety. She notes diagnosis like this can trigger her depression but she is working to maintain her mood during this time.   On review of symptoms, pt notes limited ROM of her left arm for the last 2 years with tenderness. She notes arthritis in her hands and possibly in her shoulder. She denies bowel issues or trouble with bowel movements or passing urine.  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 The patient's last visit with [name] was on 08/09/17. She is accompanied today by a friend. The pt reports that she is doing well overall.   The pt reports that besides feeling a little tired, she hasn't had any new concerns, and denies any concerns of infection in the interim. She  notes that she has maintained compliance with her Tenofovir.   The pt notes that she has gained some weight, has been eating well, staying well hydrated and has been staying active.   Lab results today (09/13/17) of CBC w/diff, CMP, and Reticulocytes is as follows: all values are WNL except for WBC at 3.1k, RDW at 16.9, PLT at 69k, Lymphs abs at 600, Calcium at 8.7, Total Protein at 6.1, Retic ct abs at 32.9k.  On review of systems, pt reports feeling a little tired, eating well, staying hydrated, staying hydrated, and denies bone pains, concerns for infections, concerns for bleeding, abdominal pains, leg swelling, and any other symptoms.   MEDICAL HISTORY:  Past Medical History:  Diagnosis Date  . 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 Cancer [facility])    breast lymphoma with mets  . Depression   . GERD (gastroesophageal reflux disease)   . Hepatitis   . Hepatitis B 02/1995  . Seizures ([place])    hx of seizure in 1993 and 1994 - caused by stress per patient   -Diverticulitis  -Osteopenia  -Acute gastritis   SURGICAL HISTORY: Past Surgical History:  Procedure Laterality Date  . APPENDECTOMY    . COLONOSCOPY WITH PROPOFOL N/A 12/29/2015   Procedure: COLONOSCOPY WITH PROPOFOL;  Surgeon: Garlan Fair, MD;  Location: [facility] ENDOSCOPY;  Service: Endoscopy;  Laterality: N/A;  . TONSILLECTOMY AND ADENOIDECTOMY Bilateral 1954  . TUBAL LIGATION  SOCIAL HISTORY: Social History   Socioeconomic History  . Marital status: Single    Spouse name: Not on file  . Number of children: Not on file  . Years of education: Not on file  . Highest education level: Not on file  Occupational History  . Not on file  Social Needs  . Financial resource strain: Not on file  . Food insecurity:    Worry: Not on file    Inability: Not on file  . Transportation needs:    Medical: Not on file    Non-medical: Not on file  Tobacco Use  . Smoking status: Never Smoker  . Smokeless tobacco: Never  Used  Substance and Sexual Activity  . Alcohol use: No  . Drug use: No  . Sexual activity: Never    Birth control/protection: None  Lifestyle  . Physical activity:    Days per week: Not on file    Minutes per session: Not on file  . Stress: Not on file  Relationships  . Social connections:    Talks on phone: Not on file    Gets together: Not on file    Attends religious service: Not on file    Active member of club or organization: Not on file    Attends meetings of clubs or organizations: Not on file    Relationship status: Not on file  . Intimate partner violence:    Fear of current or ex partner: Not on file    Emotionally abused: Not on file    Physically abused: Not on file    Forced sexual activity: Not on file  Other Topics Concern  . Not on file  Social History Narrative  . Not on file    FAMILY HISTORY: Family History  Problem Relation Age of Onset  . Alzheimer's disease Sister   . Dementia Sister   . Sexual abuse Sister   . Seizures Sister   . Alcohol abuse Brother   . Alcohol abuse Maternal Uncle   . Drug abuse Cousin   . Drug abuse Son   . Depression Neg Hx     ALLERGIES:  is allergic to penicillins.  MEDICATIONS:  Current Outpatient Medications  Medication Sig Dispense Refill  . acyclovir (ZOVIRAX) 400 MG tablet Take 1 tablet (400 mg total) by mouth daily. 30 tablet 3  . allopurinol (ZYLOPRIM) 300 MG tablet Take 0.5 tablets (150 mg total) by mouth daily. 30 tablet 0  . Calcium Carb-Cholecalciferol 600-100 MG-UNIT CAPS Take 1 tablet by mouth daily.    . calcium-vitamin D (OSCAL WITH D) 500-200 MG-UNIT tablet Take 1 tablet by mouth 3 (three) times daily. 90 tablet 12  . dexamethasone (DECADRON) 4 MG tablet Take 2 tablets (8 mg total) by mouth daily. Start the day after bendamustine chemotherapy for 2 days. Take with food. 30 tablet 1  . HYDROcodone-acetaminophen (NORCO/VICODIN) 5-325 MG tablet TK 1 T PO  Q 4 H PRN P  0  . LORazepam (ATIVAN) 0.5 MG  tablet Take 1 tablet (0.5 mg total) by mouth every 6 (six) hours as needed (Nausea or vomiting). 30 tablet 0  . ondansetron (ZOFRAN) 8 MG tablet Take 1 tablet (8 mg total) by mouth 2 (two) times daily as needed for refractory nausea / vomiting. Start on day 2 after bendamustine chemo. 30 tablet 1  . prochlorperazine (COMPAZINE) 10 MG tablet Take 1 tablet (10 mg total) by mouth every 6 (six) hours as needed (Nausea or vomiting). 30 tablet 1  .  risperiDONE (RISPERDAL) 0.5 MG tablet Take 1 tablet (0.5 mg total) by mouth at bedtime. 90 tablet 0  . sertraline (ZOLOFT) 50 MG tablet Take 1 tablet (50 mg total) by mouth daily. 90 tablet 0  . Tenofovir Alafenamide Fumarate 25 MG TABS Take 1 tablet (25 mg total) by mouth daily. 30 tablet 5  . vitamin C (ASCORBIC ACID) 500 MG tablet Take 500 mg by mouth daily.     No current facility-administered medications for this visit.     REVIEW OF SYSTEMS:   A 10+ POINT REVIEW OF SYSTEMS WAS OBTAINED including neurology, dermatology, psychiatry, cardiac, respiratory, lymph, extremities, GI, GU, Musculoskeletal, constitutional, breasts, reproductive, HEENT.  All pertinent positives are noted in the HPI.  All others are negative.   PHYSICAL EXAMINATION: ECOG PERFORMANCE STATUS: 1 - Symptomatic but completely ambulatory  Vitals:   09/13/17 0849  BP: 135/68  Pulse: 60  Resp: 18  Temp: 98.4 F (36.9 C)  SpO2: 97%   Filed Weights   09/13/17 0849  Weight: 170 lb 6.4 oz (77.3 kg)   .Body mass index is 30.19 kg/m.  GENERAL:alert, in no acute distress and comfortable SKIN: no acute rashes, no significant lesions EYES: conjunctiva are pink and non-injected, sclera anicteric OROPHARYNX: MMM, no exudates, no oropharyngeal erythema or ulceration NECK: supple, no JVD LYMPH:  no palpable lymphadenopathy in the  inguinal regions, (+) small palpable LN in the upper neck and middle right neck, very small palpable LN in the right axilla LUNGS: clear to auscultation  b/l with normal respiratory effort HEART: regular rate & rhythm ABDOMEN:  normoactive bowel sounds , non tender, not distended. No palpable hepatosplenomegaly.  Extremity: no pedal edema PSYCH: alert & oriented x 3 with fluent speech NEURO: no focal motor/sensory deficits   LABORATORY DATA:  I have reviewed the data as listed  . CBC Latest Ref Rng & Units 09/13/2017 08/17/2017 08/09/2017  WBC 3.9 - 10.3 K/uL 3.1(L) 2.6(L) 2.3(L)  Hemoglobin 11.6 - 15.9 g/dL 12.1 12.2 12.3  Hematocrit 34.8 - 46.6 % 36.2 36.1 37.1  Platelets 145 - 400 K/uL 69(L) 135(L) 63(L)   ANC 1800 . CMP Latest Ref Rng & Units 09/13/2017 08/09/2017 07/24/2017  Glucose 70 - 99 mg/dL 96 94 93  BUN 8 - 23 mg/dL 12 13 10   Creatinine 0.44 - 1.00 mg/dL 0.93 0.90 0.87  Sodium 135 - 145 mmol/L 141 139 141  Potassium 3.5 - 5.1 mmol/L 3.6 3.8 3.4(L)  Chloride 98 - 111 mmol/L 105 103 103  CO2 22 - 32 mmol/L 24 28 30(H)  Calcium 8.9 - 10.3 mg/dL 8.7(L) 9.2 9.1  Total Protein 6.5 - 8.1 g/dL 6.1(L) 6.4(L) 5.8(L)  Total Bilirubin 0.3 - 1.2 mg/dL 0.5 0.5 0.6  Alkaline Phos 38 - 126 U/L 90 98 86  AST 15 - 41 U/L 24 19 20   ALT 0 - 44 U/L 15 13 10    Component     Latest Ref Rng & Units 05/02/2017 06/29/2017  HBV DNA SERPL PCR-ACNC     IU/mL 290 HBV DNA not detected  HBV DNA SERPL PCR-LOG IU     log10 IU/mL 2.462 UNABLE TO CALCULATE  Test Info:      Comment Comment   Component     Latest Ref Rng & Units 07/12/2017  HBV DNA SERPL PCR-ACNC     IU/mL HBV DNA not detected  HBV DNA SERPL PCR-LOG IU     log10 IU/mL UNABLE TO CALCULATE  Test Info:  Comment   Component     Latest Ref Rng & Units 05/02/2017  HBV DNA SERPL PCR-ACNC     IU/mL 290  HBV DNA SERPL PCR-LOG IU     log10 IU/mL 2.462  Test Info:      Comment  Hep B Core Ab, IgM     Negative Negative  Hep B Core Ab, Tot     Negative Positive (A)  Hepatitis B Surface Ag     Negative Confirm. indicated  HCV Ab     0.0 - 0.9 s/co ratio <0.1  HBsAg Conf       Positive (A)    PATHOLOGY    Diagnosis 04/25/17  Lymph node, needle/core biopsy, left - FOLLICULAR LYMPHOMA, GRADE 1-2. - SEE ONCOLOGY TABLE. Microscopic Comment LYMPHOMA Histologic type: Non-Hodgkin B-cell lymphoma: Follicular lymphoma. Grade (if applicable): Low grade (grade 1-2). Flow cytometry: N/A. Immunohistochemical stains: CD20, CD3, CD5, CD10, CD23, CD43, bcl-2, bcl-6, CD138, light chains, Ki-67. Touch preps/imprints: N/A. Comments: Core biopsies reveal effacement by a vaguely nodular infiltrate of atypical lymphocytes. The lymphocytes are predominately small with irregular nuclear contours. There are no diffuse areas of large cells. Immunohistochemistry reveals abundant CD20-positive B-cells. CD3, CD43, and CD5 highlight interfollicular T-cells. Bcl-6 and CD23 reveal numerous small follicles with bcl-6 positive cells extending outside the follicular dendritic networks. The majority of these cells are bcl-2 positive. CD10 reveals only a few small residual germinal center foci. CD138 highlights scattered plasma cells which are polytypic by light chain in situ hybridization. The overall findings are most consistent with a low grade follicular lymphoma. Dr. Isaiah Blakes was notified on 04/27/2017.   RADIOGRAPHIC STUDIES: I have personally reviewed the radiological images as listed and agreed with the findings in the report. Xr Humerus Right  Result Date: 08/29/2017 Abundant callus formation of humeral shaft fracture with stable alignment.   Screening Mammogram 04/17/17 IMPRESSION:  The New enlarged nodes in both axillae are indeterminate. An [name] is recommended.    ASSESSMENT & PLAN:   Brooke Huber is a 72 y.o. Caucasian female with   1. 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  2. Mild thrombocytopenia-- likely from lymphoma. ? BM involvement vs Immune thrombocytopenia.+ chemotherapy PLT 63k   3. Chronic active Hepatitis B -Hepatitis B labs from 05/02/17 revealed chronic active disease which will be a limiting factor in her treatment.  -Pt has been started on Tenofovir AF with ID, Dr Linus Salmons on 05/24/17 -Tenofovir has successfully suppressed her Hep B virus as of 06/29/17 and 07/24/2017 labs  PLAN -Offered port placement, pt declined at this time but will let me know if she needs to have this placed -Zofran and Ativan for nausea as needed -Discussed pt labwork today, 09/13/17; PLT down to 69k, blood counts and chemistries are otherwise stable -Discussed that the patient's platelets are prohibitive from receiving Bendamustine today -Will tentatively reschedule Bendamustine in 10-14 days  -Discussed the option to proceed with Rituxan as scheduled today, pt would like to continue with Rituxan infusion today -Will order PET/CT for completion three to four weeks after third Bendamustine infusion -Will see the pt back in 10-14 days with labs and tentative C3 of Bendamustine     Only Rituxan today Cancel Bendamustine  today and tomorrow Please schedule Bendamustine D1 and D2 in 2 weeks with rpt labs and MD visit Please schedule next cycle of BR in 6 weeks from now (cancel currently schedule future BR appointments) with labs and MD visit    All of the patients questions were answered with apparent satisfaction. The patient knows to call the clinic with any problems, questions or concerns.  The total time spent in the appt was 25 minutes and more than 50% was on counseling and direct patient cares.    Sullivan Lone MD MS AAHIVMS [facility] Hematology/Oncology Physician [facility]  (Office):       [phone] (Work cell):   [phone] (Fax):           [phone]  09/13/2017 9:27 AM  I, Baldwin [place], am acting as a scribe for Dr. Irene Limbo  .I have reviewed the above documentation for accuracy and completeness, and I agree with the above. Brunetta Genera MD

## 2017-09-13 ENCOUNTER — Encounter: Payer: Self-pay | Admitting: Hematology

## 2017-09-13 ENCOUNTER — Inpatient Hospital Stay: Payer: PPO

## 2017-09-13 ENCOUNTER — Inpatient Hospital Stay (HOSPITAL_BASED_OUTPATIENT_CLINIC_OR_DEPARTMENT_OTHER): Payer: PPO | Admitting: Hematology

## 2017-09-13 ENCOUNTER — Inpatient Hospital Stay: Payer: PPO | Attending: Hematology

## 2017-09-13 ENCOUNTER — Telehealth: Payer: Self-pay | Admitting: Hematology

## 2017-09-13 ENCOUNTER — Ambulatory Visit (HOSPITAL_BASED_OUTPATIENT_CLINIC_OR_DEPARTMENT_OTHER): Payer: Self-pay | Admitting: Medical

## 2017-09-13 VITALS — BP 135/68 | HR 60 | Temp 98.4°F | Resp 18 | Ht 63.0 in | Wt 170.4 lb

## 2017-09-13 VITALS — BP 132/68 | HR 60 | Temp 98.6°F | Resp 16

## 2017-09-13 DIAGNOSIS — Z5112 Encounter for antineoplastic immunotherapy: Secondary | ICD-10-CM | POA: Insufficient documentation

## 2017-09-13 DIAGNOSIS — D696 Thrombocytopenia, unspecified: Secondary | ICD-10-CM

## 2017-09-13 DIAGNOSIS — Z5111 Encounter for antineoplastic chemotherapy: Secondary | ICD-10-CM | POA: Insufficient documentation

## 2017-09-13 DIAGNOSIS — B372 Candidiasis of skin and nail: Secondary | ICD-10-CM | POA: Insufficient documentation

## 2017-09-13 DIAGNOSIS — C8218 Follicular lymphoma grade II, lymph nodes of multiple sites: Secondary | ICD-10-CM

## 2017-09-13 DIAGNOSIS — C8214 Follicular lymphoma grade II, lymph nodes of axilla and upper limb: Secondary | ICD-10-CM | POA: Insufficient documentation

## 2017-09-13 DIAGNOSIS — B1910 Unspecified viral hepatitis B without hepatic coma: Secondary | ICD-10-CM

## 2017-09-13 DIAGNOSIS — T451X5A Adverse effect of antineoplastic and immunosuppressive drugs, initial encounter: Secondary | ICD-10-CM

## 2017-09-13 DIAGNOSIS — D701 Agranulocytosis secondary to cancer chemotherapy: Secondary | ICD-10-CM

## 2017-09-13 DIAGNOSIS — T8090XA Unspecified complication following infusion and therapeutic injection, initial encounter: Secondary | ICD-10-CM

## 2017-09-13 LAB — CBC WITH DIFFERENTIAL/PLATELET
Basophils Absolute: 0 10*3/uL (ref 0.0–0.1)
Basophils Relative: 1 %
EOS ABS: 0.2 10*3/uL (ref 0.0–0.5)
EOS PCT: 6 %
HCT: 36.2 % (ref 34.8–46.6)
HEMOGLOBIN: 12.1 g/dL (ref 11.6–15.9)
LYMPHS ABS: 0.6 10*3/uL — AB (ref 0.9–3.3)
Lymphocytes Relative: 19 %
MCH: 29.4 pg (ref 25.1–34.0)
MCHC: 33.4 g/dL (ref 31.5–36.0)
MCV: 88.1 fL (ref 79.5–101.0)
MONOS PCT: 17 %
Monocytes Absolute: 0.5 10*3/uL (ref 0.1–0.9)
NEUTROS PCT: 57 %
Neutro Abs: 1.8 10*3/uL (ref 1.5–6.5)
Platelets: 69 10*3/uL — ABNORMAL LOW (ref 145–400)
RBC: 4.11 MIL/uL (ref 3.70–5.45)
RDW: 16.9 % — ABNORMAL HIGH (ref 11.2–14.5)
WBC: 3.1 10*3/uL — ABNORMAL LOW (ref 3.9–10.3)

## 2017-09-13 LAB — CMP (CANCER CENTER ONLY)
ALT: 15 U/L (ref 0–44)
AST: 24 U/L (ref 15–41)
Albumin: 3.5 g/dL (ref 3.5–5.0)
Alkaline Phosphatase: 90 U/L (ref 38–126)
Anion gap: 12 (ref 5–15)
BILIRUBIN TOTAL: 0.5 mg/dL (ref 0.3–1.2)
BUN: 12 mg/dL (ref 8–23)
CO2: 24 mmol/L (ref 22–32)
CREATININE: 0.93 mg/dL (ref 0.44–1.00)
Calcium: 8.7 mg/dL — ABNORMAL LOW (ref 8.9–10.3)
Chloride: 105 mmol/L (ref 98–111)
GFR, Est AFR Am: >60 mL/min (ref >60)
GFR, Estimated: 60 mL/min — ABNORMAL LOW (ref >60)
Glucose, Bld: 96 mg/dL (ref 70–99)
Potassium: 3.6 mmol/L (ref 3.5–5.1)
Sodium: 141 mmol/L (ref 135–145)
TOTAL PROTEIN: 6.1 g/dL — AB (ref 6.5–8.1)

## 2017-09-13 LAB — RETICULOCYTES
RBC.: 4.11 MIL/uL (ref 3.70–5.45)
Retic Count, Absolute: 32.9 10*3/uL — ABNORMAL LOW (ref 33.7–90.7)
Retic Ct Pct: 0.8 % (ref 0.7–2.1)

## 2017-09-13 MED ORDER — ACETAMINOPHEN 325 MG PO TABS
650.0000 mg | ORAL_TABLET | Freq: Once | ORAL | Status: AC
  Administered 2017-09-13: 650 mg via ORAL

## 2017-09-13 MED ORDER — DEXAMETHASONE SODIUM PHOSPHATE 10 MG/ML IJ SOLN
10.0000 mg | Freq: Once | INTRAMUSCULAR | Status: AC
  Administered 2017-09-13: 10 mg via INTRAVENOUS

## 2017-09-13 MED ORDER — ACETAMINOPHEN 325 MG PO TABS
ORAL_TABLET | ORAL | Status: AC
  Filled 2017-09-13: qty 1

## 2017-09-13 MED ORDER — DIPHENHYDRAMINE HCL 25 MG PO CAPS
ORAL_CAPSULE | ORAL | Status: AC
  Filled 2017-09-13: qty 2

## 2017-09-13 MED ORDER — SODIUM CHLORIDE 0.9 % IV SOLN
375.0000 mg/m2 | Freq: Once | INTRAVENOUS | Status: AC
  Administered 2017-09-13: 700 mg via INTRAVENOUS
  Filled 2017-09-13: qty 50

## 2017-09-13 MED ORDER — ACETAMINOPHEN 325 MG PO TABS
ORAL_TABLET | ORAL | Status: AC
  Filled 2017-09-13: qty 2

## 2017-09-13 MED ORDER — DEXAMETHASONE SODIUM PHOSPHATE 10 MG/ML IJ SOLN
INTRAMUSCULAR | Status: AC
  Filled 2017-09-13: qty 1

## 2017-09-13 MED ORDER — DIPHENHYDRAMINE HCL 25 MG PO CAPS
50.0000 mg | ORAL_CAPSULE | Freq: Once | ORAL | Status: AC
  Administered 2017-09-13: 50 mg via ORAL

## 2017-09-13 MED ORDER — SODIUM CHLORIDE 0.9 % IV SOLN
Freq: Once | INTRAVENOUS | Status: AC
  Administered 2017-09-13: 11:00:00 via INTRAVENOUS
  Filled 2017-09-13: qty 250

## 2017-09-13 MED ORDER — ACETAMINOPHEN 325 MG PO TABS
325.0000 mg | ORAL_TABLET | Freq: Once | ORAL | Status: AC
  Administered 2017-09-13: 325 mg via ORAL

## 2017-09-13 MED ORDER — FAMOTIDINE 20 MG PO TABS
40.0000 mg | ORAL_TABLET | Freq: Once | ORAL | Status: AC
Start: 2017-09-13 — End: 2017-09-13
  Administered 2017-09-13: 40 mg via ORAL

## 2017-09-13 MED ORDER — FAMOTIDINE 20 MG PO TABS
ORAL_TABLET | ORAL | Status: AC
  Filled 2017-09-13: qty 2

## 2017-09-13 NOTE — Telephone Encounter (Signed)
Appts scheduled AVS/Calendar printed per 8/14 los °

## 2017-09-13 NOTE — Progress Notes (Signed)
Per Dr. Irene Limbo, ok to treat with platelets of 69 for Rituxan only

## 2017-09-13 NOTE — Patient Instructions (Signed)
[facility] Discharge Instructions for Patients Receiving Chemotherapy  Today you received the following chemotherapy agents:  Rituxan   To help prevent nausea and vomiting after your treatment, we encourage you to take your nausea medication as prescribed.   If you develop nausea and vomiting that is not controlled by your nausea medication, call the clinic.   BELOW ARE SYMPTOMS THAT SHOULD BE REPORTED IMMEDIATELY:  *FEVER GREATER THAN 100.5 F  *CHILLS WITH OR WITHOUT FEVER  NAUSEA AND VOMITING THAT IS NOT CONTROLLED WITH YOUR NAUSEA MEDICATION  *UNUSUAL SHORTNESS OF BREATH  *UNUSUAL BRUISING OR BLEEDING  TENDERNESS IN MOUTH AND THROAT WITH OR WITHOUT PRESENCE OF ULCERS  *URINARY PROBLEMS  *BOWEL PROBLEMS  UNUSUAL RASH Items with * indicate a potential emergency and should be followed up as soon as possible.  Feel free to call the clinic should you have any questions or concerns. The clinic phone number is [phone].  Please show the CHEMO ALERT CARD at check-in to the Emergency Department and triage nurse.   

## 2017-09-13 NOTE — Progress Notes (Signed)
At approx 1354 pt c/o drowsiness, dry mouth and itchy, tingly throat.  Rituxan stopped and Sandi Mealy, PA notified.  Additional Tylenol given.  Pt monitored with VSS.  Symptoms resolved and Rituxan restarted at lower rate.  Will continue to increase rate in 30 minute increments as pt tolerates.

## 2017-09-14 ENCOUNTER — Inpatient Hospital Stay: Payer: PPO

## 2017-09-14 ENCOUNTER — Ambulatory Visit (HOSPITAL_COMMUNITY): Payer: Self-pay | Admitting: Psychiatry

## 2017-09-15 NOTE — Progress Notes (Signed)
    DATE:  09/13/2017                                          X CHEMO/IMMUNOTHERAPY REACTION            MD:  Dr. Sullivan Lone   AGENT/BLOOD [place]:               Rituximab   AGENT/BLOOD PRODUCT RECEIVING IMMEDIATELY PRIOR TO REACTION:           Rituximab   VS: BP:      135/65   P:        62       SPO2:        97% on room air  T: 98.5                  REACTION(S):            Dry mouth and throat tingling   PREMEDS:      Tylenol 650 mg p.o. x1, Benadryl 50 mg p.o. x1, dexamethasone 10 mg IV x1, and Pepcid 40 mg IV x1   INTERVENTION: Tylenol 325 mg p.o. x1   Review of Systems  Review of Systems  Constitutional: Negative for chills, diaphoresis and fever.  HENT: Negative for trouble swallowing and voice change.        Dry mouth and throat tingling  Respiratory: Negative for cough, chest tightness, shortness of breath and wheezing.   Cardiovascular: Negative for chest pain and palpitations.  Gastrointestinal: Negative for abdominal pain, constipation, diarrhea, nausea and vomiting.  Musculoskeletal: Negative for back pain and myalgias.  Neurological: Negative for dizziness, light-headedness and headaches.     Physical Exam  Physical Exam  Constitutional: No distress.  HENT:  Head: Normocephalic and atraumatic.  Cardiovascular: Normal rate, regular rhythm and normal heart sounds. Exam reveals no gallop and no friction rub.  No murmur heard. Pulmonary/Chest: Effort normal and breath sounds normal. No respiratory distress. She has no wheezes. She has no rales.  Neurological: She is alert.  Skin: Skin is warm and dry. No rash noted. She is not diaphoretic. No erythema.    OUTCOME:                 Rituximab was restarted at 300 mg/h with the patient completing her infusion without any additional issues of concern.   Sandi Mealy, MHS, PA-C

## 2017-09-21 ENCOUNTER — Ambulatory Visit (HOSPITAL_COMMUNITY): Payer: Self-pay | Admitting: Psychiatry

## 2017-09-26 NOTE — Progress Notes (Signed)
HEMATOLOGY/ONCOLOGY CLINIC NOTE  Date of Service:  09/27/17    Patient Care Team: Alroy Dust, L.Marlou Sa, MD as PCP - General (Family Medicine)  CHIEF COMPLAINTS/PURPOSE OF CONSULTATION:  F/u for mx of recently diagnosed Follicular Lymphoma  HISTORY OF PRESENTING ILLNESS:    Brooke Huber is a wonderful 72 y.o. female who has been referred to [name] by her PCP, Dr. 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 She presents to the clinic today accompanied by her friend.   She had a yearly screening mammogram on 04/17/17 which found her to have an enlarged axillary lymph nodes. She had a biopsy on 04/28/17 which showed evidence of lymphoma.   Today she notes she feels fine in general with no changes over the last 6 months. She hopes to go to the beach on the 18th of this month with her friend for 5 days.   She notes years ago in the late 1990s she went to donate blood and they told her she had Hep B, but never had liver damage/change or concerning symptoms. She never had been out of the country, tattoo or blood transfusion. She denies any other major comorbidities. She sees a psychiatrist every 4 months for her depression and anxiety. She notes diagnosis like this can trigger her depression but she is working to maintain her mood during this time.   On review of symptoms, pt notes limited ROM of her left arm for the last 2 years with tenderness. She notes arthritis in her hands and possibly in her shoulder. She denies bowel issues or trouble with bowel movements or passing urine.  Interval History:  Ms. 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 The patient's last visit with [name] was on 09/13/17. She is accompanied today by her friend. The pt reports that she is doing well overall.   The pt reports that she developed some "bumps and crust" under both of her arms after shaving with a new blade. She notes that she has had some  fatigue related to the chemotherapy, and denies any concerns for infection.   She continues following up with Dr. Linus Salmons in [place] for management and evaluation of her Hep B and is taking Tenofovir.   Lab results today (09/27/17) of CBC w/diff, CMP is as follows: all values are WNL except for WBC at 3.3k, RDW at 16.6, PLT at 102k, Lymphs abs at 800, Total Protein at 6.2, GFR at 56.  On review of systems, pt reports bilateral armpit rash, some fatigue, eating well, and denies concerns for infection, fevers, chills, night sweats, mouth sores, abdominal pains, and any other symptoms.   MEDICAL HISTORY:  Past Medical History:  Diagnosis Date  . 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 Cancer [facility])    breast lymphoma with mets  . Depression   . GERD (gastroesophageal reflux disease)   . Hepatitis   . Hepatitis B 02/1995  . Seizures ([place])    hx of seizure in 1993 and 1994 - caused by stress per patient   -Diverticulitis  -Osteopenia  -Acute gastritis   SURGICAL HISTORY: Past Surgical History:  Procedure Laterality Date  . APPENDECTOMY    . COLONOSCOPY WITH PROPOFOL N/A 12/29/2015   Procedure: COLONOSCOPY WITH PROPOFOL;  Surgeon: Garlan Fair, MD;  Location: [facility];  Service: Endoscopy;  Laterality: N/A;  . TONSILLECTOMY AND ADENOIDECTOMY Bilateral 1954  . TUBAL LIGATION      SOCIAL  HISTORY: Social History   Socioeconomic History  . Marital status: Single    Spouse name: Not on file  . Number of children: Not on file  . Years of education: Not on file  . Highest education level: Not on file  Occupational History  . Not on file  Social Needs  . Financial resource strain: Not on file  . Food insecurity:    Worry: Not on file    Inability: Not on file  . Transportation needs:    Medical: Not on file    Non-medical: Not on file  Tobacco Use  . Smoking status: Never Smoker  . Smokeless tobacco: Never Used  Substance and Sexual Activity  . Alcohol use: No  . Drug  use: No  . Sexual activity: Never    Birth control/protection: None  Lifestyle  . Physical activity:    Days per week: Not on file    Minutes per session: Not on file  . Stress: Not on file  Relationships  . Social connections:    Talks on phone: Not on file    Gets together: Not on file    Attends religious service: Not on file    Active member of club or organization: Not on file    Attends meetings of clubs or organizations: Not on file    Relationship status: Not on file  . Intimate partner violence:    Fear of current or ex partner: Not on file    Emotionally abused: Not on file    Physically abused: Not on file    Forced sexual activity: Not on file  Other Topics Concern  . Not on file  Social History Narrative  . Not on file    FAMILY HISTORY: Family History  Problem Relation Age of Onset  . Alzheimer's disease Sister   . Dementia Sister   . Sexual abuse Sister   . Seizures Sister   . Alcohol abuse Brother   . Alcohol abuse Maternal Uncle   . Drug abuse Cousin   . Drug abuse Son   . Depression Neg Hx     ALLERGIES:  is allergic to penicillins.  MEDICATIONS:  Current Outpatient Medications  Medication Sig Dispense Refill  . acyclovir (ZOVIRAX) 400 MG tablet Take 1 tablet (400 mg total) by mouth daily. 30 tablet 3  . allopurinol (ZYLOPRIM) 300 MG tablet Take 0.5 tablets (150 mg total) by mouth daily. 30 tablet 0  . Calcium Carb-Cholecalciferol 600-100 MG-UNIT CAPS Take 1 tablet by mouth daily.    . calcium-vitamin D (OSCAL WITH D) 500-200 MG-UNIT tablet Take 1 tablet by mouth 3 (three) times daily. 90 tablet 12  . dexamethasone (DECADRON) 4 MG tablet Take 2 tablets (8 mg total) by mouth daily. Start the day after bendamustine chemotherapy for 2 days. Take with food. 30 tablet 1  . HYDROcodone-acetaminophen (NORCO/VICODIN) 5-325 MG tablet TK 1 T PO  Q 4 H PRN P  0  . LORazepam (ATIVAN) 0.5 MG tablet Take 1 tablet (0.5 mg total) by mouth every 6 (six) hours as  needed (Nausea or vomiting). 30 tablet 0  . ondansetron (ZOFRAN) 8 MG tablet Take 1 tablet (8 mg total) by mouth 2 (two) times daily as needed for refractory nausea / vomiting. Start on day 2 after bendamustine chemo. 30 tablet 1  . prochlorperazine (COMPAZINE) 10 MG tablet Take 1 tablet (10 mg total) by mouth every 6 (six) hours as needed (Nausea or vomiting). 30 tablet 1  . risperiDONE (  RISPERDAL) 0.5 MG tablet Take 1 tablet (0.5 mg total) by mouth at bedtime. 90 tablet 0  . sertraline (ZOLOFT) 50 MG tablet Take 1 tablet (50 mg total) by mouth daily. 90 tablet 0  . Tenofovir Alafenamide Fumarate 25 MG TABS Take 1 tablet (25 mg total) by mouth daily. 30 tablet 5  . vitamin C (ASCORBIC ACID) 500 MG tablet Take 500 mg by mouth daily.     No current facility-administered medications for this visit.     REVIEW OF SYSTEMS:   A 10+ POINT REVIEW OF SYSTEMS WAS OBTAINED including neurology, dermatology, psychiatry, cardiac, respiratory, lymph, extremities, GI, GU, Musculoskeletal, constitutional, breasts, reproductive, HEENT.  All pertinent positives are noted in the HPI.  All others are negative.   PHYSICAL EXAMINATION: ECOG PERFORMANCE STATUS: 1 - Symptomatic but completely ambulatory  Vitals:   09/27/17 0853  BP: 129/73  Pulse: 67  Resp: 18  Temp: 98.4 F (36.9 C)  SpO2: 98%   There were no vitals filed for this visit. .There is no height or weight on file to calculate BMI.  GENERAL:alert, in no acute distress and comfortable SKIN:  Maceration and local irritation and possible fungal infection under b/l armpits EYES: conjunctiva are pink and non-injected, sclera anicteric OROPHARYNX: MMM, no exudates, no oropharyngeal erythema or ulceration NECK: supple, no JVD LYMPH:  no palpable lymphadenopathy in the inguinal  (+) small palpable LN in the upper right neck and middle right neck, very small palpable LN in the right axilla LUNGS: clear to auscultation b/l with normal respiratory  effort HEART: regular rate & rhythm ABDOMEN:  normoactive bowel sounds , non tender, not distended. No palpable hepatosplenomegaly.  Extremity: no pedal edema PSYCH: alert & oriented x 3 with fluent speech NEURO: no focal motor/sensory deficits    LABORATORY DATA:  I have reviewed the data as listed  . CBC Latest Ref Rng & Units 09/27/2017 09/13/2017 08/17/2017  WBC 3.9 - 10.3 K/uL 3.3(L) 3.1(L) 2.6(L)  Hemoglobin 11.6 - 15.9 g/dL 12.8 12.1 12.2  Hematocrit 34.8 - 46.6 % 37.8 36.2 36.1  Platelets 145 - 400 K/uL 102(L) 69(L) 135(L)   ANC 1800 . CMP Latest Ref Rng & Units 09/27/2017 09/13/2017 08/09/2017  Glucose 70 - 99 mg/dL 89 96 94  BUN 8 - 23 mg/dL 10 12 13   Creatinine 0.44 - 1.00 mg/dL 0.98 0.93 0.90  Sodium 135 - 145 mmol/L 140 141 139  Potassium 3.5 - 5.1 mmol/L 3.9 3.6 3.8  Chloride 98 - 111 mmol/L 103 105 103  CO2 22 - 32 mmol/L 27 24 28   Calcium 8.9 - 10.3 mg/dL 9.4 8.7(L) 9.2  Total Protein 6.5 - 8.1 g/dL 6.2(L) 6.1(L) 6.4(L)  Total Bilirubin 0.3 - 1.2 mg/dL 0.5 0.5 0.5  Alkaline Phos 38 - 126 U/L 79 90 98  AST 15 - 41 U/L 19 24 19   ALT 0 - 44 U/L 12 15 13    Component     Latest Ref Rng & Units 05/02/2017 06/29/2017  HBV DNA SERPL PCR-ACNC     IU/mL 290 HBV DNA not detected  HBV DNA SERPL PCR-LOG IU     log10 IU/mL 2.462 UNABLE TO CALCULATE  Test Info:      Comment Comment   Component     Latest Ref Rng & Units 07/12/2017  HBV DNA SERPL PCR-ACNC     IU/mL HBV DNA not detected  HBV DNA SERPL PCR-LOG IU     log10 IU/mL UNABLE TO CALCULATE  Test Info:  Comment   Component     Latest Ref Rng & Units 05/02/2017  HBV DNA SERPL PCR-ACNC     IU/mL 290  HBV DNA SERPL PCR-LOG IU     log10 IU/mL 2.462  Test Info:      Comment  Hep B Core Ab, IgM     Negative Negative  Hep B Core Ab, Tot     Negative Positive (A)  Hepatitis B Surface Ag     Negative Confirm. indicated  HCV Ab     0.0 - 0.9 s/co ratio <0.1  HBsAg Conf      Positive (A)    PATHOLOGY     Diagnosis 04/25/17  Lymph node, needle/core biopsy, left - FOLLICULAR LYMPHOMA, GRADE 1-2. - SEE ONCOLOGY TABLE. Microscopic Comment LYMPHOMA Histologic type: Non-Hodgkin B-cell lymphoma: Follicular lymphoma. Grade (if applicable): Low grade (grade 1-2). Flow cytometry: N/A. Immunohistochemical stains: CD20, CD3, CD5, CD10, CD23, CD43, bcl-2, bcl-6, CD138, light chains, Ki-67. Touch preps/imprints: N/A. Comments: Core biopsies reveal effacement by a vaguely nodular infiltrate of atypical lymphocytes. The lymphocytes are predominately small with irregular nuclear contours. There are no diffuse areas of large cells. Immunohistochemistry reveals abundant CD20-positive B-cells. CD3, CD43, and CD5 highlight interfollicular T-cells. Bcl-6 and CD23 reveal numerous small follicles with bcl-6 positive cells extending outside the follicular dendritic networks. The majority of these cells are bcl-2 positive. CD10 reveals only a few small residual germinal center foci. CD138 highlights scattered plasma cells which are polytypic by light chain in situ hybridization. The overall findings are most consistent with a low grade follicular lymphoma. Dr. Isaiah Blakes was notified on 04/27/2017.   RADIOGRAPHIC STUDIES: I have personally reviewed the radiological images as listed and agreed with the findings in the report. Xr Humerus Right  Result Date: 08/29/2017 Abundant callus formation of humeral shaft fracture with stable alignment.   Screening Mammogram 04/17/17 IMPRESSION:  The New enlarged nodes in both axillae are indeterminate. An [name] is recommended.    ASSESSMENT & PLAN:   Brooke Huber is a 72 y.o. Caucasian female with   1. 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  2. Mild thrombocytopenia-- likely from lymphoma. ? BM involvement vs Immune thrombocytopenia.+ chemotherapy PLT improved to 102k   3. Chronic active Hepatitis B -Hepatitis B labs from 05/02/17 revealed chronic active disease which will be a limiting factor in her treatment.  -Pt has been started on Tenofovir AF with [place], Dr Linus Salmons on 05/24/17 -Tenofovir has successfully suppressed her Hep B virus as of 06/29/17 and 07/24/2017 labs  PLAN -Offered port placement, pt declined at this time but will let me know if she needs to have this placed -Zofran and Ativan for nausea as needed -Discussed pt labwork today, 09/27/17; PLT increased to 102k, blood counts and chemistries are otherwise stable -Ordering Clotrimazole for suspected fungal infection under both arms -Hep B currently suppressed with Tenofovir, and do not anticipate recommending maintenance Rituxan for the pt after completion of induction therapy  -The pt has no prohibitive toxicities from continuing C3 of Bendamustine and fifth infusion of Rituxan at this time.   -Will repeat PET scan in 3 weeks -Will see the pt back in 4 weeks    PET/CT in 3  weeks Plz schedule next cycle of BR chemotherapy in 4 weeks with labs and MD visit    All of the patients questions were answered with apparent satisfaction. The patient knows to call the clinic with any problems, questions or concerns.  The total time spent in the appt was 25 minutes and more than 50% was on counseling and direct patient cares.     Sullivan Lone MD MS AAHIVMS [facility] Hematology/Oncology Physician [facility]  (Office):       [phone] (Work cell):  [phone] (Fax):           [phone]  09/27/2017 9:25 AM  I, Baldwin [place], am acting as a scribe for Dr. Irene Limbo  .I have reviewed the above documentation for accuracy and completeness, and I agree with  the above. Brunetta Genera MD

## 2017-09-27 ENCOUNTER — Inpatient Hospital Stay: Payer: PPO

## 2017-09-27 ENCOUNTER — Encounter: Payer: Self-pay | Admitting: Hematology

## 2017-09-27 ENCOUNTER — Telehealth: Payer: Self-pay | Admitting: Hematology

## 2017-09-27 ENCOUNTER — Inpatient Hospital Stay (HOSPITAL_BASED_OUTPATIENT_CLINIC_OR_DEPARTMENT_OTHER): Payer: PPO | Admitting: Hematology

## 2017-09-27 VITALS — BP 129/73 | HR 67 | Temp 98.4°F | Resp 18

## 2017-09-27 DIAGNOSIS — Z5112 Encounter for antineoplastic immunotherapy: Secondary | ICD-10-CM | POA: Diagnosis not present

## 2017-09-27 DIAGNOSIS — C8214 Follicular lymphoma grade II, lymph nodes of axilla and upper limb: Secondary | ICD-10-CM | POA: Diagnosis not present

## 2017-09-27 DIAGNOSIS — B372 Candidiasis of skin and nail: Secondary | ICD-10-CM

## 2017-09-27 DIAGNOSIS — B1910 Unspecified viral hepatitis B without hepatic coma: Secondary | ICD-10-CM

## 2017-09-27 DIAGNOSIS — C8218 Follicular lymphoma grade II, lymph nodes of multiple sites: Secondary | ICD-10-CM

## 2017-09-27 DIAGNOSIS — D696 Thrombocytopenia, unspecified: Secondary | ICD-10-CM

## 2017-09-27 LAB — CBC WITH DIFFERENTIAL/PLATELET
BASOS ABS: 0 10*3/uL (ref 0.0–0.1)
BASOS PCT: 0 %
Eosinophils Absolute: 0.1 10*3/uL (ref 0.0–0.5)
Eosinophils Relative: 4 %
HEMATOCRIT: 37.8 % (ref 34.8–46.6)
Hemoglobin: 12.8 g/dL (ref 11.6–15.9)
Lymphocytes Relative: 26 %
Lymphs Abs: 0.8 10*3/uL — ABNORMAL LOW (ref 0.9–3.3)
MCH: 29.8 pg (ref 25.1–34.0)
MCHC: 33.9 g/dL (ref 31.5–36.0)
MCV: 87.9 fL (ref 79.5–101.0)
MONO ABS: 0.3 10*3/uL (ref 0.1–0.9)
Monocytes Relative: 10 %
NEUTROS ABS: 2 10*3/uL (ref 1.5–6.5)
NEUTROS PCT: 60 %
Platelets: 102 10*3/uL — ABNORMAL LOW (ref 145–400)
RBC: 4.3 MIL/uL (ref 3.70–5.45)
RDW: 16.6 % — AB (ref 11.2–14.5)
WBC: 3.3 10*3/uL — ABNORMAL LOW (ref 3.9–10.3)

## 2017-09-27 LAB — CMP (CANCER CENTER ONLY)
ALK PHOS: 79 U/L (ref 38–126)
ALT: 12 U/L (ref 0–44)
ANION GAP: 10 (ref 5–15)
AST: 19 U/L (ref 15–41)
Albumin: 3.5 g/dL (ref 3.5–5.0)
BILIRUBIN TOTAL: 0.5 mg/dL (ref 0.3–1.2)
BUN: 10 mg/dL (ref 8–23)
CALCIUM: 9.4 mg/dL (ref 8.9–10.3)
CO2: 27 mmol/L (ref 22–32)
Chloride: 103 mmol/L (ref 98–111)
Creatinine: 0.98 mg/dL (ref 0.44–1.00)
GFR, Estimated: 56 mL/min — ABNORMAL LOW (ref >60)
GLUCOSE: 89 mg/dL (ref 70–99)
Potassium: 3.9 mmol/L (ref 3.5–5.1)
Sodium: 140 mmol/L (ref 135–145)
Total Protein: 6.2 g/dL — ABNORMAL LOW (ref 6.5–8.1)

## 2017-09-27 MED ORDER — FAMOTIDINE 20 MG PO TABS
40.0000 mg | ORAL_TABLET | Freq: Once | ORAL | Status: AC
  Administered 2017-09-27: 40 mg via ORAL

## 2017-09-27 MED ORDER — ACETAMINOPHEN 325 MG PO TABS
ORAL_TABLET | ORAL | Status: AC
  Filled 2017-09-27: qty 2

## 2017-09-27 MED ORDER — SODIUM CHLORIDE 0.9 % IV SOLN
Freq: Once | INTRAVENOUS | Status: AC
  Administered 2017-09-27: 11:00:00 via INTRAVENOUS
  Filled 2017-09-27: qty 250

## 2017-09-27 MED ORDER — DIPHENHYDRAMINE HCL 25 MG PO CAPS
ORAL_CAPSULE | ORAL | Status: AC
  Filled 2017-09-27: qty 2

## 2017-09-27 MED ORDER — DIPHENHYDRAMINE HCL 25 MG PO CAPS
50.0000 mg | ORAL_CAPSULE | Freq: Once | ORAL | Status: AC
  Administered 2017-09-27: 50 mg via ORAL

## 2017-09-27 MED ORDER — PALONOSETRON HCL INJECTION 0.25 MG/5ML
INTRAVENOUS | Status: AC
  Filled 2017-09-27: qty 5

## 2017-09-27 MED ORDER — PALONOSETRON HCL INJECTION 0.25 MG/5ML
0.2500 mg | Freq: Once | INTRAVENOUS | Status: AC
  Administered 2017-09-27: 0.25 mg via INTRAVENOUS

## 2017-09-27 MED ORDER — FAMOTIDINE 20 MG PO TABS
ORAL_TABLET | ORAL | Status: AC
  Filled 2017-09-27: qty 2

## 2017-09-27 MED ORDER — SODIUM CHLORIDE 0.9 % IV SOLN
70.0000 mg/m2 | Freq: Once | INTRAVENOUS | Status: AC
  Administered 2017-09-27: 125 mg via INTRAVENOUS
  Filled 2017-09-27: qty 5

## 2017-09-27 MED ORDER — ACETAMINOPHEN 325 MG PO TABS
650.0000 mg | ORAL_TABLET | Freq: Once | ORAL | Status: AC
  Administered 2017-09-27: 650 mg via ORAL

## 2017-09-27 MED ORDER — DEXAMETHASONE SODIUM PHOSPHATE 10 MG/ML IJ SOLN
INTRAMUSCULAR | Status: AC
  Filled 2017-09-27: qty 1

## 2017-09-27 MED ORDER — DEXAMETHASONE SODIUM PHOSPHATE 10 MG/ML IJ SOLN
10.0000 mg | Freq: Once | INTRAMUSCULAR | Status: AC
  Administered 2017-09-27: 10 mg via INTRAVENOUS

## 2017-09-27 NOTE — Telephone Encounter (Signed)
Gave Pt avs and calendar of upcoming appts

## 2017-09-27 NOTE — Patient Instructions (Signed)
[place] Discharge Instructions for Patients Receiving Chemotherapy  Today you received the following chemotherapy agents:  Bendeka (bendamustine)  To help prevent nausea and vomiting after your treatment, we encourage you to take your nausea medication as prescribed    If you develop nausea and vomiting that is not controlled by your nausea medication, call the clinic.   BELOW ARE SYMPTOMS THAT SHOULD BE REPORTED IMMEDIATELY:  *FEVER GREATER THAN 100.5 F  *CHILLS WITH OR WITHOUT FEVER  NAUSEA AND VOMITING THAT IS NOT CONTROLLED WITH YOUR NAUSEA MEDICATION  *UNUSUAL SHORTNESS OF BREATH  *UNUSUAL BRUISING OR BLEEDING  TENDERNESS IN MOUTH AND THROAT WITH OR WITHOUT PRESENCE OF ULCERS  *URINARY PROBLEMS  *BOWEL PROBLEMS  UNUSUAL RASH Items with * indicate a potential emergency and should be followed up as soon as possible.  Feel free to call the clinic should you have any questions or concerns. The clinic phone number is [phone].  Please show the [place] at check-in to the Emergency Department and triage nurse.

## 2017-09-28 ENCOUNTER — Encounter (HOSPITAL_COMMUNITY): Payer: Self-pay | Admitting: Psychiatry

## 2017-09-28 ENCOUNTER — Inpatient Hospital Stay: Payer: PPO

## 2017-09-28 ENCOUNTER — Ambulatory Visit (HOSPITAL_COMMUNITY): Payer: PPO | Admitting: Psychiatry

## 2017-09-28 VITALS — BP 94/68 | HR 59 | Temp 98.2°F | Resp 18

## 2017-09-28 DIAGNOSIS — F410 Panic disorder [episodic paroxysmal anxiety] without agoraphobia: Secondary | ICD-10-CM

## 2017-09-28 DIAGNOSIS — F4310 Post-traumatic stress disorder, unspecified: Secondary | ICD-10-CM | POA: Diagnosis not present

## 2017-09-28 DIAGNOSIS — Z5112 Encounter for antineoplastic immunotherapy: Secondary | ICD-10-CM | POA: Diagnosis not present

## 2017-09-28 DIAGNOSIS — Z813 Family history of other psychoactive substance abuse and dependence: Secondary | ICD-10-CM | POA: Diagnosis not present

## 2017-09-28 DIAGNOSIS — Z811 Family history of alcohol abuse and dependence: Secondary | ICD-10-CM | POA: Diagnosis not present

## 2017-09-28 DIAGNOSIS — F333 Major depressive disorder, recurrent, severe with psychotic symptoms: Secondary | ICD-10-CM | POA: Diagnosis not present

## 2017-09-28 DIAGNOSIS — Z810 Family history of intellectual disabilities: Secondary | ICD-10-CM

## 2017-09-28 DIAGNOSIS — Z818 Family history of other mental and behavioral disorders: Secondary | ICD-10-CM

## 2017-09-28 DIAGNOSIS — C8218 Follicular lymphoma grade II, lymph nodes of multiple sites: Secondary | ICD-10-CM

## 2017-09-28 MED ORDER — RISPERIDONE 0.5 MG PO TABS: 0.5000 mg | ORAL_TABLET | Freq: Every day | ORAL | 0 refills | Status: DC

## 2017-09-28 MED ORDER — FAMOTIDINE 20 MG PO TABS
ORAL_TABLET | ORAL | Status: AC
  Filled 2017-09-28: qty 2

## 2017-09-28 MED ORDER — SODIUM CHLORIDE 0.9 % IV SOLN
70.0000 mg/m2 | Freq: Once | INTRAVENOUS | Status: AC
  Administered 2017-09-28: 125 mg via INTRAVENOUS
  Filled 2017-09-28: qty 5

## 2017-09-28 MED ORDER — FAMOTIDINE IN NACL 20-0.9 MG/50ML-% IV SOLN
INTRAVENOUS | Status: AC
  Filled 2017-09-28: qty 50

## 2017-09-28 MED ORDER — CLOTRIMAZOLE 1 % EX CREA: 1.0000 "application " | TOPICAL_CREAM | Freq: Two times a day (BID) | CUTANEOUS | 1 refills | Status: DC

## 2017-09-28 MED ORDER — SODIUM CHLORIDE 0.9 % IV SOLN
Freq: Once | INTRAVENOUS | Status: AC
  Administered 2017-09-28: 14:00:00 via INTRAVENOUS
  Filled 2017-09-28: qty 250

## 2017-09-28 MED ORDER — FAMOTIDINE 20 MG PO TABS
40.0000 mg | ORAL_TABLET | Freq: Once | ORAL | Status: AC
  Administered 2017-09-28: 40 mg via ORAL

## 2017-09-28 MED ORDER — SERTRALINE HCL 50 MG PO TABS: 50.0000 mg | ORAL_TABLET | Freq: Every day | ORAL | 0 refills | Status: DC

## 2017-09-28 MED ORDER — DEXAMETHASONE SODIUM PHOSPHATE 10 MG/ML IJ SOLN
INTRAMUSCULAR | Status: AC
  Filled 2017-09-28: qty 1

## 2017-09-28 MED ORDER — DEXAMETHASONE SODIUM PHOSPHATE 10 MG/ML IJ SOLN
10.0000 mg | Freq: Once | INTRAMUSCULAR | Status: AC
  Administered 2017-09-28: 10 mg via INTRAVENOUS

## 2017-09-28 NOTE — Patient Instructions (Signed)
[facility] Discharge Instructions for Patients Receiving Chemotherapy  Today you received the following chemotherapy agents Bendeka.  To help prevent nausea and vomiting after your treatment, we encourage you to take your nausea medication as directed.   If you develop nausea and vomiting that is not controlled by your nausea medication, call the clinic.   BELOW ARE SYMPTOMS THAT SHOULD BE REPORTED IMMEDIATELY:  *FEVER GREATER THAN 100.5 F  *CHILLS WITH OR WITHOUT FEVER  NAUSEA AND VOMITING THAT IS NOT CONTROLLED WITH YOUR NAUSEA MEDICATION  *UNUSUAL SHORTNESS OF BREATH  *UNUSUAL BRUISING OR BLEEDING  TENDERNESS IN MOUTH AND THROAT WITH OR WITHOUT PRESENCE OF ULCERS  *URINARY PROBLEMS  *BOWEL PROBLEMS  UNUSUAL RASH Items with * indicate a potential emergency and should be followed up as soon as possible.  Feel free to call the clinic should you have any questions or concerns. The clinic phone number is [phone].  Please show the CHEMO ALERT CARD at check-in to the Emergency Department and triage nurse.   

## 2017-09-28 NOTE — Progress Notes (Signed)
BH MD/PA/NP OP Progress Note  09/28/2017 1:04 PM Brooke Huber  MRN:  [id number]  Chief Complaint:  Chief Complaint    Depression; Follow-up     HPI: Brooke Huber is currently on chemo.  She states that she usually has 2 treatments a week but sometimes her white cell count goes low.  When that happens she must skip 2 weeks.  When this happens it causes her depression and anxiety.  She worries about her health and feels sad that she is dealing with all of this.  Other days she denies anxiety or depression and states that she has a lot of support from family and friends.  Most nights she sleeps okay.  She denies SI/HI.  She states "I put it all in the hands of God."  She continues to be social and is staying with a friend.  She denies any PTSD symptoms.  Brooke Huber states she does not really know what the meds are supposed to be doing and cannot tell if they are helping.  She denies any recent panic attacks   Visit Diagnosis:    ICD-10-CM   1. Severe episode of recurrent major depressive disorder, with psychotic features ([place]) F33.3 risperiDONE (RISPERDAL) 0.5 MG tablet    sertraline (ZOLOFT) 50 MG tablet  2. PTSD (post-traumatic stress disorder) F43.10 risperiDONE (RISPERDAL) 0.5 MG tablet    sertraline (ZOLOFT) 50 MG tablet  3. Panic disorder without agoraphobia F41.0 sertraline (ZOLOFT) 50 MG tablet      Past Psychiatric History:  Anxiety:Yes Bipolar Disorder:No Depression:Yes Mania:No Psychosis:Yes Schizophrenia:No Personality Disorder:No [facility] For depression confusion and auditory hallucinations. Patient was hospitalized 3 others times in 1993, 1994 for suicidal ideation and and 1997 after her divorce. History of Electroconvulsive Shock Therapy:No Prior Suicide Attempts:No    Past Medical History:  Past Medical History:  Diagnosis Date  . Arthritis 02/1995   Diagnosed in hands    . Cancer [facility])    breast lymphoma with mets  . Depression   . GERD (gastroesophageal reflux disease)   . Hepatitis   . Hepatitis B 02/1995  . Seizures ([place])    hx of seizure in 1993 and 1994 - caused by stress per patient     Past Surgical History:  Procedure Laterality Date  . APPENDECTOMY    . COLONOSCOPY WITH PROPOFOL N/A 12/29/2015   Procedure: COLONOSCOPY WITH PROPOFOL;  Surgeon: Garlan Fair, MD;  Location: [facility];  Service: Endoscopy;  Laterality: N/A;  . TONSILLECTOMY AND ADENOIDECTOMY Bilateral 1954  . TUBAL LIGATION      Family Psychiatric History: Family History  Problem Relation Age of Onset  . Alzheimer's disease Sister   . Dementia Sister   . Sexual abuse Sister   . Seizures Sister   . Alcohol abuse Brother   . Alcohol abuse Maternal Uncle   . Drug abuse Cousin   . Drug abuse Son   . Depression Neg Hx     Social History:  Social History   Socioeconomic History  . Marital status: Single    Spouse name: Not on file  . Number of children: Not on file  . Years of education: Not on file  . Highest education level: Not on file  Occupational History  . Not on file  Social Needs  . Financial resource strain: Not on file  . Food insecurity:    Worry: Not on file    Inability: Not on file  .  Transportation needs:    Medical: Not on file    Non-medical: Not on file  Tobacco Use  . Smoking status: Never Smoker  . Smokeless tobacco: Never Used  Substance and Sexual Activity  . Alcohol use: No  . Drug use: No  . Sexual activity: Never    Birth control/protection: None  Lifestyle  . Physical activity:    Days per week: Not on file    Minutes per session: Not on file  . Stress: Not on file  Relationships  . Social connections:    Talks on phone: Not on file    Gets together: Not on file    Attends religious service: Not on file    Active member of club or organization: Not on file    Attends meetings of clubs or organizations: Not on  file    Relationship status: Not on file  Other Topics Concern  . Not on file  Social History Narrative  . Not on file    Allergies:  Allergies  Allergen Reactions  . Penicillins Hives and Rash    Metabolic Disorder Labs: Lab Results  Component Value Date   HGBA1C 5.5 10/06/2016   MPG 108 11/10/2015   MPG 123 06/10/2014   Lab Results  Component Value Date   PROLACTIN 37.0 (H) 10/06/2016   PROLACTIN 22.1 11/10/2015   Lab Results  Component Value Date   CHOL 140 10/06/2016   TRIG 81 10/06/2016   HDL 38 (L) 10/06/2016   CHOLHDL 2.7 06/10/2014   VLDL 14 06/10/2014   LDLCALC 86 10/06/2016   LDLCALC 67 06/10/2014   Lab Results  Component Value Date   TSH 1.350 10/06/2016   TSH 1.42 11/10/2015    Therapeutic Level Labs: No results found for: LITHIUM No results found for: VALPROATE No components found for:  CBMZ  Current Medications: Current Outpatient Medications  Medication Sig Dispense Refill  . acyclovir (ZOVIRAX) 400 MG tablet Take 1 tablet (400 mg total) by mouth daily. 30 tablet 3  . allopurinol (ZYLOPRIM) 300 MG tablet Take 0.5 tablets (150 mg total) by mouth daily. 30 tablet 0  . Calcium Carb-Cholecalciferol 600-100 MG-UNIT CAPS Take 1 tablet by mouth daily.    . calcium-vitamin D (OSCAL WITH D) 500-200 MG-UNIT tablet Take 1 tablet by mouth 3 (three) times daily. 90 tablet 12  . clotrimazole (LOTRIMIN) 1 % cream Apply 1 application topically 2 (two) times daily. 30 g 1  . dexamethasone (DECADRON) 4 MG tablet Take 2 tablets (8 mg total) by mouth daily. Start the day after bendamustine chemotherapy for 2 days. Take with food. 30 tablet 1  . HYDROcodone-acetaminophen (NORCO/VICODIN) 5-325 MG tablet TK 1 T PO  Q 4 H PRN P  0  . LORazepam (ATIVAN) 0.5 MG tablet Take 1 tablet (0.5 mg total) by mouth every 6 (six) hours as needed (Nausea or vomiting). 30 tablet 0  . ondansetron (ZOFRAN) 8 MG tablet Take 1 tablet (8 mg total) by mouth 2 (two) times daily as needed  for refractory nausea / vomiting. Start on day 2 after bendamustine chemo. 30 tablet 1  . prochlorperazine (COMPAZINE) 10 MG tablet Take 1 tablet (10 mg total) by mouth every 6 (six) hours as needed (Nausea or vomiting). 30 tablet 1  . risperiDONE (RISPERDAL) 0.5 MG tablet Take 1 tablet (0.5 mg total) by mouth at bedtime. 90 tablet 0  . sertraline (ZOLOFT) 50 MG tablet Take 1 tablet (50 mg total) by mouth daily. 90 tablet 0  .  Tenofovir Alafenamide Fumarate 25 MG TABS Take 1 tablet (25 mg total) by mouth daily. 30 tablet 5  . vitamin C (ASCORBIC ACID) 500 MG tablet Take 500 mg by mouth daily.     No current facility-administered medications for this visit.      Musculoskeletal: Strength & Muscle Tone: within normal limits Gait & Station: normal Patient leans: N/A  Psychiatric Specialty Exam: Review of Systems  Gastrointestinal: Negative for abdominal pain, constipation, diarrhea, nausea and vomiting.  Musculoskeletal: Negative for back pain, joint pain, myalgias and neck pain.    Blood pressure 126/79, pulse (!) 55, height 5\' 3"  (1.6 m), weight 166 lb (75.3 kg), SpO2 99 %.Body mass index is 29.41 kg/m.  General Appearance: Casual  Eye Contact:  Fair  Speech:  Clear and Coherent and Slow  Volume:  Normal  Mood:  Euthymic  Affect:  Blunt  Thought Process:  Coherent and Descriptions of Associations: Intact  Orientation:  Full (Time, Place, and Person)  Thought Content:  Logical  Suicidal Thoughts:  No  Homicidal Thoughts:  No  Memory:  Immediate;   Fair Recent;   Fair Remote;   Fair  Judgement:  Intact  Insight:  Present  Psychomotor Activity:  Decreased  Concentration:  Concentration: Poor and Attention Span: Poor  Recall:  [place] of Knowledge:  Good  Language:  Good  Akathisia:  No  Handed:  Right  AIMS (if indicated):     Assets:  Desire for Improvement Housing Social Support Transportation  ADL's:  Intact  Cognition:  WNL  Sleep:   good      Screenings: AIMS     Office Visit from 10/06/2016 in [facility] Admission (Discharged) from 06/08/2014 in [facility]  AIMS Total Score  0  0    AUDIT     Admission (Discharged) from 06/08/2014 in [facility]  Alcohol Use Disorder Identification Test Final Score (AUDIT)  0    PHQ2-9     Office Visit from 07/17/2017 in [facility] Office Visit from 05/23/2017 in [facility]  PHQ-2 Total Score  0  0      I reviewed the information below on 09/28/2017 and have updated it Assessment and Plan: MDD-severe with psychotic features; PTSD; panic disorder without agoraphobia    Medication management with supportive therapy. Risks and benefits, side effects and alternative treatment options discussed with patient. Pt was given an opportunity to ask questions about medication, illness, and treatment. All current psychiatric medications have been reviewed and discussed with the patient and adjusted as clinically appropriate. The patient has been provided an accurate and updated list of the medications being now prescribed. Patient expressed understanding of how their medications were to be used.  Pt verbalized understanding and verbal consent obtained for treatment.  The risk of un-intended pregnancy is low based on the fact that pt reports she is postmenopausal. Pt is aware that these meds carry a teratogenic risk. Pt will discuss plan of action if she does or plans to become pregnant in the future.  Status of current problems: stable  Meds: Zoloft 50 mg p.o. daily for MDD, PTSD and panic disorder Risperdal 0.5 mg p.o. nightly for psychosis related to MDD Ativan prn prescribed by Dr. Irene Limbo- pt does not think she is taking it now Pt does not want her meds changed at this time  Labs: reviewed labs  done 09/27/17- pt is  working with her oncologist   Therapy: brief supportive therapy provided. Discussed psychosocial stressors in detail.     Consultations: Encouraged to follow up with PCP and oncologist  Pt denies SI and is at an acute low risk for suicide. Patient told to call clinic if any problems occur. Patient advised to go to ER if they should develop SI/HI, side effects, or if symptoms worsen. Has crisis numbers to call if needed. Pt verbalized understanding.  F/up in 3 months or sooner if needed    Charlcie Cradle, MD 09/28/2017, 1:04 PM

## 2017-10-11 ENCOUNTER — Ambulatory Visit: Payer: Self-pay

## 2017-10-11 ENCOUNTER — Other Ambulatory Visit: Payer: Self-pay

## 2017-10-11 ENCOUNTER — Ambulatory Visit: Payer: Self-pay | Admitting: Hematology

## 2017-10-12 ENCOUNTER — Ambulatory Visit: Payer: Self-pay

## 2017-10-17 ENCOUNTER — Other Ambulatory Visit: Payer: Self-pay | Admitting: Internal Medicine

## 2017-10-17 ENCOUNTER — Ambulatory Visit (INDEPENDENT_AMBULATORY_CARE_PROVIDER_SITE_OTHER): Payer: PPO | Admitting: Internal Medicine

## 2017-10-17 ENCOUNTER — Encounter: Payer: Self-pay | Admitting: Internal Medicine

## 2017-10-17 VITALS — BP 120/81 | HR 72 | Temp 98.1°F | Wt 168.4 lb

## 2017-10-17 DIAGNOSIS — B181 Chronic viral hepatitis B without delta-agent: Secondary | ICD-10-CM

## 2017-10-17 DIAGNOSIS — Z7189 Other specified counseling: Secondary | ICD-10-CM | POA: Insufficient documentation

## 2017-10-17 DIAGNOSIS — Z7185 Encounter for immunization safety counseling: Secondary | ICD-10-CM

## 2017-10-17 MED ORDER — TENOFOVIR ALAFENAMIDE FUMARATE 25 MG PO TABS: 25.0000 mg | ORAL_TABLET | Freq: Every day | ORAL | 5 refills | Status: DC

## 2017-10-17 NOTE — Progress Notes (Signed)
   Subjective:    Patient ID: Brooke Huber, female    DOB: May 05, 1945, 72 y.o.   MRN: [id number]  HPI Here for follow up of chronic hepatitis B.  She has low level viremia and follicular lymphoma and I started her on TAF prior to starting chemotherapy to prevent exacerbation.  Tolerating tenofovir well and no missed doses. No complaints today.  PET scan tomorrow.  Chemo scheduled through November.     Review of Systems  Constitutional: Negative for fever and unexpected weight change.  Endocrine: Negative for polyuria.       Objective:   Physical Exam  Constitutional: She appears well-developed and well-nourished. No distress.  HENT:  Mouth/Throat: No oropharyngeal exudate.  Cardiovascular: Normal rate, regular rhythm and normal heart sounds.  Pulmonary/Chest: Effort normal and breath sounds normal.  Skin: No rash noted.          Assessment & Plan:

## 2017-10-17 NOTE — Assessment & Plan Note (Signed)
Flu shot offered and refused

## 2017-10-17 NOTE — Assessment & Plan Note (Signed)
Doing well.  DNA today and rtc 3 months to recheck.  LFTs noted recently and remain wnl.

## 2017-10-18 ENCOUNTER — Encounter (HOSPITAL_COMMUNITY)
Admission: RE | Admit: 2017-10-18 | Discharge: 2017-10-18 | Disposition: A | Discharge status: Home / Self Care | Payer: PPO | Source: Ambulatory Visit | Attending: Hematology | Admitting: Hematology

## 2017-10-18 DIAGNOSIS — C8218 Follicular lymphoma grade II, lymph nodes of multiple sites: Secondary | ICD-10-CM | POA: Insufficient documentation

## 2017-10-18 DIAGNOSIS — C8290 Follicular lymphoma, unspecified, unspecified site: Secondary | ICD-10-CM | POA: Diagnosis not present

## 2017-10-18 LAB — GLUCOSE, CAPILLARY: GLUCOSE-CAPILLARY: 92 mg/dL (ref 70–99)

## 2017-10-18 MED ORDER — FLUDEOXYGLUCOSE F - 18 (FDG) INJECTION
8.3000 | Freq: Once | INTRAVENOUS | Status: AC
  Administered 2017-10-18: 8.3 via INTRAVENOUS

## 2017-10-19 LAB — HEPATITIS B DNA, ULTRAQUANTITATIVE, PCR
Hepatitis B DNA (Calc): <1 Log IU/mL
Hepatitis B DNA: <10 IU/mL

## 2017-10-24 NOTE — Progress Notes (Signed)
HEMATOLOGY/ONCOLOGY CLINIC NOTE  Date of Service:  10/25/17    Patient Care Team: Alroy Dust, L.Marlou Sa, MD as PCP - General (Family Medicine)  CHIEF COMPLAINTS/PURPOSE OF CONSULTATION:  F/u for mx of recently diagnosed Follicular Lymphoma  HISTORY OF PRESENTING ILLNESS:    Brooke Huber is a wonderful 72 y.o. female who has been referred to [name] by her PCP, Dr. 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 She presents to the clinic today accompanied by her friend.   She had a yearly screening mammogram on 04/17/17 which found her to have an enlarged axillary lymph nodes. She had a biopsy on 04/28/17 which showed evidence of lymphoma.   Today she notes she feels fine in general with no changes over the last 6 months. She hopes to go to the beach on the 18th of this month with her friend for 5 days.   She notes years ago in the late 1990s she went to donate blood and they told her she had Hep B, but never had liver damage/change or concerning symptoms. She never had been out of the country, tattoo or blood transfusion. She denies any other major comorbidities. She sees a psychiatrist every 4 months for her depression and anxiety. She notes diagnosis like this can trigger her depression but she is working to maintain her mood during this time.   On review of symptoms, pt notes limited ROM of her left arm for the last 2 years with tenderness. She notes arthritis in her hands and possibly in her shoulder. She denies bowel issues or trouble with bowel movements or passing urine.  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 The patient's last visit with [name] was on 09/27/17. She is accompanied today by her friend. The pt reports that she is doing well overall.   The pt reports that she has not developed any new concerns in the short interim. She denies any constitutional symptoms and denies  any concerns for infections. She also endorses stable energy levels.   The pt notes that her chronic Hep B is currently controlled well with Tenofovir and has maintained follow up with Dr. Linus Salmons in [place].   Of note since the patient's last visit, pt has had a PET/CT completed on 10/18/17 with results revealing No measurable metabolic activity within previously enlarged cervical, axillary, mediastinal, and abdominal and pelvic lymph nodes ( [place] 1). 2. Findings support complete response to therapy. 3. Spleen is normal size with normal metabolic activity. 4. No evidence of marrow involvement.  Lab results today (10/25/17) of CBC w/diff, CMP is as follows: all values are WNL except for WBC at 1.1k, RBC at 3.45, HGB at 10.5, HCT at 30.3, RDW at 14.8, PLT at 42k, ANC at 200, Lymphs abs at 500, Potassium at 3.4, Total Protein at 6.0, Albumin at 3.3. 10/25/17 LDH is . Lab Results  Component Value Date   LDH 161 10/25/2017   On review of systems, pt reports stable energy levels, and denies fevers, chills, night sweats, mouth sores, diarrhea, abdominal pains, bone pains, leg swelling, arm pain, shoulder pain, and any other symptoms.   MEDICAL HISTORY:  Past Medical History:  Diagnosis Date  . 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 Cancer [facility])    breast lymphoma with mets  . Depression   . GERD (gastroesophageal reflux disease)   . Hepatitis   . Hepatitis B 02/1995  .  Seizures ([place])    hx of seizure in 1993 and 1994 - caused by stress per patient   -Diverticulitis  -Osteopenia  -Acute gastritis   SURGICAL HISTORY: Past Surgical History:  Procedure Laterality Date  . APPENDECTOMY    . COLONOSCOPY WITH PROPOFOL N/A 12/29/2015   Procedure: COLONOSCOPY WITH PROPOFOL;  Surgeon: Garlan Fair, MD;  Location: [facility];  Service: Endoscopy;  Laterality: N/A;  . TONSILLECTOMY AND ADENOIDECTOMY Bilateral 1954  . TUBAL LIGATION      SOCIAL HISTORY: Social History   Socioeconomic  History  . Marital status: Single    Spouse name: Not on file  . Number of children: Not on file  . Years of education: Not on file  . Highest education level: Not on file  Occupational History  . Not on file  Social Needs  . Financial resource strain: Not on file  . Food insecurity:    Worry: Not on file    Inability: Not on file  . Transportation needs:    Medical: Not on file    Non-medical: Not on file  Tobacco Use  . Smoking status: Never Smoker  . Smokeless tobacco: Never Used  Substance and Sexual Activity  . Alcohol use: No  . Drug use: No  . Sexual activity: Never    Birth control/protection: None  Lifestyle  . Physical activity:    Days per week: Not on file    Minutes per session: Not on file  . Stress: Not on file  Relationships  . Social connections:    Talks on phone: Not on file    Gets together: Not on file    Attends religious service: Not on file    Active member of club or organization: Not on file    Attends meetings of clubs or organizations: Not on file    Relationship status: Not on file  . Intimate partner violence:    Fear of current or ex partner: Not on file    Emotionally abused: Not on file    Physically abused: Not on file    Forced sexual activity: Not on file  Other Topics Concern  . Not on file  Social History Narrative  . Not on file    FAMILY HISTORY: Family History  Problem Relation Age of Onset  . Alzheimer's disease Sister   . Dementia Sister   . Sexual abuse Sister   . Seizures Sister   . Alcohol abuse Brother   . Alcohol abuse Maternal Uncle   . Drug abuse Cousin   . Drug abuse Son   . Depression Neg Hx     ALLERGIES:  is allergic to penicillins.  MEDICATIONS:  Current Outpatient Medications  Medication Sig Dispense Refill  . acyclovir (ZOVIRAX) 400 MG tablet Take 1 tablet (400 mg total) by mouth daily. 30 tablet 3  . Calcium Carb-Cholecalciferol 600-100 MG-UNIT CAPS Take 1 tablet by mouth daily.    .  calcium-vitamin D (OSCAL WITH D) 500-200 MG-UNIT tablet Take 1 tablet by mouth 3 (three) times daily. 90 tablet 12  . clotrimazole (LOTRIMIN) 1 % cream Apply 1 application topically 2 (two) times daily. 30 g 1  . dexamethasone (DECADRON) 4 MG tablet Take 2 tablets (8 mg total) by mouth daily. Start the day after bendamustine chemotherapy for 2 days. Take with food. 30 tablet 1  . HYDROcodone-acetaminophen (NORCO/VICODIN) 5-325 MG tablet TK 1 T PO  Q 4 H PRN P  0  . LORazepam (ATIVAN) 0.5 MG  tablet Take 1 tablet (0.5 mg total) by mouth every 6 (six) hours as needed (Nausea or vomiting). 30 tablet 0  . ondansetron (ZOFRAN) 8 MG tablet Take 1 tablet (8 mg total) by mouth 2 (two) times daily as needed for refractory nausea / vomiting. Start on day 2 after bendamustine chemo. 30 tablet 1  . prochlorperazine (COMPAZINE) 10 MG tablet Take 1 tablet (10 mg total) by mouth every 6 (six) hours as needed (Nausea or vomiting). 30 tablet 1  . risperiDONE (RISPERDAL) 0.5 MG tablet Take 1 tablet (0.5 mg total) by mouth at bedtime. 90 tablet 0  . sertraline (ZOLOFT) 50 MG tablet Take 1 tablet (50 mg total) by mouth daily. 90 tablet 0  . Tenofovir Alafenamide Fumarate 25 MG TABS Take 1 tablet (25 mg total) by mouth daily. 30 tablet 5  . vitamin C (ASCORBIC ACID) 500 MG tablet Take 500 mg by mouth daily.    Marland Kitchen allopurinol (ZYLOPRIM) 300 MG tablet Take 0.5 tablets (150 mg total) by mouth daily. (Patient not taking: Reported on 10/25/2017) 30 tablet 0   No current facility-administered medications for this visit.     REVIEW OF SYSTEMS:    A 10+ POINT REVIEW OF SYSTEMS WAS OBTAINED including neurology, dermatology, psychiatry, cardiac, respiratory, lymph, extremities, GI, GU, Musculoskeletal, constitutional, breasts, reproductive, HEENT.  All pertinent positives are noted in the HPI.  All others are negative.   PHYSICAL EXAMINATION: ECOG PERFORMANCE STATUS: 1 - Symptomatic but completely ambulatory  Vitals:    10/25/17 0830  BP: 121/60  Pulse: 62  Resp: 14  Temp: 98.4 F (36.9 C)  SpO2: 98%   Filed Weights   10/25/17 0830  Weight: 169 lb 6.4 oz (76.8 kg)   .Body mass index is 30.01 kg/m.  GENERAL:alert, in no acute distress and comfortable SKIN: no acute rashes, no significant lesions EYES: conjunctiva are pink and non-injected, sclera anicteric OROPHARYNX: MMM, no exudates, no oropharyngeal erythema or ulceration NECK: supple, no JVD LYMPH:  no palpable lymphadenopathy in the inguinal region (+) small palpable LN in the upper right neck and middle right neck, very small palpable LN in the right axilla LUNGS: clear to auscultation b/l with normal respiratory effort HEART: regular rate & rhythm ABDOMEN:  normoactive bowel sounds , non tender, not distended. No palpable hepatosplenomegaly.  Extremity: no pedal edema PSYCH: alert & oriented x 3 with fluent speech NEURO: no focal motor/sensory deficits   LABORATORY DATA:  I have reviewed the data as listed  . CBC Latest Ref Rng & Units 10/25/2017 09/27/2017 09/13/2017  WBC 3.9 - 10.3 K/uL 1.1(L) 3.3(L) 3.1(L)  Hemoglobin 11.6 - 15.9 g/dL 10.5(L) 12.8 12.1  Hematocrit 34.8 - 46.6 % 30.3(L) 37.8 36.2  Platelets 145 - 400 K/uL 42(L) 102(L) 69(L)   ANC 200 . CMP Latest Ref Rng & Units 10/25/2017 09/27/2017 09/13/2017  Glucose 70 - 99 mg/dL 91 89 96  BUN 8 - 23 mg/dL 13 10 12   Creatinine 0.44 - 1.00 mg/dL 0.88 0.98 0.93  Sodium 135 - 145 mmol/L 141 140 141  Potassium 3.5 - 5.1 mmol/L 3.4(L) 3.9 3.6  Chloride 98 - 111 mmol/L 105 103 105  CO2 22 - 32 mmol/L 28 27 24   Calcium 8.9 - 10.3 mg/dL 9.0 9.4 8.7(L)  Total Protein 6.5 - 8.1 g/dL 6.0(L) 6.2(L) 6.1(L)  Total Bilirubin 0.3 - 1.2 mg/dL 0.7 0.5 0.5  Alkaline Phos 38 - 126 U/L 75 79 90  AST 15 - 41 U/L 17 19 24  ALT 0 - 44 U/L 12 12 15    Component     Latest Ref Rng & Units 05/02/2017 06/29/2017  HBV DNA SERPL PCR-ACNC     IU/mL 290 HBV DNA not detected  HBV DNA SERPL PCR-LOG IU      log10 IU/mL 2.462 UNABLE TO CALCULATE  Test Info:      Comment Comment   Component     Latest Ref Rng & Units 07/12/2017  HBV DNA SERPL PCR-ACNC     IU/mL HBV DNA not detected  HBV DNA SERPL PCR-LOG IU     log10 IU/mL UNABLE TO CALCULATE  Test Info:      Comment   Component     Latest Ref Rng & Units 05/02/2017  HBV DNA SERPL PCR-ACNC     IU/mL 290  HBV DNA SERPL PCR-LOG IU     log10 IU/mL 2.462  Test Info:      Comment  Hep B Core Ab, IgM     Negative Negative  Hep B Core Ab, Tot     Negative Positive (A)  Hepatitis B Surface Ag     Negative Confirm. indicated  HCV Ab     0.0 - 0.9 s/co ratio <0.1  HBsAg Conf      Positive (A)    PATHOLOGY    Diagnosis 04/25/17  Lymph node, needle/core biopsy, left - FOLLICULAR LYMPHOMA, GRADE 1-2. - SEE ONCOLOGY TABLE. Microscopic Comment LYMPHOMA Histologic type: Non-Hodgkin B-cell lymphoma: Follicular lymphoma. Grade (if applicable): Low grade (grade 1-2). Flow cytometry: N/A. Immunohistochemical stains: CD20, CD3, CD5, CD10, CD23, CD43, bcl-2, bcl-6, CD138, light chains, Ki-67. Touch preps/imprints: N/A. Comments: Core biopsies reveal effacement by a vaguely nodular infiltrate of atypical lymphocytes. The lymphocytes are predominately small with irregular nuclear contours. There are no diffuse areas of large cells. Immunohistochemistry reveals abundant CD20-positive B-cells. CD3, CD43, and CD5 highlight interfollicular T-cells. Bcl-6 and CD23 reveal numerous small follicles with bcl-6 positive cells extending outside the follicular dendritic networks. The majority of these cells are bcl-2 positive. CD10 reveals only a few small residual germinal center foci. CD138 highlights scattered plasma cells which are polytypic by light chain in situ hybridization. The overall findings are most consistent with a low grade follicular lymphoma. Dr. Isaiah Blakes was notified on 04/27/2017.   RADIOGRAPHIC STUDIES: I have personally reviewed the  radiological images as listed and agreed with the findings in the report. Nm Pet Image Restag (ps) Skull Base To Thigh  Result Date: 10/18/2017 CLINICAL DATA:  Subsequent treatment strategy for follicular lymphoma. EXAM: NUCLEAR MEDICINE PET SKULL BASE TO THIGH TECHNIQUE: 8.2 mCi F-18 FDG was injected intravenously. Full-ring PET imaging was performed from the skull base to thigh after the radiotracer. CT data was obtained and used for attenuation correction and anatomic localization. Fasting blood glucose: 92 mg/dl COMPARISON:  None. FINDINGS: Mediastinal blood pool activity: SUV max 1.95 NECK: Resolution of size and metabolic activity of small bilateral hypermetabolic cervical lymph nodes. No residual activity present. Incidental CT findings: none CHEST: Resolution of hypermetabolic axillary lymph node. No lymph nodes remain with measurable metabolic activity. No hypermetabolic mediastinal nodes. Incidental CT findings: Resolution of small RIGHT effusion. No suspicious pulmonary nodules. ABDOMEN/PELVIS: Resolution metabolic activity associated with the retroperitoneal periportal lymph nodes. No measurable activity remains. Interval reduction in size and metabolic activity of the spleen. Spleen is normal volume. No metabolic activity above liver. Incidental CT findings: Several benign cysts within liver. SKELETON: Resolution metabolic activity within the skeleton. No aggressive osseous lesions. Incidental  CT findings: none IMPRESSION: 1. No measurable metabolic activity within previously enlarged cervical, axillary, mediastinal, and abdominal and pelvic lymph nodes ( Deauville 1). 2. Findings support complete response to therapy. 3. Spleen is normal size with normal metabolic activity. 4. No evidence of marrow involvement. Electronically Signed   By: Suzy Bouchard M.D.   On: 10/18/2017 12:11    Screening Mammogram 04/17/17 IMPRESSION:  The New enlarged nodes in both axillae are indeterminate. An [name] is  recommended.    ASSESSMENT & PLAN:   Brooke Huber is a 72 y.o. Caucasian female with   1. 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  2. Mild thrombocytopenia-- likely from lymphoma. ? BM involvement vs Immune thrombocytopenia.+ chemotherapy PLT improved to 42k   3. Chronic active Hepatitis B -Hepatitis B labs from 05/02/17 revealed chronic active disease which will be a limiting factor in her treatment.  -Pt has been started on Tenofovir AF with [place], Dr Linus Salmons on 05/24/17 -Tenofovir has successfully suppressed her Hep B virus as of 06/29/17 and 07/24/2017 labs  PLAN: -Zofran and Ativan for nausea as needed -Discussed pt labwork today, 10/25/17; [place] at 200 -Discussed the 10/18/17 PET/CT which revealed No measurable metabolic activity within previously enlarged cervical, axillary, mediastinal, and abdominal and pelvic lymph nodes ( Deauville 1). 2. Findings support complete response to therapy. 3. Spleen is normal size with normal metabolic activity. 4. No evidence of marrow involvement. -Discussed that the pt has reached complete metabolic response after three cycles of Bendamustine and five cycles of Rituxan  -Discussed that the patient's neutropenia is prohibitive from receiving treatment today  -Discussed that as the pt has Stage IV follicular lymphoma in complete response, and has had several prohibitive neutropenias during treatment, while also taking Tenofovir for her chronic Hep B, I would recommend that the pt not continue with chemotherapy at this time. The pt is agreeable to this.  -Discussed the risks vs benefits of maintenance Rituxan, in the context of her Hep B  treatment with Tenofovir, and my recommendation that the pt not pursue maintenance Rituxan, which the pt is agreeable to.  -Advised that the pt abide by strict infection prevention strategies, avoiding crowds and individuals with infections, and presenting directly to the ED if she develops a fever over the next month  -Will order Neulasta shot today  -Will watch labs again in 3-4 weeks  -Will pursue a wait and watch approach moving forward, which the pt is agreeable to  -Advised salt and baking soda mouthwashes if the pt develops any mouth sores -Will see the pt back in 4 weeks    Holding chemotherapy - cancel all future chemotherapy/injection appointments Neulasta only today RTC with Dr Irene Limbo in 4 weeks with labs     All of the patients questions were answered with apparent satisfaction. The patient knows to call the clinic with any problems, questions or concerns.  The total time spent in the appt was 30 minutes and more than 50% was on counseling and direct patient cares.     Sullivan Lone MD [place] AAHIVMS [facility] Hematology/Oncology Physician [facility]  (Office):       [phone] (Work cell):  [phone] (Fax):  [phone]  10/25/2017 9:18 AM  I, Baldwin [place], am acting as a scribe for Dr. Irene Limbo  .I have reviewed the above documentation for accuracy and completeness, and I agree with the above. Brunetta Genera MD

## 2017-10-25 ENCOUNTER — Telehealth: Payer: Self-pay

## 2017-10-25 ENCOUNTER — Encounter: Payer: Self-pay | Admitting: Hematology

## 2017-10-25 ENCOUNTER — Inpatient Hospital Stay: Payer: PPO

## 2017-10-25 ENCOUNTER — Inpatient Hospital Stay: Payer: PPO | Attending: Hematology

## 2017-10-25 ENCOUNTER — Inpatient Hospital Stay (HOSPITAL_BASED_OUTPATIENT_CLINIC_OR_DEPARTMENT_OTHER): Payer: PPO | Admitting: Hematology

## 2017-10-25 VITALS — BP 121/60 | HR 62 | Temp 98.4°F | Resp 14 | Ht 63.0 in | Wt 169.4 lb

## 2017-10-25 DIAGNOSIS — B181 Chronic viral hepatitis B without delta-agent: Secondary | ICD-10-CM

## 2017-10-25 DIAGNOSIS — M1990 Unspecified osteoarthritis, unspecified site: Secondary | ICD-10-CM | POA: Diagnosis not present

## 2017-10-25 DIAGNOSIS — M8580 Other specified disorders of bone density and structure, unspecified site: Secondary | ICD-10-CM

## 2017-10-25 DIAGNOSIS — T451X5S Adverse effect of antineoplastic and immunosuppressive drugs, sequela: Secondary | ICD-10-CM | POA: Insufficient documentation

## 2017-10-25 DIAGNOSIS — C8214 Follicular lymphoma grade II, lymph nodes of axilla and upper limb: Secondary | ICD-10-CM

## 2017-10-25 DIAGNOSIS — Z79899 Other long term (current) drug therapy: Secondary | ICD-10-CM | POA: Diagnosis not present

## 2017-10-25 DIAGNOSIS — C8218 Follicular lymphoma grade II, lymph nodes of multiple sites: Secondary | ICD-10-CM

## 2017-10-25 DIAGNOSIS — D701 Agranulocytosis secondary to cancer chemotherapy: Secondary | ICD-10-CM | POA: Insufficient documentation

## 2017-10-25 DIAGNOSIS — B1910 Unspecified viral hepatitis B without hepatic coma: Secondary | ICD-10-CM

## 2017-10-25 DIAGNOSIS — F418 Other specified anxiety disorders: Secondary | ICD-10-CM | POA: Diagnosis not present

## 2017-10-25 DIAGNOSIS — T451X5A Adverse effect of antineoplastic and immunosuppressive drugs, initial encounter: Secondary | ICD-10-CM

## 2017-10-25 LAB — CMP (CANCER CENTER ONLY)
ALK PHOS: 75 U/L (ref 38–126)
ALT: 12 U/L (ref 0–44)
AST: 17 U/L (ref 15–41)
Albumin: 3.3 g/dL — ABNORMAL LOW (ref 3.5–5.0)
Anion gap: 8 (ref 5–15)
BILIRUBIN TOTAL: 0.7 mg/dL (ref 0.3–1.2)
BUN: 13 mg/dL (ref 8–23)
CALCIUM: 9 mg/dL (ref 8.9–10.3)
CO2: 28 mmol/L (ref 22–32)
CREATININE: 0.88 mg/dL (ref 0.44–1.00)
Chloride: 105 mmol/L (ref 98–111)
GFR, Estimated: >60 mL/min (ref >60)
GLUCOSE: 91 mg/dL (ref 70–99)
Potassium: 3.4 mmol/L — ABNORMAL LOW (ref 3.5–5.1)
Sodium: 141 mmol/L (ref 135–145)
Total Protein: 6 g/dL — ABNORMAL LOW (ref 6.5–8.1)

## 2017-10-25 LAB — CBC WITH DIFFERENTIAL/PLATELET
Basophils Absolute: 0 10*3/uL (ref 0.0–0.1)
Basophils Relative: 1 %
EOS ABS: 0.2 10*3/uL (ref 0.0–0.5)
Eosinophils Relative: 17 %
HCT: 30.3 % — ABNORMAL LOW (ref 34.8–46.6)
Hemoglobin: 10.5 g/dL — ABNORMAL LOW (ref 11.6–15.9)
Lymphocytes Relative: 45 %
Lymphs Abs: 0.5 10*3/uL — ABNORMAL LOW (ref 0.9–3.3)
MCH: 30.4 pg (ref 25.1–34.0)
MCHC: 34.7 g/dL (ref 31.5–36.0)
MCV: 87.8 fL (ref 79.5–101.0)
MONO ABS: 0.2 10*3/uL (ref 0.1–0.9)
MONOS PCT: 18 %
Neutro Abs: 0.2 10*3/uL — CL (ref 1.5–6.5)
Neutrophils Relative %: 19 %
Platelets: 42 10*3/uL — ABNORMAL LOW (ref 145–400)
RBC: 3.45 MIL/uL — ABNORMAL LOW (ref 3.70–5.45)
RDW: 14.8 % — AB (ref 11.2–14.5)
WBC: 1.1 10*3/uL — ABNORMAL LOW (ref 3.9–10.3)

## 2017-10-25 LAB — LACTATE DEHYDROGENASE: LDH: 161 U/L (ref 98–192)

## 2017-10-25 MED ORDER — PEGFILGRASTIM-CBQV 6 MG/0.6ML ~~LOC~~ SOSY
6.0000 mg | PREFILLED_SYRINGE | Freq: Once | SUBCUTANEOUS | Status: AC
  Administered 2017-10-25: 6 mg via SUBCUTANEOUS

## 2017-10-25 MED ORDER — PEGFILGRASTIM-CBQV 6 MG/0.6ML ~~LOC~~ SOSY
PREFILLED_SYRINGE | SUBCUTANEOUS | Status: AC
  Filled 2017-10-25: qty 0.6

## 2017-10-25 NOTE — Progress Notes (Signed)
Per Dr. Irene Limbo, no treatment today d/t CBC results. Pt neutropenic. Neulasta to be administered today. Pt and infusion nurse made aware.

## 2017-10-25 NOTE — Patient Instructions (Signed)
Pegfilgrastim injection Ellen Henri) What is this medicine? PEGFILGRASTIM (PEG fil gra stim) is a long-acting granulocyte colony-stimulating factor that stimulates the growth of neutrophils, a type of white blood cell important in the body's fight against infection. It is used to reduce the incidence of fever and infection in patients with certain types of cancer who are receiving chemotherapy that affects the bone marrow, and to increase survival after being exposed to high doses of radiation. This medicine may be used for other purposes; ask your health care provider or pharmacist if you have questions. COMMON BRAND NAME(S): Neulasta What should I tell my health care provider before I take this medicine? They need to know if you have any of these conditions: -kidney disease -latex allergy -ongoing radiation therapy -sickle cell disease -skin reactions to acrylic adhesives (On-Body Injector only) -an unusual or allergic reaction to pegfilgrastim, filgrastim, other medicines, foods, dyes, or preservatives -pregnant or trying to get pregnant -breast-feeding How should I use this medicine? This medicine is for injection under the skin. If you get this medicine at home, you will be taught how to prepare and give the pre-filled syringe or how to use the On-body Injector. Refer to the patient Instructions for Use for detailed instructions. Use exactly as directed. Tell your healthcare provider immediately if you suspect that the On-body Injector may not have performed as intended or if you suspect the use of the On-body Injector resulted in a missed or partial dose. It is important that you put your used needles and syringes in a special sharps container. Do not put them in a trash can. If you do not have a sharps container, call your pharmacist or healthcare provider to get one. Talk to your pediatrician regarding the use of this medicine in children. While this drug may be prescribed for selected  conditions, precautions do apply. Overdosage: If you think you have taken too much of this medicine contact a poison control center or emergency room at once. NOTE: This medicine is only for you. Do not share this medicine with others. What if I miss a dose? It is important not to miss your dose. Call your doctor or health care professional if you miss your dose. If you miss a dose due to an On-body Injector failure or leakage, a new dose should be administered as soon as possible using a single prefilled syringe for manual use. What may interact with this medicine? Interactions have not been studied. Give your health care provider a list of all the medicines, herbs, non-prescription drugs, or dietary supplements you use. Also tell them if you smoke, drink alcohol, or use illegal drugs. Some items may interact with your medicine. This list may not describe all possible interactions. Give your health care provider a list of all the medicines, herbs, non-prescription drugs, or dietary supplements you use. Also tell them if you smoke, drink alcohol, or use illegal drugs. Some items may interact with your medicine. What should I watch for while using this medicine? You may need blood work done while you are taking this medicine. If you are going to need a MRI, CT scan, or other procedure, tell your doctor that you are using this medicine (On-Body Injector only). What side effects may I notice from receiving this medicine? Side effects that you should report to your doctor or health care professional as soon as possible: -allergic reactions like skin rash, itching or hives, swelling of the face, lips, or tongue -dizziness -fever -pain, redness, or irritation at  site where injected -pinpoint red spots on the skin -red or dark-brown urine -shortness of breath or breathing problems -stomach or side pain, or pain at the shoulder -swelling -tiredness -trouble passing urine or change in the amount of  urine Side effects that usually do not require medical attention (report to your doctor or health care professional if they continue or are bothersome): -bone pain -muscle pain This list may not describe all possible side effects. Call your doctor for medical advice about side effects. You may report side effects to FDA at 1-800-FDA-1088. Where should I keep my medicine? Keep out of the reach of children. Store pre-filled syringes in a refrigerator between 2 and 8 degrees C (36 and 46 degrees F). Do not freeze. Keep in carton to protect from light. Throw away this medicine if it is left out of the refrigerator for more than 48 hours. Throw away any unused medicine after the expiration date. NOTE: This sheet is a summary. It may not cover all possible information. If you have questions about this medicine, talk to your doctor, pharmacist, or health care provider.  2018 Elsevier/Gold Standard (2016-01-14 12:58:03)    Leukopenia Leukopenia is a condition in which you have a low number of white blood cells. White blood cells help the body to fight infections. The number of white blood cells in the body varies from person to person. There are five types of white blood cells. Two types (lymphocytes and neutrophils) make up most of the white blood cell count. When lymphocytes are low, the condition is called lymphocytopenia. When neutrophils are low, it is called neutropenia. Neutropenia is the most dangerous type of leukopenia because it can lead to dangerous infections. What are the causes? This condition is commonly caused by damage to soft tissue inside of the bones (bone marrow), which is where most white blood cells are made. Bone marrow can get damaged by:  Medicine or X-ray treatments for cancer (chemotherapy or radiation therapy).  Serious infections.  Cancer of the white blood cells (leukemia, lymphoma, or myeloma).  Medicines, including: ? Certain antibiotics. ? Certain heart  medicines. ? Steroids. ? Certain medicines used to treat diseases of the immune system (autoimmune diseases), like rheumatoid arthritis.  Leukopenia also happens when white blood cells are destroyed after leaving the bone marrow, which may result from:  Liver disease.  Autoimmune disease.  Vitamin B deficiencies.  What are the signs or symptoms? One of the most common signs of leukopenia, especially severe neutropenia, is having a lot of bacterial infections. Different infections have different symptoms. An infection in your lungs may cause coughing. A urinary tract infection may cause frequent urination and a burning sensation. You may also get infections of the blood, skin, rectum, throat, sinuses, or ears. Some people have no symptoms. If you do have symptoms, they may include:  Fever.  Fatigue.  Swollen glands (lymph nodes).  Painful mouth ulcers.  Gum disease.  How is this diagnosed? This condition may be diagnosed based on:  Your medical history.  A physical exam to check for swollen lymph nodes and an enlarged spleen. Your spleen is an organ on the left side of your body that stores white blood cells.  Tests, such as: ? A complete blood count. This blood test counts each type of white cell. ? Bone marrow aspiration. Some bone marrow is removed to be checked under a microscope. ? Lymph node biopsy. Some lymph node tissue is removed to be checked under a microscope. ?  Other types of blood tests or imaging tests.  How is this treated? Treatment of leukopenia depends on the cause. Some common treatments include:  Antibiotic medicine to treat bacterial infections.  Stopping medicines that may cause leukopenia.  Medicines to stimulate neutrophil production (hematopoietic growth factors), to treat neutropenia.  Follow these instructions at home:  Take over-the-counter and prescription medicines only as told by your health care provider. This includes supplements and  vitamins.  If you were prescribed an antibiotic medicine, take it as told by your health care provider. Do not stop taking the antibiotic even if you start to feel better.  Preventing infection is important if you have leukopenia. To prevent infection: ? Avoid close contact with sick people. ? Wash your hands frequently with soap and water. If soap and water are not available, use hand sanitizer. ? Do not&nbsp;eat uncooked or undercooked meats. ? Wash fruits and vegetables before eating them. ? Do not eat or drink unpasteurized dairy products. ? Get regular dental care, and maintain good dental hygiene. You should visit the dentist at least once every 6 months.  Keep all follow-up visits as told by your health care provider. This is important. Contact a health care provider if:  You have chills or a fever.  You have symptoms of an infection. Get help right away if:  You have a fever that lasts for more than 2-3 days.  You have symptoms that last for more than 2-3 days.  You have trouble breathing.  You have chest pain. This information is not intended to replace advice given to you by your health care provider. Make sure you discuss any questions you have with your health care provider. Document Released: 01/22/2013 Document Revised: 12/08/2015 Document Reviewed: 12/08/2015 Elsevier Interactive Patient Education  [employer].

## 2017-10-25 NOTE — Telephone Encounter (Signed)
Printed avs and calender of upcoming appointment. Per 9/25 los 

## 2017-10-26 ENCOUNTER — Telehealth: Payer: Self-pay

## 2017-10-26 ENCOUNTER — Inpatient Hospital Stay: Payer: PPO

## 2017-10-26 NOTE — Telephone Encounter (Signed)
Unable to reach pt via home phone. Spoke with best friend, Benjamine Mola, on mobile number provided. Per Dr. Irene Limbo, pt can use sarna cream OTC for itching and swelling of ankles post injection.

## 2017-10-29 DIAGNOSIS — L259 Unspecified contact dermatitis, unspecified cause: Secondary | ICD-10-CM | POA: Diagnosis not present

## 2017-11-22 ENCOUNTER — Inpatient Hospital Stay (HOSPITAL_BASED_OUTPATIENT_CLINIC_OR_DEPARTMENT_OTHER): Payer: PPO | Admitting: Hematology

## 2017-11-22 ENCOUNTER — Inpatient Hospital Stay: Payer: PPO | Attending: Hematology

## 2017-11-22 ENCOUNTER — Encounter: Payer: Self-pay | Admitting: Hematology

## 2017-11-22 ENCOUNTER — Ambulatory Visit: Payer: Self-pay

## 2017-11-22 ENCOUNTER — Telehealth: Payer: Self-pay

## 2017-11-22 ENCOUNTER — Other Ambulatory Visit: Payer: Self-pay

## 2017-11-22 VITALS — BP 131/78 | HR 67 | Resp 18 | Ht 63.0 in | Wt 167.9 lb

## 2017-11-22 DIAGNOSIS — C8218 Follicular lymphoma grade II, lymph nodes of multiple sites: Secondary | ICD-10-CM

## 2017-11-22 DIAGNOSIS — Z880 Allergy status to penicillin: Secondary | ICD-10-CM

## 2017-11-22 DIAGNOSIS — D696 Thrombocytopenia, unspecified: Secondary | ICD-10-CM

## 2017-11-22 DIAGNOSIS — B181 Chronic viral hepatitis B without delta-agent: Secondary | ICD-10-CM | POA: Insufficient documentation

## 2017-11-22 DIAGNOSIS — M1990 Unspecified osteoarthritis, unspecified site: Secondary | ICD-10-CM

## 2017-11-22 DIAGNOSIS — F418 Other specified anxiety disorders: Secondary | ICD-10-CM | POA: Insufficient documentation

## 2017-11-22 DIAGNOSIS — Z79899 Other long term (current) drug therapy: Secondary | ICD-10-CM | POA: Insufficient documentation

## 2017-11-22 DIAGNOSIS — M8580 Other specified disorders of bone density and structure, unspecified site: Secondary | ICD-10-CM | POA: Diagnosis not present

## 2017-11-22 DIAGNOSIS — C8214 Follicular lymphoma grade II, lymph nodes of axilla and upper limb: Secondary | ICD-10-CM

## 2017-11-22 DIAGNOSIS — K219 Gastro-esophageal reflux disease without esophagitis: Secondary | ICD-10-CM | POA: Insufficient documentation

## 2017-11-22 DIAGNOSIS — T451X5A Adverse effect of antineoplastic and immunosuppressive drugs, initial encounter: Secondary | ICD-10-CM

## 2017-11-22 DIAGNOSIS — B1910 Unspecified viral hepatitis B without hepatic coma: Secondary | ICD-10-CM

## 2017-11-22 DIAGNOSIS — D701 Agranulocytosis secondary to cancer chemotherapy: Secondary | ICD-10-CM

## 2017-11-22 LAB — CMP (CANCER CENTER ONLY)
ALBUMIN: 3.5 g/dL (ref 3.5–5.0)
ALT: 17 U/L (ref 0–44)
AST: 25 U/L (ref 15–41)
Alkaline Phosphatase: 80 U/L (ref 38–126)
Anion gap: 9 (ref 5–15)
BILIRUBIN TOTAL: 0.5 mg/dL (ref 0.3–1.2)
BUN: 14 mg/dL (ref 8–23)
CALCIUM: 9.5 mg/dL (ref 8.9–10.3)
CO2: 28 mmol/L (ref 22–32)
CREATININE: 1.12 mg/dL — AB (ref 0.44–1.00)
Chloride: 105 mmol/L (ref 98–111)
GFR, Est AFR Am: 55 mL/min — ABNORMAL LOW (ref >60)
GFR, Estimated: 48 mL/min — ABNORMAL LOW (ref >60)
GLUCOSE: 101 mg/dL — AB (ref 70–99)
Potassium: 3.8 mmol/L (ref 3.5–5.1)
Sodium: 142 mmol/L (ref 135–145)
TOTAL PROTEIN: 6.6 g/dL (ref 6.5–8.1)

## 2017-11-22 LAB — CBC WITH DIFFERENTIAL/PLATELET
ABS IMMATURE GRANULOCYTES: 0.02 10*3/uL (ref 0.00–0.07)
BASOS PCT: 1 %
Basophils Absolute: 0.1 10*3/uL (ref 0.0–0.1)
EOS ABS: 0.2 10*3/uL (ref 0.0–0.5)
Eosinophils Relative: 5 %
HCT: 33.5 % — ABNORMAL LOW (ref 36.0–46.0)
Hemoglobin: 11.2 g/dL — ABNORMAL LOW (ref 12.0–15.0)
Immature Granulocytes: 0 %
Lymphocytes Relative: 13 %
Lymphs Abs: 0.6 10*3/uL — ABNORMAL LOW (ref 0.7–4.0)
MCH: 31.6 pg (ref 26.0–34.0)
MCHC: 33.4 g/dL (ref 30.0–36.0)
MCV: 94.6 fL (ref 80.0–100.0)
MONO ABS: 0.4 10*3/uL (ref 0.1–1.0)
MONOS PCT: 9 %
NEUTROS ABS: 3.2 10*3/uL (ref 1.7–7.7)
Neutrophils Relative %: 72 %
PLATELETS: 123 10*3/uL — AB (ref 150–400)
RBC: 3.54 MIL/uL — ABNORMAL LOW (ref 3.87–5.11)
RDW: 15.9 % — ABNORMAL HIGH (ref 11.5–15.5)
WBC: 4.5 10*3/uL (ref 4.0–10.5)
nRBC: 0 % (ref 0.0–0.2)

## 2017-11-22 LAB — LACTATE DEHYDROGENASE: LDH: 184 U/L (ref 98–192)

## 2017-11-22 NOTE — Progress Notes (Signed)
HEMATOLOGY/ONCOLOGY CLINIC NOTE  Date of Service:  11/22/17    Patient Care Team: Alroy Dust, L.Marlou Sa, MD as PCP - General (Family Medicine)  CHIEF COMPLAINTS/PURPOSE OF CONSULTATION:  F/u for mx of  Follicular Lymphoma  HISTORY OF PRESENTING ILLNESS:    Brooke Huber is a wonderful 72 y.o. female who has been referred to [name] by her PCP, Dr. 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 She presents to the clinic today accompanied by her friend.   She had a yearly screening mammogram on 04/17/17 which found her to have an enlarged axillary lymph nodes. She had a biopsy on 04/28/17 which showed evidence of lymphoma.   Today she notes she feels fine in general with no changes over the last 6 months. She hopes to go to the beach on the 18th of this month with her friend for 5 days.   She notes years ago in the late 1990s she went to donate blood and they told her she had Hep B, but never had liver damage/change or concerning symptoms. She never had been out of the country, tattoo or blood transfusion. She denies any other major comorbidities. She sees a psychiatrist every 4 months for her depression and anxiety. She notes diagnosis like this can trigger her depression but she is working to maintain her mood during this time.   On review of symptoms, pt notes limited ROM of her left arm for the last 2 years with tenderness. She notes arthritis in her hands and possibly in her shoulder. She denies bowel issues or trouble with bowel movements or passing urine.  Interval History:  Brooke Huber returns today regarding her low grade Follicular Lymphoma after completing BR treatment. The patient's last visit with [name] was on 10/25/17. The pt reports that she is doing well overall.   The pt reports that she has not had any new concerns in the interim. She notes that she is planning to receive her annual flu vaccine next week. The pt notes that she has not had any  constitutional symptoms or unexpected weight loss.   Lab results today (11/22/17) of CBC w/diff, CMP, and Reticulocytes is as follows: all values are WNL except for RBC at 3.54, HGB at 11.2, HCT at 33.5, RDW at 15.9, PLT at 123k, Lymphs abs at 600, Glucose at 101, Creatinine at 1.12, GFR at 48. 11/22/17 LDH is 184.  On review of systems, pt reports good energy levels, staying active, eating well, and denies fevers, chills, night sweats, abdominal pains, leg swelling, and any other symptoms.   MEDICAL HISTORY:  Past Medical History:  Diagnosis Date  . 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 Cancer [facility])    breast lymphoma with mets  . Depression   . GERD (gastroesophageal reflux disease)   . Hepatitis   . Hepatitis B 02/1995  . Seizures ([place])    hx of seizure in 1993 and 1994 - caused by stress per patient   -Diverticulitis  -Osteopenia  -Acute gastritis   SURGICAL HISTORY: Past Surgical History:  Procedure Laterality Date  . APPENDECTOMY    . COLONOSCOPY WITH PROPOFOL N/A 12/29/2015   Procedure: COLONOSCOPY WITH PROPOFOL;  Surgeon: Garlan Fair, MD;  Location: [facility] END[facility];  Service: Endoscopy;  Laterality: N/A;  . TONSILLECTOMY AND ADENOIDECTOMY Bilateral 1954  . TUBAL LIGATION      SOCIAL HISTORY: Social History   Socioeconomic History  . Marital status: Single    Spouse  name: Not on file  . Number of children: Not on file  . Years of education: Not on file  . Highest education level: Not on file  Occupational History  . Not on file  Social Needs  . Financial resource strain: Not on file  . Food insecurity:    Worry: Not on file    Inability: Not on file  . Transportation needs:    Medical: Not on file    Non-medical: Not on file  Tobacco Use  . Smoking status: Never Smoker  . Smokeless tobacco: Never Used  Substance and Sexual Activity  . Alcohol use: No  . Drug use: No  . Sexual activity: Never    Birth control/protection: None  Lifestyle  .  Physical activity:    Days per week: Not on file    Minutes per session: Not on file  . Stress: Not on file  Relationships  . Social connections:    Talks on phone: Not on file    Gets together: Not on file    Attends religious service: Not on file    Active member of club or organization: Not on file    Attends meetings of clubs or organizations: Not on file    Relationship status: Not on file  . Intimate partner violence:    Fear of current or ex partner: Not on file    Emotionally abused: Not on file    Physically abused: Not on file    Forced sexual activity: Not on file  Other Topics Concern  . Not on file  Social History Narrative  . Not on file    FAMILY HISTORY: Family History  Problem Relation Age of Onset  . Alzheimer's disease Sister   . Dementia Sister   . Sexual abuse Sister   . Seizures Sister   . Alcohol abuse Brother   . Alcohol abuse Maternal Uncle   . Drug abuse Cousin   . Drug abuse Son   . Depression Neg Hx     ALLERGIES:  is allergic to penicillins.  MEDICATIONS:  Current Outpatient Medications  Medication Sig Dispense Refill  . acyclovir (ZOVIRAX) 400 MG tablet Take 1 tablet (400 mg total) by mouth daily. 30 tablet 3  . allopurinol (ZYLOPRIM) 300 MG tablet Take 0.5 tablets (150 mg total) by mouth daily. (Patient not taking: Reported on 10/25/2017) 30 tablet 0  . Calcium Carb-Cholecalciferol 600-100 MG-UNIT CAPS Take 1 tablet by mouth daily.    . calcium-vitamin D (OSCAL WITH D) 500-200 MG-UNIT tablet Take 1 tablet by mouth 3 (three) times daily. 90 tablet 12  . clotrimazole (LOTRIMIN) 1 % cream Apply 1 application topically 2 (two) times daily. 30 g 1  . dexamethasone (DECADRON) 4 MG tablet Take 2 tablets (8 mg total) by mouth daily. Start the day after bendamustine chemotherapy for 2 days. Take with food. 30 tablet 1  . HYDROcodone-acetaminophen (NORCO/VICODIN) 5-325 MG tablet TK 1 T PO  Q 4 H PRN P  0  . LORazepam (ATIVAN) 0.5 MG tablet Take 1  tablet (0.5 mg total) by mouth every 6 (six) hours as needed (Nausea or vomiting). 30 tablet 0  . ondansetron (ZOFRAN) 8 MG tablet Take 1 tablet (8 mg total) by mouth 2 (two) times daily as needed for refractory nausea / vomiting. Start on day 2 after bendamustine chemo. 30 tablet 1  . prochlorperazine (COMPAZINE) 10 MG tablet Take 1 tablet (10 mg total) by mouth every 6 (six) hours as needed (Nausea  or vomiting). 30 tablet 1  . risperiDONE (RISPERDAL) 0.5 MG tablet Take 1 tablet (0.5 mg total) by mouth at bedtime. 90 tablet 0  . sertraline (ZOLOFT) 50 MG tablet Take 1 tablet (50 mg total) by mouth daily. 90 tablet 0  . Tenofovir Alafenamide Fumarate 25 MG TABS Take 1 tablet (25 mg total) by mouth daily. 30 tablet 5  . vitamin C (ASCORBIC ACID) 500 MG tablet Take 500 mg by mouth daily.     No current facility-administered medications for this visit.     REVIEW OF SYSTEMS:    A 10+ POINT REVIEW OF SYSTEMS WAS OBTAINED including neurology, dermatology, psychiatry, cardiac, respiratory, lymph, extremities, GI, GU, Musculoskeletal, constitutional, breasts, reproductive, HEENT.  All pertinent positives are noted in the HPI.  All others are negative.   PHYSICAL EXAMINATION: ECOG PERFORMANCE STATUS: 1 - Symptomatic but completely ambulatory  Vitals:   11/22/17 1159  BP: 131/78  Pulse: 67  Resp: 18  SpO2: 100%   Filed Weights   11/22/17 1159  Weight: 167 lb 14.4 oz (76.2 kg)   .Body mass index is 29.74 kg/m.   GENERAL:alert, in no acute distress and comfortable SKIN: no acute rashes, no significant lesions EYES: conjunctiva are pink and non-injected, sclera anicteric OROPHARYNX: MMM, no exudates, no oropharyngeal erythema or ulceration NECK: supple, no JVD LYMPH:  Small palpable LN in the upper right neck and middle right neck, very small palpable LN in the right axilla. No palpable lymphadenopathy in the inguinal region LUNGS: clear to auscultation b/l with normal respiratory  effort HEART: regular rate & rhythm ABDOMEN:  normoactive bowel sounds , non tender, not distended. No palpable hepatosplenomegaly.  Extremity: no pedal edema PSYCH: alert & oriented x 3 with fluent speech NEURO: no focal motor/sensory deficits   LABORATORY DATA:  I have reviewed the data as listed  . CBC Latest Ref Rng & Units 11/22/2017 10/25/2017 09/27/2017  WBC 4.0 - 10.5 K/uL 4.5 1.1(L) 3.3(L)  Hemoglobin 12.0 - 15.0 g/dL 11.2(L) 10.5(L) 12.8  Hematocrit 36.0 - 46.0 % 33.5(L) 30.3(L) 37.8  Platelets 150 - 400 K/uL 123(L) 42(L) 102(L)   ANC 200 . CMP Latest Ref Rng & Units 11/22/2017 10/25/2017 09/27/2017  Glucose 70 - 99 mg/dL 101(H) 91 89  BUN 8 - 23 mg/dL 14 13 10   Creatinine 0.44 - 1.00 mg/dL 1.12(H) 0.88 0.98  Sodium 135 - 145 mmol/L 142 141 140  Potassium 3.5 - 5.1 mmol/L 3.8 3.4(L) 3.9  Chloride 98 - 111 mmol/L 105 105 103  CO2 22 - 32 mmol/L 28 28 27   Calcium 8.9 - 10.3 mg/dL 9.5 9.0 9.4  Total Protein 6.5 - 8.1 g/dL 6.6 6.0(L) 6.2(L)  Total Bilirubin 0.3 - 1.2 mg/dL 0.5 0.7 0.5  Alkaline Phos 38 - 126 U/L 80 75 79  AST 15 - 41 U/L 25 17 19   ALT 0 - 44 U/L 17 12 12    Component     Latest Ref Rng & Units 05/02/2017 06/29/2017  HBV DNA SERPL PCR-ACNC     IU/mL 290 HBV DNA not detected  HBV DNA SERPL PCR-LOG IU     log10 IU/mL 2.462 UNABLE TO CALCULATE  Test Info:      Comment Comment   Component     Latest Ref Rng & Units 07/12/2017  HBV DNA SERPL PCR-ACNC     IU/mL HBV DNA not detected  HBV DNA SERPL PCR-LOG IU     log10 IU/mL UNABLE TO CALCULATE  Test Info:  Comment   Component     Latest Ref Rng & Units 05/02/2017  HBV DNA SERPL PCR-ACNC     IU/mL 290  HBV DNA SERPL PCR-LOG IU     log10 IU/mL 2.462  Test Info:      Comment  Hep B Core Ab, IgM     Negative Negative  Hep B Core Ab, Tot     Negative Positive (A)  Hepatitis B Surface Ag     Negative Confirm. indicated  HCV Ab     0.0 - 0.9 s/co ratio <0.1  HBsAg Conf      Positive (A)     PATHOLOGY    Diagnosis 04/25/17  Lymph node, needle/core biopsy, left - FOLLICULAR LYMPHOMA, GRADE 1-2. - SEE ONCOLOGY TABLE. Microscopic Comment LYMPHOMA Histologic type: Non-Hodgkin B-cell lymphoma: Follicular lymphoma. Grade (if applicable): Low grade (grade 1-2). Flow cytometry: N/A. Immunohistochemical stains: CD20, CD3, CD5, CD10, CD23, CD43, bcl-2, bcl-6, CD138, light chains, Ki-67. Touch preps/imprints: N/A. Comments: Core biopsies reveal effacement by a vaguely nodular infiltrate of atypical lymphocytes. The lymphocytes are predominately small with irregular nuclear contours. There are no diffuse areas of large cells. Immunohistochemistry reveals abundant CD20-positive B-cells. CD3, CD43, and CD5 highlight interfollicular T-cells. Bcl-6 and CD23 reveal numerous small follicles with bcl-6 positive cells extending outside the follicular dendritic networks. The majority of these cells are bcl-2 positive. CD10 reveals only a few small residual germinal center foci. CD138 highlights scattered plasma cells which are polytypic by light chain in situ hybridization. The overall findings are most consistent with a low grade follicular lymphoma. Dr. Isaiah Blakes was notified on 04/27/2017.   RADIOGRAPHIC STUDIES: I have personally reviewed the radiological images as listed and agreed with the findings in the report. No results found.  Screening Mammogram 04/17/17 IMPRESSION:  The New enlarged nodes in both axillae are indeterminate. An [name] is recommended.    ASSESSMENT & PLAN:   Brooke Huber is a 72 y.o. Caucasian female with   1. 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  10/18/17 PET/CT revealed No measurable metabolic activity within previously enlarged cervical, axillary, mediastinal, and abdominal and pelvic lymph nodes ( [place] 1). 2. Findings support complete response to therapy. 3. Spleen is normal size with normal metabolic activity. 4. No evidence of marrow involvement.   2. Mild thrombocytopenia-- likely from lymphoma. ? BM involvement vs Immune thrombocytopenia.+ chemotherapy PLT improved to 42k   3. Chronic active Hepatitis B -Hepatitis B labs from 05/02/17 revealed chronic active disease which will be a limiting factor in her treatment.  -Pt has been started on Tenofovir AF with ID, Dr Linus Salmons on 05/24/17 -Tenofovir has successfully suppressed her Hep B virus as of 06/29/17 and 07/24/2017 and 10/17/2017 labs  PLAN: -Previously discussed that the pt has reached complete metabolic response after three cycles of Bendamustine/Rituxan and 2 cycles of only Rituxan (due to neutropenia) -Previously discussed that as the pt has Stage IV follicular lymphoma in complete response, and has had several prohibitive neutropenias during treatment, while also taking Tenofovir for her chronic Hep B, I would recommend that the pt not continue with chemotherapy at this time. The pt is agreeable to this.  -Discussed pt labwork today, 11/22/17; HGB improved to  11.2, neutropenia resolved with ANC to 3.2k, PLT improved to 123k. Blood chemistries are stable.  -Hep B was suppressed in last labs from September,2019 -Continue follow up with ID for Hep B management -Do not consider maintenance Rituxan to be appropriate for this pt considering risk posed by concern for possible Hep B reactivation. -Will now begin watchful monitoring phase  -Recommended that the pt obtain her 5 year pneumonia vaccines -Recommend pt obtain annual flu vaccine -Will repeat CT exam in 6 months  -Will plan to see the pt back in 3 months with labs    RTC with Dr Irene Limbo with labs in 3 months Flu  shot and prevnar vaccination today Pneumovax in 3 months on followup    All of the patients questions were answered with apparent satisfaction. The patient knows to call the clinic with any problems, questions or concerns.  The total time spent in the appt was 30 minutes and more than 50% was on counseling and direct patient cares.    Sullivan Lone MD MS AAHIVMS [facility] Hematology/Oncology Physician [facility]  (Office):       [phone] (Work cell):  [phone] (Fax):           [phone]  11/22/2017 12:57 PM  I, Baldwin [place], am acting as a scribe for Dr. Irene Limbo  .I have reviewed the above documentation for accuracy and completeness, and I agree with the above. Brunetta Genera MD

## 2017-11-22 NOTE — Telephone Encounter (Signed)
Printed avs and calender of upcoming appointment. Per 10/23 los 

## 2017-11-23 ENCOUNTER — Ambulatory Visit: Payer: Self-pay

## 2017-12-21 ENCOUNTER — Encounter (HOSPITAL_COMMUNITY): Payer: Self-pay | Admitting: Psychiatry

## 2017-12-21 ENCOUNTER — Ambulatory Visit (INDEPENDENT_AMBULATORY_CARE_PROVIDER_SITE_OTHER): Payer: PPO | Admitting: Psychiatry

## 2017-12-21 DIAGNOSIS — F410 Panic disorder [episodic paroxysmal anxiety] without agoraphobia: Secondary | ICD-10-CM

## 2017-12-21 DIAGNOSIS — F333 Major depressive disorder, recurrent, severe with psychotic symptoms: Secondary | ICD-10-CM | POA: Diagnosis not present

## 2017-12-21 DIAGNOSIS — F4310 Post-traumatic stress disorder, unspecified: Secondary | ICD-10-CM

## 2017-12-21 MED ORDER — SERTRALINE HCL 50 MG PO TABS: 50.0000 mg | ORAL_TABLET | Freq: Every day | ORAL | 0 refills | Status: DC

## 2017-12-21 MED ORDER — RISPERIDONE 0.5 MG PO TABS: 0.5000 mg | ORAL_TABLET | Freq: Every day | ORAL | 0 refills | Status: DC

## 2017-12-21 NOTE — Progress Notes (Signed)
[facility] MD/PA/NP OP Progress Note  12/21/2017 10:16 AM Brooke Huber  MRN:  [id number]  Chief Complaint:  Chief Complaint    Follow-up; Medication Refill     HPI: Patient reports that she is doing well and has no concerns at this time.  She only had to complete 3 treatments of chemo and reports that she is now in remission.  It was scary but she tolerated it well.  And she feels as though she has experienced a miracle.  She is denying all symptoms of depression, anxiety and panic attacks.  She denies any PTSD symptoms.  Sleep is good.  She denies isolation and reports that she has been active with friends and family.  Brooke Huber denies SI/HI/auditory and visual hallucinations.  She is taking her Risperdal and Zoloft as prescribed.   Visit Diagnosis:    ICD-10-CM   1. Severe episode of recurrent major depressive disorder, with psychotic features ([place]) F33.3 risperiDONE (RISPERDAL) 0.5 MG tablet    sertraline (ZOLOFT) 50 MG tablet  2. PTSD (post-traumatic stress disorder) F43.10 risperiDONE (RISPERDAL) 0.5 MG tablet    sertraline (ZOLOFT) 50 MG tablet  3. 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 Patient was hospitalized 3 others times in 1993, 1994 for suicidal ideation and and 1997 after her divorce. History of Electroconvulsive Shock Therapy:No Prior Suicide Attempts:No    Past Medical History:  Past Medical History:  Diagnosis Date  . 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 Cancer [facility])    breast lymphoma with mets  . Depression   . GERD (gastroesophageal reflux disease)   . Hepatitis   . Hepatitis B 02/1995  . Seizures ([place])    hx of  seizure in 1993 and 1994 - caused by stress per patient     Past Surgical History:  Procedure Laterality Date  . APPENDECTOMY    . COLONOSCOPY WITH PROPOFOL N/A 12/29/2015   Procedure: COLONOSCOPY WITH PROPOFOL;  Surgeon: Garlan Fair, MD;  Location: [facility];  Service: Endoscopy;  Laterality: N/A;  . TONSILLECTOMY AND ADENOIDECTOMY Bilateral 1954  . TUBAL LIGATION      Family Psychiatric History: Family History  Problem Relation Age of Onset  . Alzheimer's disease Sister   . Dementia Sister   . Sexual abuse Sister   . Seizures Sister   . Alcohol abuse Brother   . Alcohol abuse Maternal Uncle   . Drug abuse Cousin   . Drug abuse Son   . Depression Neg Hx     Social History:  Social History   Socioeconomic History  . Marital status: Single    Spouse name: Not on file  . Number of children: Not on file  . Years of education: Not on file  . Highest education level: Not on file  Occupational History  . Not on file  Social Needs  . Financial resource strain: Not on file  . Food insecurity:    Worry: Not on file    Inability: Not on file  . Transportation needs:    Medical: Not on file    Non-medical: Not on file  Tobacco Use  . Smoking status: Never Smoker  . Smokeless tobacco: Never Used  Substance and Sexual Activity  . Alcohol use: No  . Drug  use: No  . Sexual activity: Never    Birth control/protection: None  Lifestyle  . Physical activity:    Days per week: Not on file    Minutes per session: Not on file  . Stress: Not on file  Relationships  . Social connections:    Talks on phone: Not on file    Gets together: Not on file    Attends religious service: Not on file    Active member of club or organization: Not on file    Attends meetings of clubs or organizations: Not on file    Relationship status: Not on file  Other Topics Concern  . Not on file  Social History Narrative  . Not on file    Allergies:  Allergies  Allergen Reactions  .  Penicillins Hives and Rash    Metabolic Disorder Labs: Lab Results  Component Value Date   HGBA1C 5.5 10/06/2016   MPG 108 11/10/2015   MPG 123 06/10/2014   Lab Results  Component Value Date   PROLACTIN 37.0 (H) 10/06/2016   PROLACTIN 22.1 11/10/2015   Lab Results  Component Value Date   CHOL 140 10/06/2016   TRIG 81 10/06/2016   HDL 38 (L) 10/06/2016   CHOLHDL 2.7 06/10/2014   VLDL 14 06/10/2014   LDLCALC 86 10/06/2016   LDLCALC 67 06/10/2014   Lab Results  Component Value Date   TSH 1.350 10/06/2016   TSH 1.42 11/10/2015    Therapeutic Level Labs: No results found for: LITHIUM No results found for: VALPROATE No components found for:  CBMZ  Current Medications: Current Outpatient Medications  Medication Sig Dispense Refill  . acyclovir (ZOVIRAX) 400 MG tablet Take 1 tablet (400 mg total) by mouth daily. 30 tablet 3  . Calcium Carb-Cholecalciferol 600-100 MG-UNIT CAPS Take 1 tablet by mouth daily.    . calcium-vitamin D (OSCAL WITH D) 500-200 MG-UNIT tablet Take 1 tablet by mouth 3 (three) times daily. 90 tablet 12  . dexamethasone (DECADRON) 4 MG tablet Take 2 tablets (8 mg total) by mouth daily. Start the day after bendamustine chemotherapy for 2 days. Take with food. 30 tablet 1  . HYDROcodone-acetaminophen (NORCO/VICODIN) 5-325 MG tablet TK 1 T PO  Q 4 H PRN P  0  . ondansetron (ZOFRAN) 8 MG tablet Take 1 tablet (8 mg total) by mouth 2 (two) times daily as needed for refractory nausea / vomiting. Start on day 2 after bendamustine chemo. 30 tablet 1  . prochlorperazine (COMPAZINE) 10 MG tablet Take 1 tablet (10 mg total) by mouth every 6 (six) hours as needed (Nausea or vomiting). 30 tablet 1  . risperiDONE (RISPERDAL) 0.5 MG tablet Take 1 tablet (0.5 mg total) by mouth at bedtime. 90 tablet 0  . sertraline (ZOLOFT) 50 MG tablet Take 1 tablet (50 mg total) by mouth daily. 90 tablet 0  . Tenofovir Alafenamide Fumarate 25 MG TABS Take 1 tablet (25 mg total) by mouth  daily. 30 tablet 5  . vitamin C (ASCORBIC ACID) 500 MG tablet Take 500 mg by mouth daily.    Marland Kitchen allopurinol (ZYLOPRIM) 300 MG tablet Take 0.5 tablets (150 mg total) by mouth daily. (Patient not taking: Reported on 10/25/2017) 30 tablet 0  . clotrimazole (LOTRIMIN) 1 % cream Apply 1 application topically 2 (two) times daily. (Patient not taking: Reported on 12/21/2017) 30 g 1  . LORazepam (ATIVAN) 0.5 MG tablet Take 1 tablet (0.5 mg total) by mouth every 6 (six) hours as needed (Nausea or  vomiting). (Patient not taking: Reported on 12/21/2017) 30 tablet 0   No current facility-administered medications for this visit.      Musculoskeletal: Strength & Muscle Tone: within normal limits Gait & Station: normal Patient leans: N/A  Psychiatric Specialty Exam: Review of Systems  Gastrointestinal: Negative for abdominal pain, nausea and vomiting.  Musculoskeletal: Negative for back pain, joint pain and neck pain.    Blood pressure 121/74, pulse 68, height 5\' 3"  (1.6 m), weight 164 lb (74.4 kg), SpO2 97 %.Body mass index is 29.05 kg/m.  General Appearance: Casual and Fairly Groomed  Eye Contact:  Good  Speech:  Clear and Coherent and Slow  Volume:  Normal  Mood:  Euthymic  Affect:  Constricted  Thought Process:  Goal Directed and Descriptions of Associations: Intact  Orientation:  Full (Time, Place, and Person)  Thought Content:  Logical  Suicidal Thoughts:  No  Homicidal Thoughts:  No  Memory:  Immediate;   Good  Judgement:  Good  Insight:  Good  Psychomotor Activity:  Normal  Concentration:  Concentration: Good  Recall:  Good  Fund of Knowledge:  Good  Language:  Good  Akathisia:  No  Handed:  Right  AIMS (if indicated):     Assets:  Communication Skills Desire for Improvement Resilience Social Support Talents/Skills Transportation  ADL's:  Intact  Cognition:  WNL  Sleep:   good        Screenings: AIMS     Office Visit from 10/06/2016 in [facility] Admission (Discharged) from 06/08/2014 in [facility]  AIMS Total Score  0  0    AUDIT     Admission (Discharged) from 06/08/2014 in [facility]  Alcohol Use Disorder Identification Test Final Score (AUDIT)  0    PHQ2-9     Office Visit from 10/17/2017 in [facility] Office Visit from 07/17/2017 in [facility] Office Visit from 05/23/2017 in [facility]  PHQ-2 Total Score  0  0  0      I reviewed the information below on 12/21/17 and have updated it Assessment and Plan: MDD-severe with psychotic features; PTSD; panic disorder without agoraphobia    Medication management with supportive therapy. Risks and benefits, side effects and alternative treatment options discussed with patient. Pt was given an opportunity to ask questions about medication, illness, and treatment. All current psychiatric medications have been reviewed and discussed with the patient and adjusted as clinically appropriate. The patient has been provided an accurate and updated list of the medications being now prescribed. Patient expressed understanding of how their medications were to be used.  Pt verbalized understanding and verbal consent obtained for treatment.  The risk of un-intended pregnancy is low based on the fact that pt reports she is postmenopausal. Pt is aware that these meds carry a teratogenic risk. Pt will discuss plan of action if she does or plans to become pregnant in the future.  Status of current problems: stable  Meds: Zoloft 50 mg p.o. daily for MDD, PTSD and panic disorder Risperdal 0.5 mg p.o. nightly for psychosis related to MDD Ativan prn prescribed by Dr. Irene Limbo- pt is not taking it Pt does not want her meds changed at this time  Labs: Reviewed labs done on 11/22/2017 which showed creatinine of  1.12 and glucose of 101.  CBC shows hemoglobin of 11.2 and hematocrit  of 33.5.   Therapy: brief supportive therapy provided. Discussed psychosocial stressors in detail.     Consultations: Encouraged to follow up with PCP and oncologist  Pt denies SI and is at an acute low risk for suicide. Patient told to call clinic if any problems occur. Patient advised to go to ER if they should develop SI/HI, side effects, or if symptoms worsen. Has crisis numbers to call if needed. Pt verbalized understanding.  F/up in 4 months or sooner if needed    Charlcie Cradle, MD 12/21/2017, 10:16 AM

## 2018-01-17 ENCOUNTER — Encounter: Payer: Self-pay | Admitting: Internal Medicine

## 2018-01-17 ENCOUNTER — Ambulatory Visit (INDEPENDENT_AMBULATORY_CARE_PROVIDER_SITE_OTHER): Payer: PPO | Admitting: Internal Medicine

## 2018-01-17 VITALS — Wt 167.0 lb

## 2018-01-17 DIAGNOSIS — B181 Chronic viral hepatitis B without delta-agent: Secondary | ICD-10-CM | POA: Diagnosis not present

## 2018-01-17 DIAGNOSIS — C8218 Follicular lymphoma grade II, lymph nodes of multiple sites: Secondary | ICD-10-CM | POA: Diagnosis not present

## 2018-01-17 NOTE — Assessment & Plan Note (Signed)
No current chemo as above.

## 2018-01-17 NOTE — Progress Notes (Signed)
   Subjective:    Patient ID: Brooke Huber, female    DOB: Aug 14, 1945, 72 y.o.   MRN: [id number]  HPI Here for follow up of chronic hepatitis B.  She has low level viremia and follicular lymphoma and I started her on TAF prior to starting chemotherapy to prevent exacerbation.  Has been tolerating tenofovir well and no missed doses. No complaints today. Completed chemotherapy in November.  Not on any current immunosuppressive medications.    Review of Systems  Constitutional: Negative for fever and unexpected weight change.  Endocrine: Negative for polyuria.       Objective:   Physical Exam Constitutional:      General: She is not in acute distress.    Appearance: She is well-developed.  HENT:     Mouth/Throat:     Pharynx: No oropharyngeal exudate.  Cardiovascular:     Rate and Rhythm: Normal rate and regular rhythm.     Heart sounds: Normal heart sounds.  Pulmonary:     Effort: Pulmonary effort is normal.     Breath sounds: Normal breath sounds.  Skin:    Findings: No rash.           Assessment & Plan:

## 2018-01-17 NOTE — Assessment & Plan Note (Signed)
Will check her viral DNA, CMP and surface Ag today. Off of chemo and no current plans for more so she can stop the tenofovir for now She will call to reschedule if she is going to start further treatments and will consider restarting tenofovir

## 2018-01-19 LAB — COMPLETE METABOLIC PANEL WITH GFR
AG Ratio: 1.9 (calc) (ref 1.0–2.5)
ALT: 14 U/L (ref 6–29)
AST: 22 U/L (ref 10–35)
Albumin: 4.3 g/dL (ref 3.6–5.1)
Alkaline phosphatase (APISO): 80 U/L (ref 33–130)
BUN / CREAT RATIO: 15 (calc) (ref 6–22)
BUN: 17 mg/dL (ref 7–25)
CO2: 30 mmol/L (ref 20–32)
CREATININE: 1.16 mg/dL — AB (ref 0.60–0.93)
Calcium: 9.3 mg/dL (ref 8.6–10.4)
Chloride: 104 mmol/L (ref 98–110)
GFR, Est African American: 54 mL/min/{1.73_m2} — ABNORMAL LOW (ref >=60)
GFR, Est Non African American: 47 mL/min/{1.73_m2} — ABNORMAL LOW (ref >=60)
GLOBULIN: 2.3 g/dL (ref 1.9–3.7)
GLUCOSE: 92 mg/dL (ref 65–99)
Potassium: 4.2 mmol/L (ref 3.5–5.3)
SODIUM: 141 mmol/L (ref 135–146)
Total Bilirubin: 0.6 mg/dL (ref 0.2–1.2)
Total Protein: 6.6 g/dL (ref 6.1–8.1)

## 2018-01-19 LAB — HEPATITIS B DNA, ULTRAQUANTITATIVE, PCR: Hepatitis B DNA: <10 IU/mL

## 2018-01-19 LAB — HEPATITIS B SURFACE ANTIGEN: Hepatitis B Surface Ag: REACTIVE — AB

## 2018-02-20 ENCOUNTER — Other Ambulatory Visit: Payer: Self-pay | Admitting: Hematology

## 2018-02-20 NOTE — Progress Notes (Signed)
HEMATOLOGY/ONCOLOGY CLINIC NOTE  Date of Service:  02/21/18    Patient Care Team: Alroy Dust, L.Marlou Sa, MD as PCP - General (Family Medicine)  CHIEF COMPLAINTS/PURPOSE OF CONSULTATION:  F/u for mx of  Follicular Lymphoma  HISTORY OF PRESENTING ILLNESS:    Brooke Huber is a wonderful 73 y.o. female who has been referred to [name] by her PCP, Dr. 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 She presents to the clinic today accompanied by her friend.   She had a yearly screening mammogram on 04/17/17 which found her to have an enlarged axillary lymph nodes. She had a biopsy on 04/28/17 which showed evidence of lymphoma.   Today she notes she feels fine in general with no changes over the last 6 months. She hopes to go to the beach on the 18th of this month with her friend for 5 days.   She notes years ago in the late 1990s she went to donate blood and they told her she had Hep B, but never had liver damage/change or concerning symptoms. She never had been out of the country, tattoo or blood transfusion. She denies any other major comorbidities. She sees a psychiatrist every 4 months for her depression and anxiety. She notes diagnosis like this can trigger her depression but she is working to maintain her mood during this time.   On review of symptoms, pt notes limited ROM of her left arm for the last 2 years with tenderness. She notes arthritis in her hands and possibly in her shoulder. She denies bowel issues or trouble with bowel movements or passing urine.  Interval History:  Brooke Huber returns today regarding her low grade Follicular Lymphoma after completing BR treatment. The patient's last visit with [name] was on 11/22/17. The pt reports that she is doing well overall.   The pt reports that she noticed a small lump on the left side of her scalp, in either October or November, and notes that the size has decreased and it has become harder to the  touch.  The pt saw Dr. Linus Salmons in [place] in December and stopped Tenofovir at that time.   The pt denies nose bleeds, gum bleeds, or abnormal bruising. She also denies any concerns for current infections, coughs or colds. She denies any fevers, chills, night sweats, or unexpected weight loss. The pt notes that she is enjoying good energy levels, is eating well, and denies abdominal pains.   Lab results today (02/21/18) of CBC w/diff and CMP is as follows: all values are WNL except for PLT at 80k, Total Protein at 6.4, GFR at 57. 02/21/18 LDH is at 197 01/17/18 Hep B DNA <1.00  On review of systems, pt reports good energy levels, small bump in skin on head, eating well, stable weight, and denies fevers, chills, night sweats, other lumps or bumps, abdominal pains, nose bleeds, gum bleeds, blood in the stools, concerns for bleeding, abnormal bruising, and any other symptoms.   MEDICAL HISTORY:  Past Medical History:  Diagnosis Date  . 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 Cancer [facility])    breast lymphoma with mets  . Depression   . GERD (gastroesophageal reflux disease)   . Hepatitis   . Hepatitis B 02/1995  . Seizures ([place])    hx of seizure in 1993 and 1994 - caused by stress per patient   -Diverticulitis  -Osteopenia  -Acute gastritis   SURGICAL HISTORY: Past Surgical History:  Procedure Laterality  Date  . APPENDECTOMY    . COLONOSCOPY WITH PROPOFOL N/A 12/29/2015   Procedure: COLONOSCOPY WITH PROPOFOL;  Surgeon: Garlan Fair, MD;  Location: [facility] END[facility]COPY;  Service: Endoscopy;  Laterality: N/A;  . TONSILLECTOMY AND ADENOIDECTOMY Bilateral 1954  . TUBAL LIGATION      SOCIAL HISTORY: Social History   Socioeconomic History  . Marital status: Single    Spouse name: Not on file  . Number of children: Not on file  . Years of education: Not on file  . Highest education level: Not on file  Occupational History  . Not on file  Social Needs  . Financial resource strain: Not on  file  . Food insecurity:    Worry: Not on file    Inability: Not on file  . Transportation needs:    Medical: Not on file    Non-medical: Not on file  Tobacco Use  . Smoking status: Never Smoker  . Smokeless tobacco: Never Used  Substance and Sexual Activity  . Alcohol use: No  . Drug use: No  . Sexual activity: Never    Birth control/protection: None  Lifestyle  . Physical activity:    Days per week: Not on file    Minutes per session: Not on file  . Stress: Not on file  Relationships  . Social connections:    Talks on phone: Not on file    Gets together: Not on file    Attends religious service: Not on file    Active member of club or organization: Not on file    Attends meetings of clubs or organizations: Not on file    Relationship status: Not on file  . Intimate partner violence:    Fear of current or ex partner: Not on file    Emotionally abused: Not on file    Physically abused: Not on file    Forced sexual activity: Not on file  Other Topics Concern  . Not on file  Social History Narrative  . Not on file    FAMILY HISTORY: Family History  Problem Relation Age of Onset  . Alzheimer's disease Sister   . Dementia Sister   . Sexual abuse Sister   . Seizures Sister   . Alcohol abuse Brother   . Alcohol abuse Maternal Uncle   . Drug abuse Cousin   . Drug abuse Son   . Depression Neg Hx     ALLERGIES:  is allergic to penicillins.  MEDICATIONS:  Current Outpatient Medications  Medication Sig Dispense Refill  . acyclovir (ZOVIRAX) 400 MG tablet Take 1 tablet (400 mg total) by mouth daily. 30 tablet 3  . allopurinol (ZYLOPRIM) 300 MG tablet Take 0.5 tablets (150 mg total) by mouth daily. (Patient not taking: Reported on 10/25/2017) 30 tablet 0  . Calcium Carb-Cholecalciferol 600-100 MG-UNIT CAPS Take 1 tablet by mouth daily.    . calcium-vitamin D (OSCAL WITH D) 500-200 MG-UNIT tablet Take 1 tablet by mouth 3 (three) times daily. 90 tablet 12  . clotrimazole  (LOTRIMIN) 1 % cream Apply 1 application topically 2 (two) times daily. (Patient not taking: Reported on 12/21/2017) 30 g 1  . dexamethasone (DECADRON) 4 MG tablet Take 2 tablets (8 mg total) by mouth daily. Start the day after bendamustine chemotherapy for 2 days. Take with food. 30 tablet 1  . HYDROcodone-acetaminophen (NORCO/VICODIN) 5-325 MG tablet TK 1 T PO  Q 4 H PRN P  0  . LORazepam (ATIVAN) 0.5 MG tablet Take 1 tablet (0.5  mg total) by mouth every 6 (six) hours as needed (Nausea or vomiting). (Patient not taking: Reported on 12/21/2017) 30 tablet 0  . ondansetron (ZOFRAN) 8 MG tablet Take 1 tablet (8 mg total) by mouth 2 (two) times daily as needed for refractory nausea / vomiting. Start on day 2 after bendamustine chemo. (Patient not taking: Reported on 01/17/2018) 30 tablet 1  . prochlorperazine (COMPAZINE) 10 MG tablet Take 1 tablet (10 mg total) by mouth every 6 (six) hours as needed (Nausea or vomiting). 30 tablet 1  . risperiDONE (RISPERDAL) 0.5 MG tablet Take 1 tablet (0.5 mg total) by mouth at bedtime. 90 tablet 0  . sertraline (ZOLOFT) 50 MG tablet Take 1 tablet (50 mg total) by mouth daily. 90 tablet 0  . Tenofovir Alafenamide Fumarate 25 MG TABS Take 1 tablet (25 mg total) by mouth daily. 30 tablet 5  . vitamin C (ASCORBIC ACID) 500 MG tablet Take 500 mg by mouth daily.     No current facility-administered medications for this visit.     REVIEW OF SYSTEMS:    A 10+ POINT REVIEW OF SYSTEMS WAS OBTAINED including neurology, dermatology, psychiatry, cardiac, respiratory, lymph, extremities, GI, GU, Musculoskeletal, constitutional, breasts, reproductive, HEENT.  All pertinent positives are noted in the HPI.  All others are negative.   PHYSICAL EXAMINATION: ECOG PERFORMANCE STATUS: 1 - Symptomatic but completely ambulatory  Vitals:   02/21/18 1202  BP: (!) 142/75  Pulse: (!) 54  Resp: 18  Temp: 97.8 F (36.6 C)  SpO2: 100%   Filed Weights   02/21/18 1202  Weight: 167 lb  12.8 oz (76.1 kg)   .Body mass index is 29.72 kg/m.   GENERAL:alert, in no acute distress and comfortable SKIN: nodular firm lesion in the skin of left temporal area, superior to the left ear which is mobile and non tender  EYES: conjunctiva are pink and non-injected, sclera anicteric OROPHARYNX: MMM, no exudates, no oropharyngeal erythema or ulceration NECK: supple, no JVD LYMPH:  no palpable lymphadenopathy in the cervical, axillary or inguinal regions LUNGS: clear to auscultation b/l with normal respiratory effort HEART: regular rate & rhythm ABDOMEN:  normoactive bowel sounds , non tender, not distended. No palpable hepatosplenomegaly.  Extremity: no pedal edema PSYCH: alert & oriented x 3 with fluent speech NEURO: no focal motor/sensory deficits   LABORATORY DATA:  I have reviewed the data as listed  . CBC Latest Ref Rng & Units 02/21/2018 11/22/2017 10/25/2017  WBC 4.0 - 10.5 K/uL 4.1 4.5 1.1(L)  Hemoglobin 12.0 - 15.0 g/dL 12.5 11.2(L) 10.5(L)  Hematocrit 36.0 - 46.0 % 37.9 33.5(L) 30.3(L)  Platelets 150 - 400 K/uL 80(L) 123(L) 42(L)   ANC 200 . CMP Latest Ref Rng & Units 02/21/2018 01/17/2018 11/22/2017  Glucose 70 - 99 mg/dL 87 92 101(H)  BUN 8 - 23 mg/dL 11 17 14   Creatinine 0.44 - 1.00 mg/dL 0.98 1.16(H) 1.12(H)  Sodium 135 - 145 mmol/L 143 141 142  Potassium 3.5 - 5.1 mmol/L 3.8 4.2 3.8  Chloride 98 - 111 mmol/L 106 104 105  CO2 22 - 32 mmol/L 27 30 28   Calcium 8.9 - 10.3 mg/dL 9.0 9.3 9.5  Total Protein 6.5 - 8.1 g/dL 6.4(L) 6.6 6.6  Total Bilirubin 0.3 - 1.2 mg/dL 0.7 0.6 0.5  Alkaline Phos 38 - 126 U/L 75 - 80  AST 15 - 41 U/L 17 22 25   ALT 0 - 44 U/L 12 14 17    Component     Latest Ref  Rng & Units 05/02/2017 06/29/2017  HBV DNA SERPL PCR-ACNC     IU/mL 290 HBV DNA not detected  HBV DNA SERPL PCR-LOG IU     log10 IU/mL 2.462 UNABLE TO CALCULATE  Test Info:      Comment Comment   Component     Latest Ref Rng & Units 07/12/2017  HBV DNA SERPL PCR-ACNC      IU/mL HBV DNA not detected  HBV DNA SERPL PCR-LOG IU     log10 IU/mL UNABLE TO CALCULATE  Test Info:      Comment   Component     Latest Ref Rng & Units 05/02/2017  HBV DNA SERPL PCR-ACNC     IU/mL 290  HBV DNA SERPL PCR-LOG IU     log10 IU/mL 2.462  Test Info:      Comment  Hep B Core Ab, IgM     Negative Negative  Hep B Core Ab, Tot     Negative Positive (A)  Hepatitis B Surface Ag     Negative Confirm. indicated  HCV Ab     0.0 - 0.9 s/co ratio <0.1  HBsAg Conf      Positive (A)    PATHOLOGY    Diagnosis 04/25/17  Lymph node, needle/core biopsy, left - FOLLICULAR LYMPHOMA, GRADE 1-2. - SEE ONCOLOGY TABLE. Microscopic Comment LYMPHOMA Histologic type: Non-Hodgkin B-cell lymphoma: Follicular lymphoma. Grade (if applicable): Low grade (grade 1-2). Flow cytometry: N/A. Immunohistochemical stains: CD20, CD3, CD5, CD10, CD23, CD43, bcl-2, bcl-6, CD138, light chains, Ki-67. Touch preps/imprints: N/A. Comments: Core biopsies reveal effacement by a vaguely nodular infiltrate of atypical lymphocytes. The lymphocytes are predominately small with irregular nuclear contours. There are no diffuse areas of large cells. Immunohistochemistry reveals abundant CD20-positive B-cells. CD3, CD43, and CD5 highlight interfollicular T-cells. Bcl-6 and CD23 reveal numerous small follicles with bcl-6 positive cells extending outside the follicular dendritic networks. The majority of these cells are bcl-2 positive. CD10 reveals only a few small residual germinal center foci. CD138 highlights scattered plasma cells which are polytypic by light chain in situ hybridization. The overall findings are most consistent with a low grade follicular lymphoma. Dr. Isaiah Blakes was notified on 04/27/2017.   RADIOGRAPHIC STUDIES: I have personally reviewed the radiological images as listed and agreed with the findings in the report. No results found.  Screening Mammogram 04/17/17 IMPRESSION:  The New  enlarged nodes in both axillae are indeterminate. An [name] is recommended.    ASSESSMENT & PLAN:   Brooke Huber is a 73 y.o. Caucasian female with   1. 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  10/18/17 PET/CT revealed No measurable metabolic activity within previously enlarged cervical, axillary, mediastinal, and abdominal and pelvic lymph nodes ( [place] 1). 2. Findings support complete response to therapy. 3. Spleen is normal size with normal metabolic activity. 4. No evidence of marrow involvement.   The pt reached complete metabolic response after three cycles of Bendamustine/Rituxan and 2 cycles of only Rituxan (due to neutropenia)  2. Mild thrombocytopenia-- likely from lymphoma. ? BM involvement vs Immune thrombocytopenia.+ chemotherapy PLT improved to 42k   3. Chronic active Hepatitis B -Hepatitis B labs from 05/02/17 revealed chronic active disease which will be a limiting factor in  her treatment.  -Pt has been started on Tenofovir AF with ID, Dr Linus Salmons on 05/24/17 -Tenofovir has successfully suppressed her Hep B virus as of 06/29/17 and 07/24/2017 and 10/17/2017 labs  PLAN: -Discussed pt labwork today, 02/21/18; PLT at [facility], all other blood counts normal. Chemistries are stable with a normal creatinine and liver functions. LDH at 197.  -Pt has had fluctuating platelet values, not overtly concerning at this time, and will continue to monitor, no increased risk of bleeding associated. Pt will let me know if she develops any concerns for bleeding in the interim.  -Avoid NSAIDs. Tylenol is okay for mild pains.  -Feel that pt has an improving carbuncle on her head, recommend  she continues to watch this and discuss this further with her PCP if she feels it is worsening  -The pt shows no clinical or lab progression of Follicular Lymphoma at this time.  -No indication for further treatment at this time.   -Will evaluate pt with CT C/A/P in 12 weeks to rule out evidence of residual lymphoma given thrombocytopenia   -Continue follow up with ID for Hep B management -Do not consider maintenance Rituxan to be appropriate for this pt considering risk posed by concern for possible Hep B reactivation -Recommended that the pt obtain her 5 year pneumonia vaccines -Recommend pt obtain annual flu vaccine -Will see the pt back in 3 months     Ct chest/abd/pelvis in 12 weeks RTC with Dr Irene Limbo with labs in 3 months     All of the patients questions were answered with apparent satisfaction. The patient knows to call the clinic with any problems, questions or concerns.  The total time spent in the appt was 25 minutes and more than 50% was on counseling and direct patient cares.    Sullivan Lone MD MS AAHIVMS [facility] Hematology/Oncology Physician [facility]  (Office):       [phone] (Work cell):  [phone] (Fax):           [phone]  02/21/2018 12:39 PM  I, Baldwin [place], am acting as a scribe for Dr. Sullivan Lone.   .I have reviewed the above documentation for accuracy and completeness, and I agree with the above. Brunetta Genera MD

## 2018-02-21 ENCOUNTER — Telehealth: Payer: Self-pay | Admitting: *Deleted

## 2018-02-21 ENCOUNTER — Other Ambulatory Visit: Payer: Self-pay | Admitting: Hematology

## 2018-02-21 ENCOUNTER — Telehealth: Payer: Self-pay

## 2018-02-21 ENCOUNTER — Inpatient Hospital Stay: Payer: PPO | Attending: Hematology

## 2018-02-21 ENCOUNTER — Inpatient Hospital Stay: Payer: PPO

## 2018-02-21 ENCOUNTER — Inpatient Hospital Stay (HOSPITAL_BASED_OUTPATIENT_CLINIC_OR_DEPARTMENT_OTHER): Payer: PPO | Admitting: Hematology

## 2018-02-21 VITALS — BP 142/75 | HR 54 | Temp 97.8°F | Resp 18 | Ht 63.0 in | Wt 167.8 lb

## 2018-02-21 DIAGNOSIS — K219 Gastro-esophageal reflux disease without esophagitis: Secondary | ICD-10-CM

## 2018-02-21 DIAGNOSIS — C8218 Follicular lymphoma grade II, lymph nodes of multiple sites: Secondary | ICD-10-CM

## 2018-02-21 DIAGNOSIS — C8214 Follicular lymphoma grade II, lymph nodes of axilla and upper limb: Secondary | ICD-10-CM | POA: Diagnosis not present

## 2018-02-21 DIAGNOSIS — Z79899 Other long term (current) drug therapy: Secondary | ICD-10-CM | POA: Insufficient documentation

## 2018-02-21 DIAGNOSIS — D696 Thrombocytopenia, unspecified: Secondary | ICD-10-CM

## 2018-02-21 DIAGNOSIS — M8580 Other specified disorders of bone density and structure, unspecified site: Secondary | ICD-10-CM

## 2018-02-21 DIAGNOSIS — B1910 Unspecified viral hepatitis B without hepatic coma: Secondary | ICD-10-CM | POA: Diagnosis not present

## 2018-02-21 LAB — CBC WITH DIFFERENTIAL/PLATELET
Abs Immature Granulocytes: 0.06 10*3/uL (ref 0.00–0.07)
BASOS PCT: 1 %
Basophils Absolute: 0 10*3/uL (ref 0.0–0.1)
EOS PCT: 4 %
Eosinophils Absolute: 0.2 10*3/uL (ref 0.0–0.5)
HCT: 37.9 % (ref 36.0–46.0)
Hemoglobin: 12.5 g/dL (ref 12.0–15.0)
Immature Granulocytes: 2 %
LYMPHS PCT: 19 %
Lymphs Abs: 0.8 10*3/uL (ref 0.7–4.0)
MCH: 30.6 pg (ref 26.0–34.0)
MCHC: 33 g/dL (ref 30.0–36.0)
MCV: 92.9 fL (ref 80.0–100.0)
MONO ABS: 0.5 10*3/uL (ref 0.1–1.0)
Monocytes Relative: 13 %
Neutro Abs: 2.5 10*3/uL (ref 1.7–7.7)
Neutrophils Relative %: 61 %
Platelets: 80 10*3/uL — ABNORMAL LOW (ref 150–400)
RBC: 4.08 MIL/uL (ref 3.87–5.11)
RDW: 12.4 % (ref 11.5–15.5)
WBC: 4.1 10*3/uL (ref 4.0–10.5)
nRBC: 0 % (ref 0.0–0.2)

## 2018-02-21 LAB — CMP (CANCER CENTER ONLY)
ALK PHOS: 75 U/L (ref 38–126)
ALT: 12 U/L (ref 0–44)
ANION GAP: 10 (ref 5–15)
AST: 17 U/L (ref 15–41)
Albumin: 3.9 g/dL (ref 3.5–5.0)
BUN: 11 mg/dL (ref 8–23)
CALCIUM: 9 mg/dL (ref 8.9–10.3)
CO2: 27 mmol/L (ref 22–32)
Chloride: 106 mmol/L (ref 98–111)
Creatinine: 0.98 mg/dL (ref 0.44–1.00)
GFR, EST NON AFRICAN AMERICAN: 57 mL/min — AB (ref >60)
Glucose, Bld: 87 mg/dL (ref 70–99)
Potassium: 3.8 mmol/L (ref 3.5–5.1)
SODIUM: 143 mmol/L (ref 135–145)
TOTAL PROTEIN: 6.4 g/dL — AB (ref 6.5–8.1)
Total Bilirubin: 0.7 mg/dL (ref 0.3–1.2)

## 2018-02-21 LAB — LACTATE DEHYDROGENASE: LDH: 197 U/L — AB (ref 98–192)

## 2018-02-21 MED ORDER — PNEUMOCOCCAL 13-VAL CONJ VACC IM SUSP
0.5000 mL | Freq: Once | INTRAMUSCULAR | Status: DC
  Filled 2018-02-21: qty 0.5

## 2018-02-21 NOTE — Telephone Encounter (Signed)
To provide continuity of care and update records, contacted PCP office (Dr. Marlou Sa) to verify patient past pneumonia vaccines.  Per Arby Barrette at Dr. Randel Pigg office, patient received Pneumovax in October 2013 and Prevnar in January 2015.  Information entered in Epic and reported to Dr.Kale.

## 2018-02-21 NOTE — Patient Instructions (Signed)
Pneumococcal Conjugate Vaccine suspension for injection  What is this medicine?  PNEUMOCOCCAL VACCINE (NEU mo KOK al vak SEEN) is a vaccine used to prevent pneumococcus bacterial infections. These bacteria can cause serious infections like pneumonia, meningitis, and blood infections. This vaccine will lower your chance of getting pneumonia. If you do get pneumonia, it can make your symptoms milder and your illness shorter. This vaccine will not treat an infection and will not cause infection. This vaccine is recommended for infants and young children, adults with certain medical conditions, and adults 65 years or older.  This medicine may be used for other purposes; ask your health care provider or pharmacist if you have questions.  COMMON BRAND NAME(S): Prevnar, Prevnar 13  What should I tell my health care provider before I take this medicine?  They need to know if you have any of these conditions:  -bleeding problems  -fever  -immune system problems  -an unusual or allergic reaction to pneumococcal vaccine, diphtheria toxoid, other vaccines, latex, other medicines, foods, dyes, or preservatives  -pregnant or trying to get pregnant  -breast-feeding  How should I use this medicine?  This vaccine is for injection into a muscle. It is given by a health care professional.  A copy of Vaccine Information Statements will be given before each vaccination. Read this sheet carefully each time. The sheet may change frequently.  Talk to your pediatrician regarding the use of this medicine in children. While this drug may be prescribed for children as young as 6 weeks old for selected conditions, precautions do apply.  Overdosage: If you think you have taken too much of this medicine contact a poison control center or emergency room at once.  NOTE: This medicine is only for you. Do not share this medicine with others.  What if I miss a dose?  It is important not to miss your dose. Call your doctor or health care professional  if you are unable to keep an appointment.  What may interact with this medicine?  -medicines for cancer chemotherapy  -medicines that suppress your immune function  -steroid medicines like prednisone or cortisone  This list may not describe all possible interactions. Give your health care provider a list of all the medicines, herbs, non-prescription drugs, or dietary supplements you use. Also tell them if you smoke, drink alcohol, or use illegal drugs. Some items may interact with your medicine.  What should I watch for while using this medicine?  Mild fever and pain should go away in 3 days or less. Report any unusual symptoms to your doctor or health care professional.  What side effects may I notice from receiving this medicine?  Side effects that you should report to your doctor or health care professional as soon as possible:  -allergic reactions like skin rash, itching or hives, swelling of the face, lips, or tongue  -breathing problems  -confused  -fast or irregular heartbeat  -fever over 102 degrees F  -seizures  -unusual bleeding or bruising  -unusual muscle weakness  Side effects that usually do not require medical attention (report to your doctor or health care professional if they continue or are bothersome):  -aches and pains  -diarrhea  -fever of 102 degrees F or less  -headache  -irritable  -loss of appetite  -pain, tender at site where injected  -trouble sleeping  This list may not describe all possible side effects. Call your doctor for medical advice about side effects. You may report side effects to FDA   at 1-800-FDA-1088.  Where should I keep my medicine?  This does not apply. This vaccine is given in a clinic, pharmacy, doctor's office, or other health care setting and will not be stored at home.  NOTE: This sheet is a summary. It may not cover all possible information. If you have questions about this medicine, talk to your doctor, pharmacist, or health care provider.   2019 Elsevier/Gold  Standard (2013-10-24 10:27:27)

## 2018-02-21 NOTE — Telephone Encounter (Signed)
Printed avs and calender of upcoming appointment. Per 12/2 los 

## 2018-02-21 NOTE — Patient Instructions (Signed)
Thank you for choosing [facility] to provide your oncology and hematology care.  To afford each patient quality time with our providers, please arrive 30 minutes before your scheduled appointment time.  If you arrive late for your appointment, you may be asked to reschedule.  We strive to give you quality time with our providers, and arriving late affects you and other patients whose appointments are after yours.    If you are a no show for multiple scheduled visits, you may be dismissed from the clinic at the providers discretion.     Again, thank you for choosing [facility], our hope is that these requests will decrease the amount of time that you wait before being seen by our physicians.  ______________________________________________________________________   Should you have questions after your visit to the [facility], please contact our office at [phone] between the hours of 8:30 and 4:30 p.m.    Voicemails left after 4:30p.m will not be returned until the following business day.     For prescription refill requests, please have your pharmacy contact us directly.  Please also try to allow 48 hours for prescription requests.     Please contact the scheduling department for questions regarding scheduling.  For scheduling of procedures such as PET scans, CT scans, MRI, Ultrasound, etc please contact central scheduling at [phone].     Resources For Cancer Patients and Caregivers:    Oncolink.org:  A wonderful resource for patients and healthcare providers for information regarding your disease, ways to tract your treatment, what to expect, etc.      American Cancer Society:  800-227-2345  Can help patients locate various types of support and financial assistance   Cancer Care: 1-800-813-HOPE (4673) Provides financial assistance, online support groups, medication/co-pay assistance.     [place] DSS:  336-641-3447 Where to apply  for food stamps, Medicaid, and utility assistance   Medicare Rights Center: 800-333-4114 Helps people with Medicare understand their rights and benefits, navigate the Medicare system, and secure the quality healthcare they deserve   SCAT: 336-333-6589 [employer]'s shared-ride transportation service for eligible riders who have a disability that prevents them from riding the fixed route bus.     For additional information on assistance programs please contact our social worker:   Abigail Elmore:  [phone]  

## 2018-02-21 NOTE — Progress Notes (Unsigned)
Pneumovax 

## 2018-04-12 ENCOUNTER — Ambulatory Visit (HOSPITAL_COMMUNITY): Payer: PPO | Admitting: Psychiatry

## 2018-05-15 ENCOUNTER — Telehealth: Payer: Self-pay | Admitting: Hematology

## 2018-05-15 NOTE — Telephone Encounter (Signed)
Scheduled appt per 4/14 sch message - pt is aware of appt date and time

## 2018-05-16 ENCOUNTER — Other Ambulatory Visit: Payer: Self-pay

## 2018-05-16 ENCOUNTER — Inpatient Hospital Stay: Payer: PPO | Attending: Hematology

## 2018-05-16 ENCOUNTER — Ambulatory Visit (HOSPITAL_COMMUNITY)
Admission: RE | Admit: 2018-05-16 | Discharge: 2018-05-16 | Disposition: A | Discharge status: Home / Self Care | Payer: PPO | Source: Ambulatory Visit | Attending: Hematology | Admitting: Hematology

## 2018-05-16 ENCOUNTER — Ambulatory Visit (HOSPITAL_COMMUNITY): Payer: PPO

## 2018-05-16 DIAGNOSIS — B1910 Unspecified viral hepatitis B without hepatic coma: Secondary | ICD-10-CM | POA: Diagnosis not present

## 2018-05-16 DIAGNOSIS — C8218 Follicular lymphoma grade II, lymph nodes of multiple sites: Secondary | ICD-10-CM

## 2018-05-16 DIAGNOSIS — C8214 Follicular lymphoma grade II, lymph nodes of axilla and upper limb: Secondary | ICD-10-CM | POA: Diagnosis not present

## 2018-05-16 DIAGNOSIS — K219 Gastro-esophageal reflux disease without esophagitis: Secondary | ICD-10-CM | POA: Insufficient documentation

## 2018-05-16 DIAGNOSIS — F418 Other specified anxiety disorders: Secondary | ICD-10-CM | POA: Insufficient documentation

## 2018-05-16 DIAGNOSIS — R918 Other nonspecific abnormal finding of lung field: Secondary | ICD-10-CM | POA: Insufficient documentation

## 2018-05-16 DIAGNOSIS — C8290 Follicular lymphoma, unspecified, unspecified site: Secondary | ICD-10-CM | POA: Diagnosis not present

## 2018-05-16 DIAGNOSIS — D696 Thrombocytopenia, unspecified: Secondary | ICD-10-CM | POA: Diagnosis not present

## 2018-05-16 DIAGNOSIS — R161 Splenomegaly, not elsewhere classified: Secondary | ICD-10-CM | POA: Insufficient documentation

## 2018-05-16 DIAGNOSIS — R112 Nausea with vomiting, unspecified: Secondary | ICD-10-CM | POA: Diagnosis not present

## 2018-05-16 DIAGNOSIS — M8580 Other specified disorders of bone density and structure, unspecified site: Secondary | ICD-10-CM | POA: Insufficient documentation

## 2018-05-16 DIAGNOSIS — Z79899 Other long term (current) drug therapy: Secondary | ICD-10-CM | POA: Diagnosis not present

## 2018-05-16 DIAGNOSIS — M1990 Unspecified osteoarthritis, unspecified site: Secondary | ICD-10-CM | POA: Insufficient documentation

## 2018-05-16 LAB — CBC WITH DIFFERENTIAL/PLATELET
Abs Immature Granulocytes: 0.06 10*3/uL (ref 0.00–0.07)
Basophils Absolute: 0 10*3/uL (ref 0.0–0.1)
Basophils Relative: 1 %
Eosinophils Absolute: 0.1 10*3/uL (ref 0.0–0.5)
Eosinophils Relative: 2 %
HCT: 40.1 % (ref 36.0–46.0)
Hemoglobin: 13.2 g/dL (ref 12.0–15.0)
Immature Granulocytes: 2 %
Lymphocytes Relative: 13 %
Lymphs Abs: 0.5 10*3/uL — ABNORMAL LOW (ref 0.7–4.0)
MCH: 31 pg (ref 26.0–34.0)
MCHC: 32.9 g/dL (ref 30.0–36.0)
MCV: 94.1 fL (ref 80.0–100.0)
Monocytes Absolute: 0.5 10*3/uL (ref 0.1–1.0)
Monocytes Relative: 12 %
Neutro Abs: 2.6 10*3/uL (ref 1.7–7.7)
Neutrophils Relative %: 70 %
Platelets: 93 10*3/uL — ABNORMAL LOW (ref 150–400)
RBC: 4.26 MIL/uL (ref 3.87–5.11)
RDW: 13.2 % (ref 11.5–15.5)
WBC: 3.7 10*3/uL — ABNORMAL LOW (ref 4.0–10.5)
nRBC: 0 % (ref 0.0–0.2)

## 2018-05-16 LAB — CMP (CANCER CENTER ONLY)
ALT: 53 U/L — ABNORMAL HIGH (ref 0–44)
AST: 54 U/L — ABNORMAL HIGH (ref 15–41)
Albumin: 3.8 g/dL (ref 3.5–5.0)
Alkaline Phosphatase: 83 U/L (ref 38–126)
Anion gap: 11 (ref 5–15)
BUN: 16 mg/dL (ref 8–23)
CO2: 26 mmol/L (ref 22–32)
Calcium: 8.7 mg/dL — ABNORMAL LOW (ref 8.9–10.3)
Chloride: 105 mmol/L (ref 98–111)
Creatinine: 1.04 mg/dL — ABNORMAL HIGH (ref 0.44–1.00)
GFR, Est AFR Am: >60 mL/min (ref >60)
GFR, Estimated: 53 mL/min — ABNORMAL LOW (ref >60)
Glucose, Bld: 88 mg/dL (ref 70–99)
Potassium: 3.8 mmol/L (ref 3.5–5.1)
Sodium: 142 mmol/L (ref 135–145)
Total Bilirubin: 0.5 mg/dL (ref 0.3–1.2)
Total Protein: 6.6 g/dL (ref 6.5–8.1)

## 2018-05-16 LAB — LACTATE DEHYDROGENASE: LDH: 201 U/L — ABNORMAL HIGH (ref 98–192)

## 2018-05-16 MED ORDER — IOHEXOL 300 MG/ML  SOLN
100.0000 mL | Freq: Once | INTRAMUSCULAR | Status: AC | PRN
  Administered 2018-05-16: 15:00:00 100 mL via INTRAVENOUS

## 2018-05-22 NOTE — Progress Notes (Signed)
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 female who has been referred to [name] by her PCP, Dr. 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 She presents to the clinic today accompanied by her friend.   She had a yearly screening mammogram on 04/17/17 which found her to have an enlarged axillary lymph nodes. She had a biopsy on 04/28/17 which showed evidence of lymphoma.   Today she notes she feels fine in general with no changes over the last 6 months. She hopes to go to the beach on the 18th of this month with her friend for 5 days.   She notes years ago in the late 1990s she went to donate blood and they told her she had Hep B, but never had liver damage/change or concerning symptoms. She never had been out of the country, tattoo or blood transfusion. She denies any other major comorbidities. She sees a psychiatrist every 4 months for her depression and anxiety. She notes diagnosis like this can trigger her depression but she is working to maintain her mood during this time.   On review of symptoms, pt notes limited ROM of her left arm for the last 2 years with tenderness. She notes arthritis in her hands and possibly in her shoulder. She denies bowel issues or trouble with bowel movements or passing urine.  Interval History:  Brooke Huber returns today regarding her low grade Follicular Lymphoma after completing BR treatment. The patient's last visit with [name] was on 02/21/18. The pt reports that she is doing well overall.  The pt notes that she has had some nausea over the last few days. She notes that she vomited a few times, about every other day. She endorses some soft stools and denies concern for eating  anything different. The pt notes that she currently is not nauseous and thinks she is better. She denies any associations, noting that her episodic nausea would present "out of the blue."  The pt reports that she has not developed any new concerns in the interim. She denies any new lumps or bumps, fevers, chills, night sweats, or unexpected weight loss. She denies concerns for infections nor skin rashes. The pt notes that she does not go into public spaces and denies consuming alcohol. The pt notes that she last saw Dr. Linus Salmons in December. She denies any medication changes in the interim.  Of note since the patient's last visit, pt has had a CT C/A/P completed on 05/16/18 with results revealing No residual or recurrent lymphadenopathy in the chest, abdomen, or pelvis. 2. Mild splenomegaly, maximum span 13.2 cm, significantly decreased in size compared to prior examinations. 3. Stable incidental 7 mm ground-glass opacity of the right upper lobe (series 6, image 54). Attention on follow-up.  Lab results today (05/16/18) of CBC w/diff and CMP is as follows: all values are WNL except for WBC at 3.7k, PLT at 93k, Lymphs abs at 500, Creatinine at 1.04, Calcium at 8.7, AST at 54, ALT at 53, GFR at 53. 05/23/18 LDH at 201  On review of systems, pt reports good energy levels, eating well, recent nausea and vomiting, and denies fevers, chills, night sweats, unexpected weight loss, skin rashes, new lumps or bumps,  leg swelling, abdominal pains, changes in bowel habits, and any other symptoms.  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   Diagnosed in hands    Cancer [facility])    breast lymphoma with mets   Depression    GERD (gastroesophageal reflux disease)    Hepatitis    Hepatitis B 02/1995   Seizures (HCC)    hx of seizure in 1993 and 1994 - caused by stress per patient   -Diverticulitis  -Osteopenia  -Acute gastritis   SURGICAL HISTORY: Past Surgical History:    Procedure Laterality Date   APPENDECTOMY     COLONOSCOPY WITH PROPOFOL N/A 12/29/2015   Procedure: COLONOSCOPY WITH PROPOFOL;  Surgeon: Garlan Fair, MD;  Location: [facility];  Service: Endoscopy;  Laterality: N/A;   TONSILLECTOMY AND ADENOIDECTOMY Bilateral 1954   TUBAL LIGATION      SOCIAL HISTORY: Social History   Socioeconomic History   Marital status: Single    Spouse name: Not on file   Number of children: Not on file   Years of education: Not on file   Highest education level: Not on file  Occupational History   Not on file  Social Needs   Financial resource strain: Not on file   Food insecurity:    Worry: Not on file    Inability: Not on file   Transportation needs:    Medical: Not on file    Non-medical: Not on file  Tobacco Use   Smoking status: Never Smoker   Smokeless tobacco: Never Used  Substance and Sexual Activity   Alcohol use: No   Drug use: No   Sexual activity: Never    Birth control/protection: None  Lifestyle   Physical activity:    Days per week: Not on file    Minutes per session: Not on file   Stress: Not on file  Relationships   Social connections:    Talks on phone: Not on file    Gets together: Not on file    Attends religious service: Not on file    Active member of club or organization: Not on file    Attends meetings of clubs or organizations: Not on file    Relationship status: Not on file   Intimate partner violence:    Fear of current or ex partner: Not on file    Emotionally abused: Not on file    Physically abused: Not on file    Forced sexual activity: Not on file  Other Topics Concern   Not on file  Social History Narrative   Not on file    FAMILY HISTORY: Family History  Problem Relation Age of Onset   Alzheimer's disease Sister    Dementia Sister    Sexual abuse Sister    Seizures Sister    Alcohol abuse Brother    Alcohol abuse Maternal Uncle    Drug abuse Cousin     Drug abuse Son    Depression Neg Hx     ALLERGIES:  is allergic to penicillins.  MEDICATIONS:  Current Outpatient Medications  Medication Sig Dispense Refill   acyclovir (ZOVIRAX) 400 MG tablet Take 1 tablet (400 mg total) by mouth daily. 30 tablet 3   Calcium Carb-Cholecalciferol 600-100 MG-UNIT CAPS Take 1 tablet by mouth daily.     calcium-vitamin D (OSCAL WITH D) 500-200 MG-UNIT tablet Take 1 tablet by mouth 3 (three) times daily. 90 tablet 12   clotrimazole (LOTRIMIN) 1 % cream Apply  1 application topically 2 (two) times daily. 30 g 1   dexamethasone (DECADRON) 4 MG tablet Take 2 tablets (8 mg total) by mouth daily. Start the day after bendamustine chemotherapy for 2 days. Take with food. 30 tablet 1   HYDROcodone-acetaminophen (NORCO/VICODIN) 5-325 MG tablet TK 1 T PO  Q 4 H PRN P  0   LORazepam (ATIVAN) 0.5 MG tablet Take 1 tablet (0.5 mg total) by mouth every 6 (six) hours as needed (Nausea or vomiting). 30 tablet 0   ondansetron (ZOFRAN) 8 MG tablet Take 1 tablet (8 mg total) by mouth 2 (two) times daily as needed for refractory nausea / vomiting. Start on day 2 after bendamustine chemo. 30 tablet 1   prochlorperazine (COMPAZINE) 10 MG tablet Take 1 tablet (10 mg total) by mouth every 6 (six) hours as needed (Nausea or vomiting). 30 tablet 1   risperiDONE (RISPERDAL) 0.5 MG tablet Take 1 tablet (0.5 mg total) by mouth at bedtime. 90 tablet 0   sertraline (ZOLOFT) 50 MG tablet Take 1 tablet (50 mg total) by mouth daily. 90 tablet 0   Tenofovir Alafenamide Fumarate 25 MG TABS Take 1 tablet (25 mg total) by mouth daily. 30 tablet 5   vitamin C (ASCORBIC ACID) 500 MG tablet Take 500 mg by mouth daily.     No current facility-administered medications for this visit.    Facility-Administered Medications Ordered in Other Visits  Medication Dose Route Frequency Provider Last Rate Last Dose   pneumococcal 13-valent conjugate vaccine (PREVNAR 13) injection 0.5 mL  0.5 mL  Intramuscular Once Irene Limbo, Cloria Spring, MD        REVIEW OF SYSTEMS:    A 10+ POINT REVIEW OF SYSTEMS WAS OBTAINED including neurology, dermatology, psychiatry, cardiac, respiratory, lymph, extremities, GI, GU, Musculoskeletal, constitutional, breasts, reproductive, HEENT.  All pertinent positives are noted in the HPI.  All others are negative.   PHYSICAL EXAMINATION: ECOG PERFORMANCE STATUS: 1 - Symptomatic but completely ambulatory  Vitals:   05/23/18 1159  BP: 118/60  Pulse: 66  Resp: 17  Temp: 98.4 F (36.9 C)  SpO2: 97%   Filed Weights   05/23/18 1159  Weight: 172 lb 8 oz (78.2 kg)   .Body mass index is 30.56 kg/m.   GENERAL:alert, in no acute distress and comfortable SKIN: no acute rashes, no significant lesions EYES: conjunctiva are pink and non-injected, sclera anicteric OROPHARYNX: MMM, no exudates, no oropharyngeal erythema or ulceration NECK: supple, no JVD LYMPH:  no palpable lymphadenopathy in the cervical, axillary or inguinal regions LUNGS: clear to auscultation b/l with normal respiratory effort HEART: regular rate & rhythm ABDOMEN:  normoactive bowel sounds , non tender, not distended. No palpable hepatosplenomegaly.  Extremity: no pedal edema PSYCH: alert & oriented x 3 with fluent speech NEURO: no focal motor/sensory deficits   LABORATORY DATA:  I have reviewed the data as listed  . CBC Latest Ref Rng & Units 05/16/2018 02/21/2018 11/22/2017  WBC 4.0 - 10.5 K/uL 3.7(L) 4.1 4.5  Hemoglobin 12.0 - 15.0 g/dL 13.2 12.5 11.2(L)  Hematocrit 36.0 - 46.0 % 40.1 37.9 33.5(L)  Platelets 150 - 400 K/uL 93(L) 80(L) 123(L)   ANC 200 . CMP Latest Ref Rng & Units 05/16/2018 02/21/2018 01/17/2018  Glucose 70 - 99 mg/dL 88 87 92  BUN 8 - 23 mg/dL 16 11 17   Creatinine 0.44 - 1.00 mg/dL 1.04(H) 0.98 1.16(H)  Sodium 135 - 145 mmol/L 142 143 141  Potassium 3.5 - 5.1 mmol/L 3.8 3.8 4.2  Chloride 98 - 111 mmol/L 105 106 104  CO2 22 - 32 mmol/L 26 27 30   Calcium 8.9  - 10.3 mg/dL 8.7(L) 9.0 9.3  Total Protein 6.5 - 8.1 g/dL 6.6 6.4(L) 6.6  Total Bilirubin 0.3 - 1.2 mg/dL 0.5 0.7 0.6  Alkaline Phos 38 - 126 U/L 83 75 -  AST 15 - 41 U/L 54(H) 17 22  ALT 0 - 44 U/L 53(H) 12 14   Component     Latest Ref Rng & Units 05/02/2017 06/29/2017  HBV DNA SERPL PCR-ACNC     IU/mL 290 HBV DNA not detected  HBV DNA SERPL PCR-LOG IU     log10 IU/mL 2.462 UNABLE TO CALCULATE  Test Info:      Comment Comment   Component     Latest Ref Rng & Units 07/12/2017  HBV DNA SERPL PCR-ACNC     IU/mL HBV DNA not detected  HBV DNA SERPL PCR-LOG IU     log10 IU/mL UNABLE TO CALCULATE  Test Info:      Comment   Component     Latest Ref Rng & Units 05/02/2017  HBV DNA SERPL PCR-ACNC     IU/mL 290  HBV DNA SERPL PCR-LOG IU     log10 IU/mL 2.462  Test Info:      Comment  Hep B Core Ab, IgM     Negative Negative  Hep B Core Ab, Tot     Negative Positive (A)  Hepatitis B Surface Ag     Negative Confirm. indicated  HCV Ab     0.0 - 0.9 s/co ratio <0.1  HBsAg Conf      Positive (A)    PATHOLOGY    Diagnosis 04/25/17  Lymph node, needle/core biopsy, left - FOLLICULAR LYMPHOMA, GRADE 1-2. - SEE ONCOLOGY TABLE. Microscopic Comment LYMPHOMA Histologic type: Non-Hodgkin B-cell lymphoma: Follicular lymphoma. Grade (if applicable): Low grade (grade 1-2). Flow cytometry: N/A. Immunohistochemical stains: CD20, CD3, CD5, CD10, CD23, CD43, bcl-2, bcl-6, CD138, light chains, Ki-67. Touch preps/imprints: N/A. Comments: Core biopsies reveal effacement by a vaguely nodular infiltrate of atypical lymphocytes. The lymphocytes are predominately small with irregular nuclear contours. There are no diffuse areas of large cells. Immunohistochemistry reveals abundant CD20-positive B-cells. CD3, CD43, and CD5 highlight interfollicular T-cells. Bcl-6 and CD23 reveal numerous small follicles with bcl-6 positive cells extending outside the follicular dendritic networks. The majority of  these cells are bcl-2 positive. CD10 reveals only a few small residual germinal center foci. CD138 highlights scattered plasma cells which are polytypic by light chain in situ hybridization. The overall findings are most consistent with a low grade follicular lymphoma. Dr. Isaiah Blakes was notified on 04/27/2017.   RADIOGRAPHIC STUDIES: I have personally reviewed the radiological images as listed and agreed with the findings in the report. Ct Chest W Contrast  Result Date: 05/17/2018 CLINICAL DATA:  Follow-up follicular lymphoma EXAM: CT CHEST, ABDOMEN, AND PELVIS WITH CONTRAST TECHNIQUE: Multidetector CT imaging of the chest, abdomen and pelvis was performed following the standard protocol during bolus administration of intravenous contrast. 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 Normal heart size. No pericardial effusion. Mediastinum/Nodes: No enlarged mediastinal, hilar, or axillary lymph nodes. Thyroid gland, trachea, and esophagus demonstrate no significant findings. Lungs/Pleura: Stable 7 mm ground-glass opacity of the right upper lobe (series 6, image 54). No pleural effusion or pneumothorax.  Musculoskeletal: No chest wall mass or suspicious bone lesions identified. CT ABDOMEN PELVIS FINDINGS Hepatobiliary: Low-attenuation cysts of the liver. No gallstones, gallbladder wall thickening, or biliary dilatation. Pancreas: Unremarkable. No pancreatic ductal dilatation or surrounding inflammatory changes. Spleen: Mild splenomegaly, maximum span 13.2 cm, significantly decreased in size compared to prior examinations. Adrenals/Urinary Tract: Adrenal glands are unremarkable. Kidneys are normal, without renal calculi, focal lesion, or hydronephrosis. Bladder is unremarkable. Stomach/Bowel: Stomach is  within normal limits. No evidence of bowel wall thickening, distention, or inflammatory changes. Occasional sigmoid diverticula. Vascular/Lymphatic: No significant vascular findings are present. No enlarged abdominal or pelvic lymph nodes. Reproductive: No mass or other abnormality. Other: No abdominal wall hernia or abnormality. No abdominopelvic ascites. Musculoskeletal: No acute or significant osseous findings. IMPRESSION: 1. No residual or recurrent lymphadenopathy in the chest, abdomen, or pelvis. 2. Mild splenomegaly, maximum span 13.2 cm, significantly decreased in size compared to prior examinations. 3. Stable incidental 7 mm ground-glass opacity of the right upper lobe (series 6, image 54). Attention on follow-up. Electronically Signed   By: Eddie Candle M.D.   On: 05/17/2018 09:20   Ct Abdomen Pelvis W Contrast  Result Date: 05/17/2018 CLINICAL DATA:  Follow-up follicular lymphoma EXAM: CT CHEST, ABDOMEN, AND PELVIS WITH CONTRAST TECHNIQUE: Multidetector CT imaging of the chest, abdomen and pelvis was performed following the standard protocol during bolus administration of intravenous contrast. 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 Normal heart size. No pericardial effusion. Mediastinum/Nodes: No enlarged mediastinal, hilar, or axillary lymph nodes. Thyroid gland, trachea, and esophagus demonstrate no significant findings. Lungs/Pleura: Stable 7 mm ground-glass opacity of the right upper lobe (series 6, image 54). No pleural effusion or pneumothorax. Musculoskeletal: No chest wall mass or suspicious bone lesions identified. CT ABDOMEN PELVIS FINDINGS Hepatobiliary: Low-attenuation cysts of the liver. No gallstones, gallbladder wall thickening, or biliary dilatation. Pancreas: Unremarkable.  No pancreatic ductal dilatation or surrounding inflammatory changes. Spleen: Mild splenomegaly, maximum span 13.2 cm, significantly decreased in size compared to prior examinations. Adrenals/Urinary Tract: Adrenal glands are unremarkable. Kidneys are normal, without renal calculi, focal lesion, or hydronephrosis. Bladder is unremarkable. Stomach/Bowel: Stomach is within normal limits. No evidence of bowel wall thickening, distention, or inflammatory changes. Occasional sigmoid diverticula. Vascular/Lymphatic: No significant vascular findings are present. No enlarged abdominal or pelvic lymph nodes. Reproductive: No mass or other abnormality. Other: No abdominal wall hernia or abnormality. No abdominopelvic ascites. Musculoskeletal: No acute or significant osseous findings. IMPRESSION: 1. No residual or recurrent lymphadenopathy in the chest, abdomen, or pelvis. 2. Mild splenomegaly, maximum span 13.2 cm, significantly decreased in size compared to prior examinations. 3. Stable incidental 7 mm ground-glass opacity of the right upper lobe (series 6, image 54). Attention on follow-up. Electronically Signed   By: AEddie CandleM.D.   On: 05/17/2018 09:20    Screening Mammogram 04/17/17 IMPRESSION:  The New enlarged nodes in both axillae are indeterminate. An [place] recommended.    ASSESSMENT & PLAN:   MDESTENIE INGBERis a 73y.o. Caucasian female with   1. 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  10/18/17 PET/CT revealed No measurable metabolic activity within previously enlarged cervical, axillary, mediastinal, and abdominal and pelvic lymph nodes ( [place] 1). 2. Findings  support complete response to therapy. 3. Spleen is normal size with normal metabolic activity. 4. No evidence of marrow involvement.   The pt reached complete metabolic response after three cycles of Bendamustine/Rituxan and 2 cycles of only Rituxan (due to neutropenia)  2. Mild thrombocytopenia-- likely from lymphoma. ? BM involvement vs Immune thrombocytopenia.+ chemotherapy PLT improved to 42k   3. Chronic active Hepatitis B -Hepatitis B labs from 05/02/17 revealed chronic active disease which will be a limiting factor in her treatment.  -Pt completed Tenofovir AF with ID, Dr Linus Salmons -Tenofovir has successfully suppressed her Hep B virus as of 06/29/17, 07/24/2017, 10/17/2017 and 01/17/18 labs  PLAN: -Discussed pt labwork from 05/16/18; transaminases a little higher with AST at 54 and ALT at 53. Blood counts are stable. -Discussed the 05/16/18 CT C/A/P which revealed No residual or recurrent lymphadenopathy in the chest, abdomen, or pelvis. 2. Mild splenomegaly, maximum span 13.2 cm, significantly decreased in size compared to prior examinations. 3. Stable incidental 7 mm ground-glass opacity of the right upper lobe (series 6, image 54). Attention on follow-up. -Recommend that pt repeat liver functions with PCP Dr. Donnie Coffin in 2 months given recent elevation in transaminases, do not correlate with recent CT imaging. If still elevated, may need repeat work up for Hep B. -The pt shows no clinical, radiographic, or lab progression of her follicular lymphoma at this time.  -Do not consider maintenance Rituxan to be appropriate for this pt considering risk posed by concern for possible Hep B reactivation -Pt has had fluctuating platelet values, not overtly concerning at this time, and will continue to monitor, no increased risk of bleeding associated. Pt will let me know if she develops any concerns for bleeding in the interim. -Avoid NSAIDs. Tylenol is okay for mild pains.  -Continue follow up with ID  for Hep B management -Recommended that the pt obtain her pneumonia vaccines -Recommend pt obtain annual flu vaccine -Recommended that the pt continue to eat well, drink at least 48-64 oz of water each day, and walk 20-30 minutes each day. -Will see the pt back in 4 months with labs   RTC with Dr Irene Limbo with labs in 4 months    All of the patients questions were answered with apparent satisfaction. The patient knows to call the clinic with any problems, questions or concerns.  The total time spent in the appt was 20 minutes and more than 50% was on counseling and direct patient cares.    Sullivan Lone MD MS AAHIVMS [facility] Hematology/Oncology Physician [facility]  (Office):       [phone] (Work cell):  [phone] (Fax):           [phone]  05/23/2018 1:09 PM  I, Baldwin [place], am acting as a scribe for Dr. Sullivan Lone.   .I have reviewed the above documentation for accuracy and completeness, and I agree with the above. Brunetta Genera MD

## 2018-05-23 ENCOUNTER — Other Ambulatory Visit: Payer: Self-pay

## 2018-05-23 ENCOUNTER — Inpatient Hospital Stay (HOSPITAL_BASED_OUTPATIENT_CLINIC_OR_DEPARTMENT_OTHER): Payer: PPO | Admitting: Hematology

## 2018-05-23 VITALS — BP 118/60 | HR 66 | Temp 98.4°F | Resp 17 | Ht 63.0 in | Wt 172.5 lb

## 2018-05-23 DIAGNOSIS — K219 Gastro-esophageal reflux disease without esophagitis: Secondary | ICD-10-CM | POA: Diagnosis not present

## 2018-05-23 DIAGNOSIS — C8218 Follicular lymphoma grade II, lymph nodes of multiple sites: Secondary | ICD-10-CM

## 2018-05-23 DIAGNOSIS — M1990 Unspecified osteoarthritis, unspecified site: Secondary | ICD-10-CM | POA: Diagnosis not present

## 2018-05-23 DIAGNOSIS — F418 Other specified anxiety disorders: Secondary | ICD-10-CM

## 2018-05-23 DIAGNOSIS — D696 Thrombocytopenia, unspecified: Secondary | ICD-10-CM | POA: Diagnosis not present

## 2018-05-23 DIAGNOSIS — Z79899 Other long term (current) drug therapy: Secondary | ICD-10-CM | POA: Diagnosis not present

## 2018-05-23 DIAGNOSIS — R918 Other nonspecific abnormal finding of lung field: Secondary | ICD-10-CM | POA: Diagnosis not present

## 2018-05-23 DIAGNOSIS — C8214 Follicular lymphoma grade II, lymph nodes of axilla and upper limb: Secondary | ICD-10-CM | POA: Diagnosis not present

## 2018-05-23 DIAGNOSIS — R112 Nausea with vomiting, unspecified: Secondary | ICD-10-CM

## 2018-05-23 DIAGNOSIS — B1910 Unspecified viral hepatitis B without hepatic coma: Secondary | ICD-10-CM | POA: Diagnosis not present

## 2018-05-23 DIAGNOSIS — M8580 Other specified disorders of bone density and structure, unspecified site: Secondary | ICD-10-CM

## 2018-05-23 DIAGNOSIS — R161 Splenomegaly, not elsewhere classified: Secondary | ICD-10-CM

## 2018-05-31 DIAGNOSIS — R5383 Other fatigue: Secondary | ICD-10-CM | POA: Diagnosis not present

## 2018-06-01 DIAGNOSIS — R5383 Other fatigue: Secondary | ICD-10-CM | POA: Diagnosis not present

## 2018-06-02 ENCOUNTER — Inpatient Hospital Stay (HOSPITAL_COMMUNITY)
Admission: EM | Admit: 2018-06-02 | Discharge: 2018-06-12 | DRG: 441 | Disposition: A | Discharge status: Home / Self Care | Payer: PPO | Attending: Internal Medicine | Admitting: Internal Medicine

## 2018-06-02 ENCOUNTER — Encounter (HOSPITAL_COMMUNITY): Payer: Self-pay

## 2018-06-02 ENCOUNTER — Other Ambulatory Visit: Payer: Self-pay

## 2018-06-02 ENCOUNTER — Emergency Department (HOSPITAL_COMMUNITY): Payer: PPO

## 2018-06-02 DIAGNOSIS — K831 Obstruction of bile duct: Secondary | ICD-10-CM | POA: Diagnosis not present

## 2018-06-02 DIAGNOSIS — R569 Unspecified convulsions: Secondary | ICD-10-CM | POA: Diagnosis not present

## 2018-06-02 DIAGNOSIS — Z598 Other problems related to housing and economic circumstances: Secondary | ICD-10-CM | POA: Diagnosis not present

## 2018-06-02 DIAGNOSIS — K5900 Constipation, unspecified: Secondary | ICD-10-CM | POA: Diagnosis not present

## 2018-06-02 DIAGNOSIS — E871 Hypo-osmolality and hyponatremia: Secondary | ICD-10-CM | POA: Diagnosis not present

## 2018-06-02 DIAGNOSIS — D61818 Other pancytopenia: Secondary | ICD-10-CM | POA: Diagnosis not present

## 2018-06-02 DIAGNOSIS — C8218 Follicular lymphoma grade II, lymph nodes of multiple sites: Secondary | ICD-10-CM | POA: Diagnosis not present

## 2018-06-02 DIAGNOSIS — Z6831 Body mass index (BMI) 31.0-31.9, adult: Secondary | ICD-10-CM

## 2018-06-02 DIAGNOSIS — B179 Acute viral hepatitis, unspecified: Secondary | ICD-10-CM | POA: Diagnosis not present

## 2018-06-02 DIAGNOSIS — E669 Obesity, unspecified: Secondary | ICD-10-CM | POA: Diagnosis not present

## 2018-06-02 DIAGNOSIS — R740 Nonspecific elevation of levels of transaminase and lactic acid dehydrogenase [LDH]: Secondary | ICD-10-CM | POA: Diagnosis not present

## 2018-06-02 DIAGNOSIS — K7200 Acute and subacute hepatic failure without coma: Secondary | ICD-10-CM | POA: Diagnosis not present

## 2018-06-02 DIAGNOSIS — B169 Acute hepatitis B without delta-agent and without hepatic coma: Secondary | ICD-10-CM | POA: Diagnosis not present

## 2018-06-02 DIAGNOSIS — Z79899 Other long term (current) drug therapy: Secondary | ICD-10-CM

## 2018-06-02 DIAGNOSIS — F410 Panic disorder [episodic paroxysmal anxiety] without agoraphobia: Secondary | ICD-10-CM | POA: Diagnosis not present

## 2018-06-02 DIAGNOSIS — Z880 Allergy status to penicillin: Secondary | ICD-10-CM

## 2018-06-02 DIAGNOSIS — F4310 Post-traumatic stress disorder, unspecified: Secondary | ICD-10-CM | POA: Diagnosis not present

## 2018-06-02 DIAGNOSIS — Z9221 Personal history of antineoplastic chemotherapy: Secondary | ICD-10-CM

## 2018-06-02 DIAGNOSIS — R162 Hepatomegaly with splenomegaly, not elsewhere classified: Secondary | ICD-10-CM | POA: Diagnosis present

## 2018-06-02 DIAGNOSIS — E876 Hypokalemia: Secondary | ICD-10-CM | POA: Diagnosis not present

## 2018-06-02 DIAGNOSIS — F329 Major depressive disorder, single episode, unspecified: Secondary | ICD-10-CM

## 2018-06-02 DIAGNOSIS — Z79891 Long term (current) use of opiate analgesic: Secondary | ICD-10-CM

## 2018-06-02 DIAGNOSIS — F323 Major depressive disorder, single episode, severe with psychotic features: Secondary | ICD-10-CM | POA: Diagnosis present

## 2018-06-02 DIAGNOSIS — R17 Unspecified jaundice: Secondary | ICD-10-CM | POA: Diagnosis not present

## 2018-06-02 DIAGNOSIS — B181 Chronic viral hepatitis B without delta-agent: Secondary | ICD-10-CM | POA: Diagnosis not present

## 2018-06-02 DIAGNOSIS — K219 Gastro-esophageal reflux disease without esophagitis: Secondary | ICD-10-CM | POA: Diagnosis present

## 2018-06-02 DIAGNOSIS — R197 Diarrhea, unspecified: Secondary | ICD-10-CM | POA: Diagnosis present

## 2018-06-02 DIAGNOSIS — F32A Depression, unspecified: Secondary | ICD-10-CM

## 2018-06-02 LAB — CBC WITH DIFFERENTIAL/PLATELET
Abs Immature Granulocytes: 0.06 10*3/uL (ref 0.00–0.07)
Basophils Absolute: 0 10*3/uL (ref 0.0–0.1)
Basophils Relative: 1 %
Eosinophils Absolute: 0 10*3/uL (ref 0.0–0.5)
Eosinophils Relative: 1 %
HCT: 36.4 % (ref 36.0–46.0)
Hemoglobin: 12.1 g/dL (ref 12.0–15.0)
Immature Granulocytes: 2 %
Lymphocytes Relative: 24 %
Lymphs Abs: 0.8 10*3/uL (ref 0.7–4.0)
MCH: 30.9 pg (ref 26.0–34.0)
MCHC: 33.2 g/dL (ref 30.0–36.0)
MCV: 92.9 fL (ref 80.0–100.0)
Monocytes Absolute: 0.5 10*3/uL (ref 0.1–1.0)
Monocytes Relative: 15 %
Neutro Abs: 1.9 10*3/uL (ref 1.7–7.7)
Neutrophils Relative %: 57 %
Platelets: 150 10*3/uL (ref 150–400)
RBC: 3.92 MIL/uL (ref 3.87–5.11)
RDW: 16.1 % — ABNORMAL HIGH (ref 11.5–15.5)
WBC Morphology: INCREASED
WBC: 3.2 10*3/uL — ABNORMAL LOW (ref 4.0–10.5)
nRBC: 0 % (ref 0.0–0.2)

## 2018-06-02 LAB — COMPREHENSIVE METABOLIC PANEL
ALT: 737 U/L — ABNORMAL HIGH (ref 0–44)
AST: 1049 U/L — ABNORMAL HIGH (ref 15–41)
Albumin: 2.9 g/dL — ABNORMAL LOW (ref 3.5–5.0)
Alkaline Phosphatase: 217 U/L — ABNORMAL HIGH (ref 38–126)
Anion gap: 8 (ref 5–15)
BUN: 14 mg/dL (ref 8–23)
CO2: 24 mmol/L (ref 22–32)
Calcium: 7.9 mg/dL — ABNORMAL LOW (ref 8.9–10.3)
Chloride: 106 mmol/L (ref 98–111)
Creatinine, Ser: 0.79 mg/dL (ref 0.44–1.00)
GFR calc Af Amer: >60 mL/min (ref >60)
GFR calc non Af Amer: >60 mL/min (ref >60)
Glucose, Bld: 110 mg/dL — ABNORMAL HIGH (ref 70–99)
Potassium: 3.3 mmol/L — ABNORMAL LOW (ref 3.5–5.1)
Sodium: 138 mmol/L (ref 135–145)
Total Bilirubin: 8.8 mg/dL — ABNORMAL HIGH (ref 0.3–1.2)
Total Protein: 7.3 g/dL (ref 6.5–8.1)

## 2018-06-02 LAB — URINALYSIS, ROUTINE W REFLEX MICROSCOPIC
Bacteria, UA: NONE SEEN
Glucose, UA: NEGATIVE mg/dL
Hgb urine dipstick: NEGATIVE
Ketones, ur: NEGATIVE mg/dL
Leukocytes,Ua: NEGATIVE
Nitrite: NEGATIVE
Protein, ur: 30 mg/dL — AB
Specific Gravity, Urine: 1.025 (ref 1.005–1.030)
pH: 5 (ref 5.0–8.0)

## 2018-06-02 LAB — ACETAMINOPHEN LEVEL: Acetaminophen (Tylenol), Serum: <10 ug/mL — ABNORMAL LOW (ref 10–30)

## 2018-06-02 LAB — PROTIME-INR
INR: 1.2 (ref 0.8–1.2)
Prothrombin Time: 14.8 seconds (ref 11.4–15.2)

## 2018-06-02 LAB — PHOSPHORUS: Phosphorus: 2.5 mg/dL (ref 2.5–4.6)

## 2018-06-02 LAB — MAGNESIUM: Magnesium: 1.9 mg/dL (ref 1.7–2.4)

## 2018-06-02 LAB — LIPASE, BLOOD: Lipase: 37 U/L (ref 11–51)

## 2018-06-02 LAB — BILIRUBIN, FRACTIONATED(TOT/DIR/INDIR)
Bilirubin, Direct: 5.7 mg/dL — ABNORMAL HIGH (ref 0.0–0.2)
Indirect Bilirubin: 3.2 mg/dL — ABNORMAL HIGH (ref 0.3–0.9)
Total Bilirubin: 8.9 mg/dL — ABNORMAL HIGH (ref 0.3–1.2)

## 2018-06-02 LAB — AMMONIA: Ammonia: 33 umol/L (ref 9–35)

## 2018-06-02 MED ORDER — POTASSIUM CHLORIDE 20 MEQ/15ML (10%) PO SOLN
40.0000 meq | Freq: Once | ORAL | Status: AC
  Administered 2018-06-02: 40 meq via ORAL
  Filled 2018-06-02: qty 30

## 2018-06-02 MED ORDER — SODIUM CHLORIDE 0.9 % IV BOLUS
500.0000 mL | Freq: Once | INTRAVENOUS | Status: AC
  Administered 2018-06-02: 500 mL via INTRAVENOUS

## 2018-06-02 MED ORDER — SODIUM CHLORIDE 0.9 % IV SOLN
INTRAVENOUS | Status: AC
  Administered 2018-06-02: 21:00:00 via INTRAVENOUS

## 2018-06-02 MED ORDER — ONDANSETRON HCL 4 MG PO TABS: 4.0000 mg | ORAL_TABLET | Freq: Four times a day (QID) | ORAL | Status: DC | PRN

## 2018-06-02 MED ORDER — RISPERIDONE 0.25 MG PO TABS
0.5000 mg | ORAL_TABLET | Freq: Every day | ORAL | Status: DC
  Administered 2018-06-02 – 2018-06-11 (×10): 0.5 mg via ORAL
  Filled 2018-06-02 (×10): qty 2

## 2018-06-02 MED ORDER — ONDANSETRON HCL 4 MG/2ML IJ SOLN
4.0000 mg | Freq: Four times a day (QID) | INTRAMUSCULAR | Status: DC | PRN
  Administered 2018-06-02 – 2018-06-06 (×3): 4 mg via INTRAVENOUS
  Filled 2018-06-02 (×3): qty 2

## 2018-06-02 NOTE — ED Triage Notes (Signed)
States for one month vomiting and diarrhea voiced and states she wants to sleep a lot with no pain voiced states went to doctor yesterday for blood work and [facility] was called by son and told her to come to ER for evaluation.

## 2018-06-02 NOTE — ED Provider Notes (Signed)
[facility] DEPT Provider Note   CSN: [id number] Arrival date & time: 06/02/18  1522    History   Chief Complaint Chief Complaint  Patient presents with  . Emesis    diarrhea    HPI Brooke Huber is a 73 y.o. female.     The history is provided by the patient and medical records. No language interpreter was used.  Emesis   Brooke Huber is a 73 y.o. female who presents to the Emergency Department complaining of abnormal lab. She presents to the emergency department at the urging of her physician for lab abnormality. She complains of one and a half months of vomiting and diarrhea, one episode of each daily. She also reports feeling unwell. She had an ED visit with her physician with [facility] physicians yesterday and had an outpatient lab draw yesterday. She was instructed to come to the emergency department because her liver functions were very high. She lives alone. She denies any alcohol or drug use.  Her only current medication is sertraline.  She does have a history of lymphoma but is not currently undergoing any treatment. She has taken two tablets of acetaminophen once yesterday and once today due to feeling unwell. She denies any fevers, chest pain, abdominal pain, dysuria. No prior similar symptoms. Past Medical History:  Diagnosis Date  . 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 Cancer [facility])    breast lymphoma with mets  . Depression   . GERD (gastroesophageal reflux disease)   . Hepatitis   . Hepatitis B 02/1995  . Seizures ([place])    hx of seizure in 1993 and 1994 - caused by stress per patient     Patient Active Problem List   Diagnosis Date Noted  . Vaccine counseling 10/17/2017  . Medication monitoring encounter 07/17/2017  . 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 Closed displaced oblique fracture of shaft of right humerus 05/26/2017  . 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 MDD (major depressive disorder), recurrent, severe, with psychosis ([place]) 06/09/2014  . PTSD (post-traumatic stress disorder) 06/09/2014  . Panic disorder 06/09/2014  . Bereavement 06/09/2014  . Hypokalemia 06/09/2014  . History of periportal fibrosis 08/31/2006    Past Surgical History:  Procedure Laterality Date  . APPENDECTOMY    . COLONOSCOPY WITH PROPOFOL N/A 12/29/2015   Procedure: COLONOSCOPY WITH PROPOFOL;  Surgeon: Garlan Fair, MD;  Location: [facility] ENDOSCOPY;  Service: Endoscopy;  Laterality: N/A;  . TONSILLECTOMY AND ADENOIDECTOMY Bilateral 1954  . TUBAL LIGATION       OB History   No obstetric history on file.      Home Medications    Prior to Admission medications   Medication Sig Start Date End Date Taking? Authorizing Provider  acyclovir (ZOVIRAX) 400 MG tablet Take 1 tablet (400 mg total) by mouth daily. 06/30/17   Brunetta Genera, MD  Calcium Carb-Cholecalciferol 600-100 MG-UNIT CAPS Take 1 tablet by mouth daily.    [provider]  calcium-vitamin D (OSCAL WITH D) 500-200 MG-UNIT tablet Take 1 tablet by mouth 3 (three) times daily. 07/04/17   Leandrew Koyanagi, MD  clotrimazole (LOTRIMIN) 1 % cream Apply 1 application topically 2 (two) times daily. 09/28/17   Brunetta Genera, MD  dexamethasone (DECADRON) 4 MG tablet Take 2 tablets (8 mg total) by mouth daily. Start the day after bendamustine chemotherapy for 2 days. Take with food.  06/30/17   Brunetta Genera, MD  HYDROcodone-acetaminophen (NORCO/VICODIN) 5-325 MG tablet TK 1 T PO  Q 4 H PRN P 05/19/17   [provider]  LORazepam (ATIVAN) 0.5 MG tablet Take 1 tablet (0.5 mg total) by mouth every 6 (six) hours as needed (Nausea or vomiting). 06/30/17   Brunetta Genera, MD  ondansetron (ZOFRAN) 8 MG tablet Take 1 tablet (8 mg total) by mouth 2 (two) times daily as needed for refractory nausea / vomiting. Start on day 2 after bendamustine chemo. 06/30/17    Brunetta Genera, MD  prochlorperazine (COMPAZINE) 10 MG tablet Take 1 tablet (10 mg total) by mouth every 6 (six) hours as needed (Nausea or vomiting). 06/30/17   Brunetta Genera, MD  risperiDONE (RISPERDAL) 0.5 MG tablet Take 1 tablet (0.5 mg total) by mouth at bedtime. 12/21/17 12/21/18  Charlcie Cradle, MD  sertraline (ZOLOFT) 50 MG tablet Take 1 tablet (50 mg total) by mouth daily. 12/21/17 12/21/18  Charlcie Cradle, MD  Tenofovir Alafenamide Fumarate 25 MG TABS Take 1 tablet (25 mg total) by mouth daily. 10/17/17   Comer, Okey Regal, MD  vitamin C (ASCORBIC ACID) 500 MG tablet Take 500 mg by mouth daily.    [provider]    Family History Family History  Problem Relation Age of Onset  . Alzheimer's disease Sister   . Dementia Sister   . Sexual abuse Sister   . Seizures Sister   . Alcohol abuse Brother   . Alcohol abuse Maternal Uncle   . Drug abuse Cousin   . Drug abuse Son   . Depression Neg Hx     Social History Social History   Tobacco Use  . Smoking status: Never Smoker  . Smokeless tobacco: Never Used  Substance Use Topics  . Alcohol use: No  . Drug use: No     Allergies   Penicillins   Review of Systems Review of Systems  Gastrointestinal: Positive for vomiting.  All other systems reviewed and are negative.    Physical Exam Updated Vital Signs BP (!) 123/58 (BP Location: Right Arm)   Pulse (!) 101   Temp 98.6 F (37 C) (Oral)   Resp 18   Ht 5\' 2"  (1.575 m)   Wt 78 kg   SpO2 96%   BMI 31.46 kg/m   Physical Exam Vitals signs and nursing note reviewed.  Constitutional:      Appearance: She is well-developed.  HENT:     Head: Normocephalic and atraumatic.  Eyes:     General: Scleral icterus present.  Cardiovascular:     Rate and Rhythm: Normal rate and regular rhythm.     Heart sounds: No murmur.  Pulmonary:     Effort: Pulmonary effort is normal. No respiratory distress.     Breath sounds: Normal breath sounds.   Abdominal:     Palpations: Abdomen is soft.     Tenderness: There is no abdominal tenderness. There is no guarding or rebound.  Musculoskeletal:        General: No swelling or tenderness.  Skin:    General: Skin is warm and dry.     Capillary Refill: Capillary refill takes less than 2 seconds.  Neurological:     Mental Status: She is alert and oriented to person, place, and time.  Psychiatric:        Mood and Affect: Mood normal.        Behavior: Behavior normal.      ED Treatments /  Results  Labs (all labs ordered are listed, but only abnormal results are displayed) Labs Reviewed - No data to display  EKG None  Radiology No results found.  Procedures Procedures (including critical care time)  Medications Ordered in ED Medications  sodium chloride 0.9 % bolus 500 mL (has no administration in time range)     Initial Impression / Assessment and Plan / ED Course  I have reviewed the triage vital signs and the nursing notes.  Pertinent labs & imaging results that were available during my care of the patient were reviewed by me and considered in my medical decision making (see chart for details).       Pt with history of follicular lymphoma as well as hepatitis B infection here for evaluation of vomiting, diarrhea and abnormal liver enzymes. She is non-toxic appearing on evaluation. Labs are significant for elevation in her transaminases compared to April 15. She has no significant abdominal tenderness on examination. Ultrasound is negative for obstruction. Hospitalist consulted for admission. Discussed with Dr. Alessandra Bevels with [facility] He recommends checking hepatitis B as well as hepatitis C panels. He will see the patient and consult in the morning. Patient updated of findings of studies and recommendation for admission and she is in agreement with treatment plan.  Final Clinical Impressions(s) / ED Diagnoses   Final diagnoses:  None    ED Discharge Orders     None       Quintella Reichert, MD 06/02/18 1929

## 2018-06-02 NOTE — ED Notes (Signed)
Bed: JK82 Expected date:  Expected time:  Means of arrival:  Comments: triage

## 2018-06-02 NOTE — H&P (Signed)
History and Physical    Brooke Huber SVX:[id number] DOB: [birth year] DOA: 06/02/2018  PCP: Alroy Dust, L.Marlou Sa, MD  Patient coming from: Home  I have personally briefly reviewed patient's old medical records in [place]  Chief Complaint: Nausea, lethargy, abnormal labs  HPI: Brooke Huber is a 73 y.o. female with medical history significant for Stage IV follicular lymphoma s/p chemotherapy, chronic hepatitis B, and MDD with psychosis, PTSD, and panic disorder who presents to the ED for evaluation of abnormal liver function labs drawn by her PCP on 06/01/2018.  Patient states that since 05/23/2018 she has been feeling lethargic and "icky" with frequent nausea but no significant emesis.  She reports soft stools occurring every other day with yellow discoloration.  She says her family members have told her that her skin looks more yellow than usual.  She took extra strength Tylenol, 2 tabs today and 2 tabs yesterday but otherwise denies significant Tylenol use.  She says the only other medications she takes are Risperdal and sertraline.  She denies any alcohol use or tobacco use.  She denies any illicit drug use.  She denies any associated abdominal pain, confusion, chest pain, dyspnea, swelling, fevers, chills, or diaphoresis.  She has not had any sick contacts or recent travel.  She denies any history of known liver disease in her family.  She has a history of chronic hepatitis B.  She had been receiving chemotherapy for her follicular lymphoma at which time she was treated with tenofovir in order to prevent remission of her hep B.  She completed chemotherapy in September 2019 with 3 cycles of bendamustine/Rituxan followed by 2 cycles of only Rituxan (due to neutropenia).  Her hepatitis B viral load was successfully suppressed with tenofovir which was subsequently discontinued in December 2019 after completion of chemotherapy.  Labs 05/16/2018: AST 54, ALT 53, alk phos 83, total  bilirubin 0.5.  ED Course:  Initial vitals showed BP 123/58, pulse 101, RR 18, temp 98.6 Fahrenheit, SPO2 96% on room air.  Labs are notable for AST 1049, ALT 737, alk phos 217, total bilirubin 8.8, K3.3, sodium 138, BUN 14, creatinine 0.79, WBC 3.2, hemoglobin 12.1, platelets 150, INR 1.2, lipase 37, urinalysis negative for UTI.  Abdominal ultrasound showed splenomegaly and increased hepatic echogenicity as can been seen with hepatic steatosis.  A 2.6 x 3.4 x 2.7 cm anechoic right hepatic mass consistent with a cyst was seen as well as a smaller 10 mm left hepatic cyst.  Portal vein was patent on color Doppler with normal blood flow toward the liver.  The gallbladder was not visualized secondary to overlying bowel gas versus decompressed gallbladder.  CBD diameter 3.4 mm.  The patient was given 500 mL normal saline.  The EDP, Dr. Ralene Bathe, discussed the case with on-call GI Dr. Alessandra Bevels who will see the patient in consult in the morning.  The hospitalist service was consulted to admit for further evaluation and management.  Review of Systems: As per HPI otherwise 10 point review of systems negative.    Past Medical History:  Diagnosis Date  . 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 Cancer [facility])    breast lymphoma with mets  . Depression   . GERD (gastroesophageal reflux disease)   . Hepatitis   . Hepatitis B 02/1995  . Seizures ([place])    hx of seizure in 1993 and 1994 - caused by stress per patient     Past Surgical History:  Procedure Laterality Date  .  APPENDECTOMY    . COLONOSCOPY WITH PROPOFOL N/A 12/29/2015   Procedure: COLONOSCOPY WITH PROPOFOL;  Surgeon: Garlan Fair, MD;  [facility];  Service: Endoscopy;  Laterality: N/A;  . TONSILLECTOMY AND ADENOIDECTOMY Bilateral 1954  . TUBAL LIGATION       reports that she has never smoked. She has never used smokeless tobacco. She reports that she does not drink alcohol or use drugs.  Allergies  Allergen  Reactions  . Penicillins Hives and Rash    Did it involve swelling of the face/tongue/throat, SOB, or low BP? N Did it involve sudden or severe rash/hives, skin peeling, or any reaction on the inside of your mouth or nose? Y Did you need to seek medical attention at a hospital or doctor's office? N When did it last happen? If all above answers are "NO", may proceed with cephalosporin use.     Family History  Problem Relation Age of Onset  . Alzheimer's disease Sister   . Dementia Sister   . Sexual abuse Sister   . Seizures Sister   . Alcohol abuse Brother   . Alcohol abuse Maternal Uncle   . Drug abuse Cousin   . Drug abuse Son   . Depression Neg Hx      Prior to Admission medications   Medication Sig Start Date End Date Taking? Authorizing Provider  Calcium Carb-Cholecalciferol 600-100 MG-UNIT CAPS Take 1 tablet by mouth daily.   Yes [provider]  LORazepam (ATIVAN) 0.5 MG tablet Take 1 tablet (0.5 mg total) by mouth every 6 (six) hours as needed (Nausea or vomiting). 06/30/17  Yes Brunetta Genera, MD  ondansetron (ZOFRAN) 8 MG tablet Take 1 tablet (8 mg total) by mouth 2 (two) times daily as needed for refractory nausea / vomiting. Start on day 2 after bendamustine chemo. 06/30/17  Yes Brunetta Genera, MD  prochlorperazine (COMPAZINE) 10 MG tablet Take 1 tablet (10 mg total) by mouth every 6 (six) hours as needed (Nausea or vomiting). 06/30/17  Yes Brunetta Genera, MD  risperiDONE (RISPERDAL) 0.5 MG tablet Take 1 tablet (0.5 mg total) by mouth at bedtime. 12/21/17 12/21/18 Yes Charlcie Cradle, MD  sertraline (ZOLOFT) 50 MG tablet Take 1 tablet (50 mg total) by mouth daily. 12/21/17 12/21/18 Yes Charlcie Cradle, MD  acyclovir (ZOVIRAX) 400 MG tablet Take 1 tablet (400 mg total) by mouth daily. Patient not taking: Reported on 06/02/2018 06/30/17   Brunetta Genera, MD  calcium-vitamin D (OSCAL WITH D) 500-200 MG-UNIT tablet Take 1 tablet by mouth 3  (three) times daily. Patient not taking: Reported on 06/02/2018 07/04/17   Leandrew Koyanagi, MD  clotrimazole (LOTRIMIN) 1 % cream Apply 1 application topically 2 (two) times daily. Patient not taking: Reported on 06/02/2018 09/28/17   Brunetta Genera, MD  dexamethasone (DECADRON) 4 MG tablet Take 2 tablets (8 mg total) by mouth daily. Start the day after bendamustine chemotherapy for 2 days. Take with food. Patient not taking: Reported on 06/02/2018 06/30/17   Brunetta Genera, MD  Tenofovir Alafenamide Fumarate 25 MG TABS Take 1 tablet (25 mg total) by mouth daily. Patient not taking: Reported on 06/02/2018 10/17/17   Thayer Headings, MD    Physical Exam: Vitals:   06/02/18 1529 06/02/18 1533 06/02/18 1827 06/02/18 2050  BP: (!) 123/58  137/89 121/70  Pulse: (!) 101  60 (!) 57  Resp: 18  16 20   Temp: 98.6 F (37 C)  98.6 F (37 C) 98.4 F (  36.9 C)  TempSrc: Oral  Oral Oral  SpO2: 96%  97% 97%  Weight:  78 kg    Height:  5' 2"  (1.575 m)      Constitutional: Elderly woman resting supine in bed, NAD, calm, comfortable Eyes: PERRL, EOMI, scleral icterus present ENMT: Mucous membranes are moist. Posterior pharynx clear of any exudate or lesions.Normal dentition.  Neck: normal, supple, no masses. Respiratory: clear to auscultation bilaterally, no wheezing, no crackles. Normal respiratory effort. No accessory muscle use.  Cardiovascular: Regular rate and rhythm, no murmurs / rubs / gallops. No extremity edema. 2+ pedal pulses. Abdomen: no tenderness, no masses palpated. No hepatosplenomegaly. Bowel sounds positive.  Musculoskeletal: no clubbing / cyanosis. No joint deformity upper and lower extremities. Good ROM, no contractures. Normal muscle tone.  Skin: Slight jaundiced appearance, no rashes, lesions, ulcers. No induration Neurologic: CN 2-12 grossly intact. Sensation intact, Strength 5/5 in all 4.  No asterixis or dysmetria. Psychiatric: Normal judgment and insight. Alert and oriented x  3. Normal mood.     Labs on Admission: I have personally reviewed following labs and imaging studies  CBC: Recent Labs  Lab 06/02/18 1615  WBC 3.2*  NEUTROABS 1.9  HGB 12.1  HCT 36.4  MCV 92.9  PLT 371   Basic Metabolic Panel: Recent Labs  Lab 06/02/18 1615  NA 138  K 3.3*  CL 106  CO2 24  GLUCOSE 110*  BUN 14  CREATININE 0.79  CALCIUM 7.9*   GFR: Estimated Creatinine Clearance: 60.6 mL/min (by C-G formula based on SCr of 0.79 mg/dL). Liver Function Tests: Recent Labs  Lab 06/02/18 1615  AST 1,049*  ALT 737*  ALKPHOS 217*  BILITOT 8.8*  PROT 7.3  ALBUMIN 2.9*   Recent Labs  Lab 06/02/18 1615  LIPASE 37   No results for input(s): AMMONIA in the last 168 hours. Coagulation Profile: Recent Labs  Lab 06/02/18 1615  INR 1.2   Cardiac Enzymes: No results for input(s): CKTOTAL, CKMB, CKMBINDEX, TROPONINI in the last 168 hours. BNP (last 3 results) No results for input(s): PROBNP in the last 8760 hours. HbA1C: No results for input(s): HGBA1C in the last 72 hours. CBG: No results for input(s): GLUCAP in the last 168 hours. Lipid Profile: No results for input(s): CHOL, HDL, LDLCALC, TRIG, CHOLHDL, LDLDIRECT in the last 72 hours. Thyroid Function Tests: No results for input(s): TSH, T4TOTAL, FREET4, T3FREE, THYROIDAB in the last 72 hours. Anemia Panel: No results for input(s): VITAMINB12, FOLATE, FERRITIN, TIBC, IRON, RETICCTPCT in the last 72 hours. Urine analysis:    Component Value Date/Time   COLORURINE AMBER (A) 06/02/2018 1615   APPEARANCEUR CLEAR 06/02/2018 1615   LABSPEC 1.025 06/02/2018 1615   PHURINE 5.0 06/02/2018 1615   GLUCOSEU NEGATIVE 06/02/2018 1615   HGBUR NEGATIVE 06/02/2018 1615   BILIRUBINUR MODERATE (A) 06/02/2018 1615   KETONESUR NEGATIVE 06/02/2018 1615   PROTEINUR 30 (A) 06/02/2018 1615   UROBILINOGEN 1.0 06/08/2014 1559   NITRITE NEGATIVE 06/02/2018 1615   LEUKOCYTESUR NEGATIVE 06/02/2018 1615    Radiological Exams on  Admission: US Abdomen Complete  Result Date: 06/02/2018 CLINICAL DATA:  Transaminitis.  Biliary obstruction. EXAM: ABDOMEN ULTRASOUND COMPLETE COMPARISON:  None. FINDINGS: Gallbladder: Gallbladder not visualized secondary to overlying bowel gas versus decompressed gallbladder. Common bile duct: Diameter: 3.4 mm Liver: 2.6 x 3.4 x 2.7 cm anechoic right hepatic mass consistent with a cyst. Smaller 10 mm left hepatic cyst. Increased hepatic parenchymal echogenicity. Portal vein is patent on color Doppler imaging with normal  direction of blood flow towards the liver. IVC: No abnormality visualized. Pancreas: Visualized portion unremarkable. Spleen: Spleen is enlarged measuring 13.6 cm in length with a volume of 469 mL. No focal splenic lesion. Right Kidney: Length: 9.4 cm. Echogenicity within normal limits. No mass or hydronephrosis visualized. Left Kidney: Length: 9.6 cm. Echogenicity within normal limits. No mass or hydronephrosis visualized. Abdominal aorta: Abdominal aorta measures 2.8 cm. Other findings: None IMPRESSION: 1. Gallbladder not visualized likely secondary to a contracted state. 2. Splenomegaly. 3. Increased hepatic echogenicity as can be seen with hepatic steatosis. 4. Ectatic abdominal aorta, at risk for aneurysm development. Recommend follow-up aortic ultrasound in 5 years. This recommendation follows ACR consensus guidelines: White Paper of the ACR Incidental Findings Committee II on Vascular Findings. J Am Coll Radiol 2013; 10:789-794. Electronically Signed   By: Kathreen Devoid   On: 06/02/2018 18:30    EKG: Not obtained.  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 female with medical history significant for Stage IV follicular lymphoma s/p chemotherapy, chronic hepatitis B,  and MDD with psychosis, PTSD, and panic disorder who is admitted with acute liver dysfunction.   Acute hepatitis: Chronic hepatitis B: LFTs significantly elevated compared to relatively normal labs on 05/16/2018.  Abdominal ultrasound showed normal portal venous flow, CBD diameter 3.4 mm.  Gallbladder was unable to be visualized due to overlying bowel versus decompressed gallbladder.  Viral hepatitis is suspected.  Patient currently alert and oriented without abdominal pain. -Check hepatitis A, B, and C studies.  Hepatitis D also a possibility with known hep B -this is a send out study -Check HIV Ab, APAP level, ammonia -Monitor BMP, hepatic function panel, and INR -Continue supportive IV fluids -GI consulted by EDP, will see in a.m.  Hypokalemia: K3.3, repleted and recheck in a.m.  Stage IV follicular lymphoma: Follows with oncology Dr. Irene Limbo. Status post chemotherapy (completed 10/2017) with complete metabolic response per last oncology note 05/23/2018.  CT chest/abdomen/pelvis 05/17/2018 showed no residual or recurrent lymphadenopathy.  MDD with psychosis/PTSD/panic disorder: Follows with behavioral health, currently stable on home regimen of Zoloft 50 mg daily and Risperdal 0.5 mg nightly. -Continue Risperdal -Hold Zoloft for now given liver dysfunction  Ectatic abdominal aorta: Seen on abdominal ultrasound, abdominal aorta measures 2.8 cm.  Follow-up aortic ultrasound in 5 years recommended.  DVT prophylaxis: SCDs Code Status: Full code, confirmed with patient Family Communication: Plan discussed with patient, no family present on admission. Disposition Plan: Pending clinical progress Consults called: GI consulted by EDP, to see in a.m. Admission status: Inpatient, patient likely requires greater than 2 midnight length stay due to acute liver dysfunction with risk for decompensation into fulminant liver failure requiring close monitoring and further work-up.   Zada Finders MD [facility] Pager [phone]  If 7PM-7AM, please contact night-coverage [email or web address]  06/02/2018, 8:51 PM

## 2018-06-02 NOTE — ED Notes (Signed)
Phone report given to [place], [place]

## 2018-06-03 DIAGNOSIS — E876 Hypokalemia: Secondary | ICD-10-CM

## 2018-06-03 LAB — HEPATIC FUNCTION PANEL
ALT: 646 U/L — ABNORMAL HIGH (ref 0–44)
AST: 932 U/L — ABNORMAL HIGH (ref 15–41)
Albumin: 2.4 g/dL — ABNORMAL LOW (ref 3.5–5.0)
Alkaline Phosphatase: 169 U/L — ABNORMAL HIGH (ref 38–126)
Bilirubin, Direct: 5.2 mg/dL — ABNORMAL HIGH (ref 0.0–0.2)
Indirect Bilirubin: 3.6 mg/dL — ABNORMAL HIGH (ref 0.3–0.9)
Total Bilirubin: 8.8 mg/dL — ABNORMAL HIGH (ref 0.3–1.2)
Total Protein: 6.1 g/dL — ABNORMAL LOW (ref 6.5–8.1)

## 2018-06-03 LAB — BASIC METABOLIC PANEL
Anion gap: <3 — ABNORMAL LOW (ref 5–15)
BUN: 12 mg/dL (ref 8–23)
CO2: 25 mmol/L (ref 22–32)
Calcium: 7.6 mg/dL — ABNORMAL LOW (ref 8.9–10.3)
Chloride: 113 mmol/L — ABNORMAL HIGH (ref 98–111)
Creatinine, Ser: 0.67 mg/dL (ref 0.44–1.00)
GFR calc Af Amer: >60 mL/min (ref >60)
GFR calc non Af Amer: >60 mL/min (ref >60)
Glucose, Bld: 85 mg/dL (ref 70–99)
Potassium: 4.1 mmol/L (ref 3.5–5.1)
Sodium: 140 mmol/L (ref 135–145)

## 2018-06-03 LAB — CBC
HCT: 31.7 % — ABNORMAL LOW (ref 36.0–46.0)
Hemoglobin: 10 g/dL — ABNORMAL LOW (ref 12.0–15.0)
MCH: 30.3 pg (ref 26.0–34.0)
MCHC: 31.5 g/dL (ref 30.0–36.0)
MCV: 96.1 fL (ref 80.0–100.0)
Platelets: 112 10*3/uL — ABNORMAL LOW (ref 150–400)
RBC: 3.3 MIL/uL — ABNORMAL LOW (ref 3.87–5.11)
RDW: 16.6 % — ABNORMAL HIGH (ref 11.5–15.5)
WBC: 2.9 10*3/uL — ABNORMAL LOW (ref 4.0–10.5)
nRBC: 0 % (ref 0.0–0.2)

## 2018-06-03 LAB — PROTIME-INR
INR: 1.3 — ABNORMAL HIGH (ref 0.8–1.2)
Prothrombin Time: 15.9 seconds — ABNORMAL HIGH (ref 11.4–15.2)

## 2018-06-03 LAB — HIV ANTIBODY (ROUTINE TESTING W REFLEX): HIV Screen 4th Generation wRfx: NONREACTIVE

## 2018-06-03 NOTE — Progress Notes (Signed)
PROGRESS NOTE    Brooke Huber  SNK:[id number] DOB: 1945/08/25 DOA: 06/02/2018 PCP: Alroy Dust, L.Marlou Sa, MD   Brief Narrative: Brooke Huber is a 73 y.o. female with medical history significant for Stage IV follicular lymphoma s/p chemotherapy, chronic hepatitis B, and MDD with psychosis, PTSD, and panic disorder. Here with acute hepatitis.   Assessment & Plan:   Principal Problem:   Acute hepatitis Active Problems:   Hypokalemia   Chronic viral hepatitis B without delta-agent (HCC)   Follicular lymphoma grade II of lymph nodes of multiple sites ([place])   Depression   Acute hepatitis Presumed viral. AST/ALT trended down slightly. Bilirubin also down. GI consulted. Normal ammonia. No confusion. INR elevated. APAP level undetectable. -GI recommendations pending -HIV in process  Hypokalemia Resolved with repletion  Stage IV follicular lymphoma Follows with Dr. Irene Limbo. S/p chemotherapy.  Major depression w/ psychosis PTSD/panic disorder Stable. On zoloft and Risperdal -Continue Risperdal -Holding Zoloft secondary to liver dysfunction  Ectatic abdominal aorta 2.8 cm. Follow-up ultrasound in 5 years   DVT prophylaxis: SCDs Code Status:   Code Status: Full Code Family Communication: None Disposition Plan: Discharge pending improvement of liver enzymes and GI recommendations   Consultants:   [facility]  Procedures:   None  Antimicrobials:  None    Subjective: Feels weak. Otherwise, no concerns today.  Objective: Vitals:   06/02/18 1533 06/02/18 1827 06/02/18 2050 06/03/18 0609  BP:  137/89 121/70 135/79  Pulse:  60 (!) 57 (!) 55  Resp:  16 20 18   Temp:  98.6 F (37 C) 98.4 F (36.9 C) 98.2 F (36.8 C)  TempSrc:  Oral Oral Oral  SpO2:  97% 97% 96%  Weight: 78 kg     Height: 5\' 2"  (1.575 m)       Intake/Output Summary (Last 24 hours) at 06/03/2018 1040 Last data filed at 06/03/2018 0619 Gross per 24 hour  Intake 1580 ml  Output 300 ml  Net  1280 ml   Filed Weights   06/02/18 1533  Weight: 78 kg    Examination:  General exam: Appears calm and comfortable Respiratory system: Clear to auscultation. Respiratory effort normal. Cardiovascular system: S1 & S2 heard, RRR. No murmurs, rubs, gallops or clicks. Gastrointestinal system: Abdomen is nondistended, soft and nontender. No organomegaly or masses felt. Normal bowel sounds heard. Central nervous system: Alert and oriented. No focal neurological deficits. Extremities: No edema. No calf tenderness Skin: No cyanosis. No rashes. Jaundice Psychiatry: Judgement and insight appear normal. Mood & affect appropriate.     Data Reviewed: I have personally reviewed following labs and imaging studies  CBC: Recent Labs  Lab 06/02/18 1615 06/03/18 0341  WBC 3.2* 2.9*  NEUTROABS 1.9  --   HGB 12.1 10.0*  HCT 36.4 31.7*  MCV 92.9 96.1  PLT 150 937*   Basic Metabolic Panel: Recent Labs  Lab 06/02/18 1615 06/02/18 2223 06/03/18 0341  NA 138  --  140  K 3.3*  --  4.1  CL 106  --  113*  CO2 24  --  25  GLUCOSE 110*  --  85  BUN 14  --  12  CREATININE 0.79  --  0.67  CALCIUM 7.9*  --  7.6*  MG  --  1.9  --   PHOS  --  2.5  --    GFR: Estimated Creatinine Clearance: 60.6 mL/min (by C-G formula based on SCr of 0.67 mg/dL). Liver Function Tests: Recent Labs  Lab 06/02/18 1615 06/02/18 2223  06/03/18 0341  AST 1,049*  --  932*  ALT 737*  --  646*  ALKPHOS 217*  --  169*  BILITOT 8.8* 8.9* 8.8*  PROT 7.3  --  6.1*  ALBUMIN 2.9*  --  2.4*   Recent Labs  Lab 06/02/18 1615  LIPASE 37   Recent Labs  Lab 06/02/18 2223  AMMONIA 33   Coagulation Profile: Recent Labs  Lab 06/02/18 1615 06/03/18 0341  INR 1.2 1.3*   Cardiac Enzymes: No results for input(s): CKTOTAL, CKMB, CKMBINDEX, TROPONINI in the last 168 hours. BNP (last 3 results) No results for input(s): PROBNP in the last 8760 hours. HbA1C: No results for input(s): HGBA1C in the last 72 hours.  CBG: No results for input(s): GLUCAP in the last 168 hours. Lipid Profile: No results for input(s): CHOL, HDL, LDLCALC, TRIG, CHOLHDL, LDLDIRECT in the last 72 hours. Thyroid Function Tests: No results for input(s): TSH, T4TOTAL, FREET4, T3FREE, THYROIDAB in the last 72 hours. Anemia Panel: No results for input(s): VITAMINB12, FOLATE, FERRITIN, TIBC, IRON, RETICCTPCT in the last 72 hours. Sepsis Labs: No results for input(s): PROCALCITON, LATICACIDVEN in the last 168 hours.  No results found for this or any previous visit (from the past 240 hour(s)).       Radiology Studies: US Abdomen Complete  Result Date: 06/02/2018 CLINICAL DATA:  Transaminitis.  Biliary obstruction. EXAM: ABDOMEN ULTRASOUND COMPLETE COMPARISON:  None. FINDINGS: Gallbladder: Gallbladder not visualized secondary to overlying bowel gas versus decompressed gallbladder. Common bile duct: Diameter: 3.4 mm Liver: 2.6 x 3.4 x 2.7 cm anechoic right hepatic mass consistent with a cyst. Smaller 10 mm left hepatic cyst. Increased hepatic parenchymal echogenicity. Portal vein is patent on color Doppler imaging with normal direction of blood flow towards the liver. IVC: No abnormality visualized. Pancreas: Visualized portion unremarkable. Spleen: Spleen is enlarged measuring 13.6 cm in length with a volume of 469 mL. No focal splenic lesion. Right Kidney: Length: 9.4 cm. Echogenicity within normal limits. No mass or hydronephrosis visualized. Left Kidney: Length: 9.6 cm. Echogenicity within normal limits. No mass or hydronephrosis visualized. Abdominal aorta: Abdominal aorta measures 2.8 cm. Other findings: None IMPRESSION: 1. Gallbladder not visualized likely secondary to a contracted state. 2. Splenomegaly. 3. Increased hepatic echogenicity as can be seen with hepatic steatosis. 4. Ectatic abdominal aorta, at risk for aneurysm development. Recommend follow-up aortic ultrasound in 5 years. This recommendation follows ACR consensus  guidelines: White Paper of the ACR Incidental Findings Committee II on Vascular Findings. J Am Coll Radiol 2013; 10:789-794. Electronically Signed   By: Kathreen Devoid   On: 06/02/2018 18:30        Scheduled Meds: . risperiDONE  0.5 mg Oral QHS   Continuous Infusions:   LOS: 1 day     Cordelia Poche, MD [facility] 06/03/2018, 10:40 AM  If 7PM-7AM, please contact night-coverage [email or web address]

## 2018-06-03 NOTE — Progress Notes (Signed)
Call received from pt's son Will asking requesting updates on pt. Son updated on plan of care including latest lab results.  VWilliams,RN.

## 2018-06-03 NOTE — Consult Note (Addendum)
Referring Provider:  [facility] Primary Care Physician:  Alroy Dust, L.Marlou Sa, MD Primary Gastroenterologist: Unassigned/[facility]/followed by [facility]  Reason for Consultation: Abnormal LFTs, history of chronic Hepatitis B   HPI: Brooke Huber is a 73 y.o. female with past medical history of stage IV follicular lymphoma with last chemotherapy sometime in September 2019, history of chronic hepatitis B and was started on tenofovir prior to chemotherapy and was subsequently discontinued in December 2019 was seen by primary care physician for nausea vomiting and diarrhea.  Outpatient blood work revealed abnormal liver tests and she was advised to come to the emergency room for further evaluation and management.    Patient seen and examined at bedside.  She started noticing nausea, vomiting and diarrhea around 2 weeks ago.  She denies any blood in the stool.  Denies any blood in the vomiting.  Mild abdominal discomfort has improved.  Diarrhea is resolved now.  Currently having regular bowel movements.  Her hepatitis B surface antigen was positive in December 2019 with negative HBV DNA.  CT abdomen pelvis with contrast in April 2020 as well as recent ultrasound yesterday showed benign  liver cyst otherwise no acute changes.  Her INR is 1.3.    Past Medical History:  Diagnosis Date  . 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 Cancer [facility])    breast lymphoma with mets  . Depression   . GERD (gastroesophageal reflux disease)   . Hepatitis   . Hepatitis B 02/1995  . Seizures ([place])    hx of seizure in 1993 and 1994 - caused by stress per patient     Past Surgical History:  Procedure Laterality Date  . APPENDECTOMY    . COLONOSCOPY WITH PROPOFOL N/A 12/29/2015   Procedure: COLONOSCOPY WITH PROPOFOL;  Surgeon: Garlan Fair, MD;  Location: [facility] ENDOSCOPY;  Service: Endoscopy;  Laterality: N/A;  . TONSILLECTOMY AND ADENOIDECTOMY Bilateral 1954  . TUBAL LIGATION      Prior to Admission  medications   Medication Sig Start Date End Date Taking? Authorizing Provider  Calcium Carb-Cholecalciferol 600-100 MG-UNIT CAPS Take 1 tablet by mouth daily.   Yes [provider]  LORazepam (ATIVAN) 0.5 MG tablet Take 1 tablet (0.5 mg total) by mouth every 6 (six) hours as needed (Nausea or vomiting). 06/30/17  Yes Brunetta Genera, MD  ondansetron (ZOFRAN) 8 MG tablet Take 1 tablet (8 mg total) by mouth 2 (two) times daily as needed for refractory nausea / vomiting. Start on day 2 after bendamustine chemo. 06/30/17  Yes Brunetta Genera, MD  prochlorperazine (COMPAZINE) 10 MG tablet Take 1 tablet (10 mg total) by mouth every 6 (six) hours as needed (Nausea or vomiting). 06/30/17  Yes Brunetta Genera, MD  risperiDONE (RISPERDAL) 0.5 MG tablet Take 1 tablet (0.5 mg total) by mouth at bedtime. 12/21/17 12/21/18 Yes Charlcie Cradle, MD  sertraline (ZOLOFT) 50 MG tablet Take 1 tablet (50 mg total) by mouth daily. 12/21/17 12/21/18 Yes Charlcie Cradle, MD  acyclovir (ZOVIRAX) 400 MG tablet Take 1 tablet (400 mg total) by mouth daily. Patient not taking: Reported on 06/02/2018 06/30/17   Brunetta Genera, MD  calcium-vitamin D (OSCAL WITH D) 500-200 MG-UNIT tablet Take 1 tablet by mouth 3 (three) times daily. Patient not taking: Reported on 06/02/2018 07/04/17   Leandrew Koyanagi, MD  clotrimazole (LOTRIMIN) 1 % cream Apply 1 application topically 2 (two) times daily. Patient not taking: Reported on 06/02/2018 09/28/17   Brunetta Genera, MD  dexamethasone (DECADRON) 4 MG tablet Take 2 tablets (8 mg total) by mouth daily. Start the day after bendamustine chemotherapy for 2 days. Take with food. Patient not taking: Reported on 06/02/2018 06/30/17   Brunetta Genera, MD  Tenofovir Alafenamide Fumarate 25 MG TABS Take 1 tablet (25 mg total) by mouth daily. Patient not taking: Reported on 06/02/2018 10/17/17   Thayer Headings, MD    Scheduled Meds: . risperiDONE  0.5 mg Oral QHS    Continuous Infusions: PRN Meds:.ondansetron **OR** ondansetron (ZOFRAN) IV  Allergies as of 06/02/2018 - Review Complete 06/02/2018  Allergen Reaction Noted  . Penicillins Hives and Rash 06/08/2014    Family History  Problem Relation Age of Onset  . Alzheimer's disease Sister   . Dementia Sister   . Sexual abuse Sister   . Seizures Sister   . Alcohol abuse Brother   . Alcohol abuse Maternal Uncle   . Drug abuse Cousin   . Drug abuse Son   . Depression Neg Hx     Social History   Socioeconomic History  . Marital status: Single    Spouse name: Not on file  . Number of children: Not on file  . Years of education: Not on file  . Highest education level: Not on file  Occupational History  . Not on file  Social Needs  . Financial resource strain: Not on file  . Food insecurity:    Worry: Not on file    Inability: Not on file  . Transportation needs:    Medical: Not on file    Non-medical: Not on file  Tobacco Use  . Smoking status: Never Smoker  . Smokeless tobacco: Never Used  Substance and Sexual Activity  . Alcohol use: No  . Drug use: No  . Sexual activity: Never    Birth control/protection: None  Lifestyle  . Physical activity:    Days per week: Not on file    Minutes per session: Not on file  . Stress: Not on file  Relationships  . Social connections:    Talks on phone: Not on file    Gets together: Not on file    Attends religious service: Not on file    Active member of club or organization: Not on file    Attends meetings of clubs or organizations: Not on file    Relationship status: Not on file  . Intimate partner violence:    Fear of current or ex partner: Not on file    Emotionally abused: Not on file    Physically abused: Not on file    Forced sexual activity: Not on file  Other Topics Concern  . Not on file  Social History Narrative  . Not on file    Review of Systems: Review of Systems  Constitutional: Positive for malaise/fatigue.  Negative for chills and fever.  HENT: Negative for hearing loss and tinnitus.   Eyes: Negative for blurred vision and double vision.  Respiratory: Negative for cough and hemoptysis.   Cardiovascular: Negative for chest pain and palpitations.  Gastrointestinal: Positive for diarrhea, heartburn, nausea and vomiting. Negative for blood in stool and melena.  Genitourinary: Negative for dysuria and urgency.  Musculoskeletal: Positive for back pain and myalgias.  Skin: Negative for itching and rash.  Neurological: Negative for seizures and loss of consciousness.  Endo/Heme/Allergies: Does not bruise/bleed easily.  Psychiatric/Behavioral: Negative for substance abuse and suicidal ideas.    Physical Exam: Vital signs: Vitals:   06/02/18 2050 06/03/18  0609  BP: 121/70 135/79  Pulse: (!) 57 (!) 55  Resp: 20 18  Temp: 98.4 F (36.9 C) 98.2 F (36.8 C)  SpO2: 97% 96%   Last BM Date: 06/01/18 Physical Exam  Constitutional: She is oriented to person, place, and time. She appears well-developed and well-nourished.  HENT:  Head: Atraumatic.  Mouth/Throat: Oropharynx is clear and moist. No oropharyngeal exudate.  Eyes: EOM are normal. Scleral icterus is present.  Neck: Normal range of motion. Neck supple.  Cardiovascular: Normal rate, regular rhythm and normal heart sounds.  Pulmonary/Chest: Effort normal and breath sounds normal. No respiratory distress.  Abdominal: Soft. Bowel sounds are normal. She exhibits no distension. There is no abdominal tenderness. There is no rebound and no guarding.  Musculoskeletal: Normal range of motion.        General: No edema.  Neurological: She is alert and oriented to person, place, and time.  Skin: Skin is warm. No erythema.  Psychiatric: She has a normal mood and affect. Judgment normal.  Vitals reviewed.  GI:  Lab Results: Recent Labs    06/02/18 1615 06/03/18 0341  WBC 3.2* 2.9*  HGB 12.1 10.0*  HCT 36.4 31.7*  PLT 150 112*   BMET Recent  Labs    06/02/18 1615 06/03/18 0341  NA 138 140  K 3.3* 4.1  CL 106 113*  CO2 24 25  GLUCOSE 110* 85  BUN 14 12  CREATININE 0.79 0.67  CALCIUM 7.9* 7.6*   LFT Recent Labs    06/03/18 0341  PROT 6.1*  ALBUMIN 2.4*  AST 932*  ALT 646*  ALKPHOS 169*  BILITOT 8.8*  BILIDIR 5.2*  IBILI 3.6*   PT/INR Recent Labs    06/02/18 1615 06/03/18 0341  LABPROT 14.8 15.9*  INR 1.2 1.3*     Studies/Results: US Abdomen Complete  Result Date: 06/02/2018 CLINICAL DATA:  Transaminitis.  Biliary obstruction. EXAM: ABDOMEN ULTRASOUND COMPLETE COMPARISON:  None. FINDINGS: Gallbladder: Gallbladder not visualized secondary to overlying bowel gas versus decompressed gallbladder. Common bile duct: Diameter: 3.4 mm Liver: 2.6 x 3.4 x 2.7 cm anechoic right hepatic mass consistent with a cyst. Smaller 10 mm left hepatic cyst. Increased hepatic parenchymal echogenicity. Portal vein is patent on color Doppler imaging with normal direction of blood flow towards the liver. IVC: No abnormality visualized. Pancreas: Visualized portion unremarkable. Spleen: Spleen is enlarged measuring 13.6 cm in length with a volume of 469 mL. No focal splenic lesion. Right Kidney: Length: 9.4 cm. Echogenicity within normal limits. No mass or hydronephrosis visualized. Left Kidney: Length: 9.6 cm. Echogenicity within normal limits. No mass or hydronephrosis visualized. Abdominal aorta: Abdominal aorta measures 2.8 cm. Other findings: None IMPRESSION: 1. Gallbladder not visualized likely secondary to a contracted state. 2. Splenomegaly. 3. Increased hepatic echogenicity as can be seen with hepatic steatosis. 4. Ectatic abdominal aorta, at risk for aneurysm development. Recommend follow-up aortic ultrasound in 5 years. This recommendation follows ACR consensus guidelines: White Paper of the ACR Incidental Findings Committee II on Vascular Findings. J Am Coll Radiol 2013; 10:789-794. Electronically Signed   By: Kathreen Devoid   On:  06/02/2018 18:30    Impression/Plan: -Abnormal LFTs ??reactivation of chronic hepatitis B vs infection with other hepatitis. -Nausea and vomiting.  Improving.  Could be  from acute hepatitis. -Diarrhea.  Resolved.  -H/O Stage IV follicular lymphoma.  Discussion -------------- -She was started on tenofovir for chronic hepatitis B prior to her chemotherapy for lymphoma.  Looks like her tenofovir was stopped in the  later part of 2019 probably between October and December 2019.   Her hepatitis B surface antigen was positive in December 2019 with negative HBV DNA.  CT abdomen pelvis with contrast in April 2020 as well as recent ultrasound yesterday showed benign  liver cyst otherwise no acute changes.  Her INR is 1.3.  Recommendations ------------------------ -Follow hepatitis panel. -If hepatitis B surface antigen positive, consider restarting tenofovir. -Avoid hepatotoxic medications.  Avoid alcohol. -Possibility of restarting tenofovir was discussed with the patient.  She verbalized understanding. -I will follow   LOS: 1 day   Otis Brace  MD, FACP 06/03/2018, 9:02 AM  Contact #  [phone]

## 2018-06-04 LAB — COMPREHENSIVE METABOLIC PANEL WITH GFR
ALT: 669 U/L — ABNORMAL HIGH (ref 0–44)
AST: 1073 U/L — ABNORMAL HIGH (ref 15–41)
Albumin: 2.3 g/dL — ABNORMAL LOW (ref 3.5–5.0)
Alkaline Phosphatase: 167 U/L — ABNORMAL HIGH (ref 38–126)
Anion gap: 2 — ABNORMAL LOW (ref 5–15)
BUN: 13 mg/dL (ref 8–23)
CO2: 26 mmol/L (ref 22–32)
Calcium: 7.6 mg/dL — ABNORMAL LOW (ref 8.9–10.3)
Chloride: 111 mmol/L (ref 98–111)
Creatinine, Ser: 0.89 mg/dL (ref 0.44–1.00)
GFR calc Af Amer: >60 mL/min
GFR calc non Af Amer: >60 mL/min
Glucose, Bld: 92 mg/dL (ref 70–99)
Potassium: 4 mmol/L (ref 3.5–5.1)
Sodium: 139 mmol/L (ref 135–145)
Total Bilirubin: 10.2 mg/dL — ABNORMAL HIGH (ref 0.3–1.2)
Total Protein: 6.2 g/dL — ABNORMAL LOW (ref 6.5–8.1)

## 2018-06-04 LAB — PROTIME-INR
INR: 1.4 — ABNORMAL HIGH (ref 0.8–1.2)
Prothrombin Time: 16.6 seconds — ABNORMAL HIGH (ref 11.4–15.2)

## 2018-06-04 LAB — CBC
HCT: 31.7 % — ABNORMAL LOW (ref 36.0–46.0)
Hemoglobin: 10.2 g/dL — ABNORMAL LOW (ref 12.0–15.0)
MCH: 30.4 pg (ref 26.0–34.0)
MCHC: 32.2 g/dL (ref 30.0–36.0)
MCV: 94.3 fL (ref 80.0–100.0)
Platelets: 102 K/uL — ABNORMAL LOW (ref 150–400)
RBC: 3.36 MIL/uL — ABNORMAL LOW (ref 3.87–5.11)
RDW: 16.6 % — ABNORMAL HIGH (ref 11.5–15.5)
WBC: 3 K/uL — ABNORMAL LOW (ref 4.0–10.5)
nRBC: 0 % (ref 0.0–0.2)

## 2018-06-04 LAB — HEPATITIS B CORE ANTIBODY, TOTAL: Hep B Core Total Ab: POSITIVE — AB

## 2018-06-04 LAB — HEPATITIS A ANTIBODY, IGM: Hep A IgM: NEGATIVE

## 2018-06-04 LAB — HEPATITIS B SURFACE ANTIBODY, QUANTITATIVE: Hep B S AB Quant (Post): <3.1 m[IU]/mL — ABNORMAL LOW (ref >9.9)

## 2018-06-04 LAB — HCV AB W REFLEX TO QUANT PCR: HCV Ab: 0.4 s/co ratio (ref 0.0–0.9)

## 2018-06-04 LAB — HCV INTERPRETATION

## 2018-06-04 NOTE — Plan of Care (Signed)
Patient is very concerned about cost of care, inpatient and outpatient.   Worries that she can't afford the medications needed after hospitalization.

## 2018-06-04 NOTE — Progress Notes (Signed)
PROGRESS NOTE    Brooke Huber  ERX:[id number] DOB: 08-31-1945 DOA: 06/02/2018 PCP: Alroy Dust, L.Marlou Sa, MD   Brief Narrative: Brooke Huber is a 73 y.o. female with medical history significant for Stage IV follicular lymphoma s/p chemotherapy, chronic hepatitis B, and MDD with psychosis, PTSD, and panic disorder. Here with acute hepatitis.   Assessment & Plan:   Principal Problem:   Acute hepatitis Active Problems:   Hypokalemia   Chronic viral hepatitis B without delta-agent (HCC)   Follicular lymphoma grade II of lymph nodes of multiple sites [facility])   Depression   Acute hepatitis Presumed viral. AST/ALT/Bilirubin stable. Normal ammonia. No confusion. INR elevated. APAP level undetectable. HIV negative -GI recommendations pending -Hepatitis serologies pending -CM for medication costs/etc  Hypokalemia Resolved with repletion  Stage IV follicular lymphoma Follows with Dr. Irene Limbo. S/p chemotherapy.  Major depression w/ psychosis PTSD/panic disorder Stable. On zoloft and Risperdal -Continue Risperdal -Holding Zoloft secondary to liver dysfunction  Ectatic abdominal aorta 2.8 cm. Follow-up ultrasound in 5 years   DVT prophylaxis: SCDs Code Status:   Code Status: Full Code Family Communication: None Disposition Plan: Discharge pending GI recommendations   Consultants:   [facility]  Procedures:   None  Antimicrobials:  None    Subjective: Concerned about affording her medications. No other concerns today.  Objective: Vitals:   06/03/18 0609 06/03/18 1357 06/03/18 2058 06/04/18 0640  BP: 135/79 116/66 120/69 138/62  Pulse: (!) 55 (!) 53 64 60  Resp: 18 16 18 18   Temp: 98.2 F (36.8 C) 98.7 F (37.1 C) 98.8 F (37.1 C) 98.2 F (36.8 C)  TempSrc: Oral Oral Oral Oral  SpO2: 96% 96% 95% 95%  Weight:      Height:        Intake/Output Summary (Last 24 hours) at 06/04/2018 1128 Last data filed at 06/04/2018 1000 Gross per 24 hour  Intake 240 ml   Output 1300 ml  Net -1060 ml   Filed Weights   06/02/18 1533  Weight: 78 kg    Examination:  General exam: Appears calm and comfortable Respiratory system: Clear to auscultation. Respiratory effort normal. Cardiovascular system: S1 & S2 heard, RRR. No murmurs, rubs, gallops or clicks. Gastrointestinal system: Abdomen is nondistended, soft and nontender. No organomegaly or masses felt. Normal bowel sounds heard. Central nervous system: Alert and oriented. No focal neurological deficits. Extremities: No edema. No calf tenderness Skin: No cyanosis. No rashes Psychiatry: Judgement and insight appear normal. Mood & affect appropriate.     Data Reviewed: I have personally reviewed following labs and imaging studies  CBC: Recent Labs  Lab 06/02/18 1615 06/03/18 0341 06/04/18 0318  WBC 3.2* 2.9* 3.0*  NEUTROABS 1.9  --   --   HGB 12.1 10.0* 10.2*  HCT 36.4 31.7* 31.7*  MCV 92.9 96.1 94.3  PLT 150 112* 950*   Basic Metabolic Panel: Recent Labs  Lab 06/02/18 1615 06/02/18 2223 06/03/18 0341 06/04/18 0318  NA 138  --  140 139  K 3.3*  --  4.1 4.0  CL 106  --  113* 111  CO2 24  --  25 26  GLUCOSE 110*  --  85 92  BUN 14  --  12 13  CREATININE 0.79  --  0.67 0.89  CALCIUM 7.9*  --  7.6* 7.6*  MG  --  1.9  --   --   PHOS  --  2.5  --   --    GFR: Estimated Creatinine Clearance: 54.5  mL/min (by C-G formula based on SCr of 0.89 mg/dL). Liver Function Tests: Recent Labs  Lab 06/02/18 1615 06/02/18 2223 06/03/18 0341 06/04/18 0318  AST 1,049*  --  932* 1,073*  ALT 737*  --  646* 669*  ALKPHOS 217*  --  169* 167*  BILITOT 8.8* 8.9* 8.8* 10.2*  PROT 7.3  --  6.1* 6.2*  ALBUMIN 2.9*  --  2.4* 2.3*   Recent Labs  Lab 06/02/18 1615  LIPASE 37   Recent Labs  Lab 06/02/18 2223  AMMONIA 33   Coagulation Profile: Recent Labs  Lab 06/02/18 1615 06/03/18 0341 06/04/18 0318  INR 1.2 1.3* 1.4*   Cardiac Enzymes: No results for input(s): CKTOTAL, CKMB,  CKMBINDEX, TROPONINI in the last 168 hours. BNP (last 3 results) No results for input(s): PROBNP in the last 8760 hours. HbA1C: No results for input(s): HGBA1C in the last 72 hours. CBG: No results for input(s): GLUCAP in the last 168 hours. Lipid Profile: No results for input(s): CHOL, HDL, LDLCALC, TRIG, CHOLHDL, LDLDIRECT in the last 72 hours. Thyroid Function Tests: No results for input(s): TSH, T4TOTAL, FREET4, T3FREE, THYROIDAB in the last 72 hours. Anemia Panel: No results for input(s): VITAMINB12, FOLATE, FERRITIN, TIBC, IRON, RETICCTPCT in the last 72 hours. Sepsis Labs: No results for input(s): PROCALCITON, LATICACIDVEN in the last 168 hours.  No results found for this or any previous visit (from the past 240 hour(s)).       Radiology Studies: US Abdomen Complete  Result Date: 06/02/2018 CLINICAL DATA:  Transaminitis.  Biliary obstruction. EXAM: ABDOMEN ULTRASOUND COMPLETE COMPARISON:  None. FINDINGS: Gallbladder: Gallbladder not visualized secondary to overlying bowel gas versus decompressed gallbladder. Common bile duct: Diameter: 3.4 mm Liver: 2.6 x 3.4 x 2.7 cm anechoic right hepatic mass consistent with a cyst. Smaller 10 mm left hepatic cyst. Increased hepatic parenchymal echogenicity. Portal vein is patent on color Doppler imaging with normal direction of blood flow towards the liver. IVC: No abnormality visualized. Pancreas: Visualized portion unremarkable. Spleen: Spleen is enlarged measuring 13.6 cm in length with a volume of 469 mL. No focal splenic lesion. Right Kidney: Length: 9.4 cm. Echogenicity within normal limits. No mass or hydronephrosis visualized. Left Kidney: Length: 9.6 cm. Echogenicity within normal limits. No mass or hydronephrosis visualized. Abdominal aorta: Abdominal aorta measures 2.8 cm. Other findings: None IMPRESSION: 1. Gallbladder not visualized likely secondary to a contracted state. 2. Splenomegaly. 3. Increased hepatic echogenicity as can be  seen with hepatic steatosis. 4. Ectatic abdominal aorta, at risk for aneurysm development. Recommend follow-up aortic ultrasound in 5 years. This recommendation follows ACR consensus guidelines: White Paper of the ACR Incidental Findings Committee II on Vascular Findings. J Am Coll Radiol 2013; 10:789-794. Electronically Signed   By: Kathreen Devoid   On: 06/02/2018 18:30        Scheduled Meds: . risperiDONE  0.5 mg Oral QHS   Continuous Infusions:   LOS: 2 days     Cordelia Poche, MD [facility] 06/04/2018, 11:28 AM  If 7PM-7AM, please contact night-coverage [email or web address]

## 2018-06-04 NOTE — TOC Progression Note (Signed)
Transition of Care [facility]) - Progression Note    Patient Details  Name: Brooke Huber MRN: [id number] Date of Birth: 1945/05/08  Transition of Care [facility]) CM/SW Contact  Leeroy Cha, RN Phone Number: 06/04/2018, 11:38 AM  Clinical Narrative:    Patient worried about medications for home, has insurance and is not eligible for Match program.  Given given Good RX card to use at the pharmacy and with instruction for use.        Expected Discharge Plan and Services     Discharge Planning Services: Medication Assistance(good rx card with explanation for use given.)                                           Social Determinants of Health (SDOH) Interventions    Readmission Risk Interventions No flowsheet data found.

## 2018-06-04 NOTE — Progress Notes (Signed)
[facility] PROGRESS NOTE Subjective The patient's hepatitis labs are still pending.  She was on the tenofovir for chronic hepatitis B and was doing well with that.  She is not really sure why that was stopped or if it was stopped by Dr.  Her impression was that she could not take that while she was taking her chemotherapy but I am not sure if that is the case or not.  She notes that her urine is becoming yellow.  Objective: Vital signs in last 24 hours: Temp:  [98.2 F (36.8 C)-98.8 F (37.1 C)] 98.2 F (36.8 C) (05/04 0640) Pulse Rate:  [53-64] 60 (05/04 0640) Resp:  [16-18] 18 (05/04 0640) BP: (116-138)/(62-69) 138/62 (05/04 0640) SpO2:  [95 %-96 %] 95 % (05/04 0640) Last BM Date: 06/03/18  Intake/Output from previous day: 05/03 0701 - 05/04 0700 In: 240 [P.O.:240] Out: 900 [Urine:900] Intake/Output this shift: Total I/O In: -  Out: 400 [Urine:400]  PE: General--jaundice   Lab Results: Recent Labs    06/02/18 1615 06/03/18 0341 06/04/18 0318  WBC 3.2* 2.9* 3.0*  HGB 12.1 10.0* 10.2*  HCT 36.4 31.7* 31.7*  PLT 150 112* 102*   BMET Recent Labs    06/02/18 1615 06/03/18 0341 06/04/18 0318  NA 138 140 139  K 3.3* 4.1 4.0  CL 106 113* 111  CO2 24 25 26   CREATININE 0.79 0.67 0.89   LFT Recent Labs    06/02/18 1615 06/02/18 2223 06/03/18 0341 06/04/18 0318  PROT 7.3  --  6.1* 6.2*  AST 1,049*  --  932* 1,073*  ALT 737*  --  646* 669*  ALKPHOS 217*  --  169* 167*  BILITOT 8.8* 8.9* 8.8* 10.2*  BILIDIR  --  5.7* 5.2*  --   IBILI  --  3.2* 3.6*  --    PT/INR Recent Labs    06/02/18 1615 06/03/18 0341 06/04/18 0318  LABPROT 14.8 15.9* 16.6*  INR 1.2 1.3* 1.4*   PANCREAS Recent Labs    06/02/18 1615  LIPASE 37         Studies/Results: US Abdomen Complete  Result Date: 06/02/2018 CLINICAL DATA:  Transaminitis.  Biliary obstruction. EXAM: ABDOMEN ULTRASOUND COMPLETE COMPARISON:  None. FINDINGS: Gallbladder: Gallbladder not  visualized secondary to overlying bowel gas versus decompressed gallbladder. Common bile duct: Diameter: 3.4 mm Liver: 2.6 x 3.4 x 2.7 cm anechoic right hepatic mass consistent with a cyst. Smaller 10 mm left hepatic cyst. Increased hepatic parenchymal echogenicity. Portal vein is patent on color Doppler imaging with normal direction of blood flow towards the liver. IVC: No abnormality visualized. Pancreas: Visualized portion unremarkable. Spleen: Spleen is enlarged measuring 13.6 cm in length with a volume of 469 mL. No focal splenic lesion. Right Kidney: Length: 9.4 cm. Echogenicity within normal limits. No mass or hydronephrosis visualized. Left Kidney: Length: 9.6 cm. Echogenicity within normal limits. No mass or hydronephrosis visualized. Abdominal aorta: Abdominal aorta measures 2.8 cm. Other findings: None IMPRESSION: 1. Gallbladder not visualized likely secondary to a contracted state. 2. Splenomegaly. 3. Increased hepatic echogenicity as can be seen with hepatic steatosis. 4. Ectatic abdominal aorta, at risk for aneurysm development. Recommend follow-up aortic ultrasound in 5 years. This recommendation follows ACR consensus guidelines: White Paper of the ACR Incidental Findings Committee II on Vascular Findings. J Am Coll Radiol 2013; 10:789-794. Electronically Signed   By: Kathreen Devoid   On: 06/02/2018 18:30    Medications: I have reviewed the patient's current medications.  Assessment:  1.  Abnormal LFTs.  Probably reactivation of hepatitis B.  Patient believes that she had to stop her tenofovir due to her cancer treatment but not sure if that is really the case.  Hepatitis B labs pendin 2.  Stage IV follicular lymphoma.  She reports that she has had 3 of 6 planned chemotherapy treatments.   Plan: We will wait for the results of the hepatitis B surface antigen etc. and discussed with Dr. Irene Limbo whether or not to start her back on tenofovir.  The patient's belief is that she cannot take tenofovir  while she was getting chemotherapy and we need to determine this for sure.   Nancy Fetter 06/04/2018, 12:34 PM  This note was created using voice recognition software. Minor errors may Have occurred unintentionally.  Pager: [phone] If no answer or after hours call [phone]

## 2018-06-05 DIAGNOSIS — Z880 Allergy status to penicillin: Secondary | ICD-10-CM

## 2018-06-05 DIAGNOSIS — Z9221 Personal history of antineoplastic chemotherapy: Secondary | ICD-10-CM

## 2018-06-05 DIAGNOSIS — C8218 Follicular lymphoma grade II, lymph nodes of multiple sites: Secondary | ICD-10-CM

## 2018-06-05 DIAGNOSIS — Z598 Other problems related to housing and economic circumstances: Secondary | ICD-10-CM

## 2018-06-05 DIAGNOSIS — B181 Chronic viral hepatitis B without delta-agent: Secondary | ICD-10-CM

## 2018-06-05 LAB — COMPREHENSIVE METABOLIC PANEL
ALT: 763 U/L — ABNORMAL HIGH (ref 0–44)
AST: 1203 U/L — ABNORMAL HIGH (ref 15–41)
Albumin: 2.2 g/dL — ABNORMAL LOW (ref 3.5–5.0)
Alkaline Phosphatase: 158 U/L — ABNORMAL HIGH (ref 38–126)
Anion gap: 4 — ABNORMAL LOW (ref 5–15)
BUN: 11 mg/dL (ref 8–23)
CO2: 26 mmol/L (ref 22–32)
Calcium: 7.9 mg/dL — ABNORMAL LOW (ref 8.9–10.3)
Chloride: 108 mmol/L (ref 98–111)
Creatinine, Ser: 0.84 mg/dL (ref 0.44–1.00)
GFR calc Af Amer: >60 mL/min (ref >60)
GFR calc non Af Amer: >60 mL/min (ref >60)
Glucose, Bld: 94 mg/dL (ref 70–99)
Potassium: 4.2 mmol/L (ref 3.5–5.1)
Sodium: 138 mmol/L (ref 135–145)
Total Bilirubin: 12 mg/dL — ABNORMAL HIGH (ref 0.3–1.2)
Total Protein: 6.5 g/dL (ref 6.5–8.1)

## 2018-06-05 LAB — CBC
HCT: 31.4 % — ABNORMAL LOW (ref 36.0–46.0)
Hemoglobin: 10.5 g/dL — ABNORMAL LOW (ref 12.0–15.0)
MCH: 31 pg (ref 26.0–34.0)
MCHC: 33.4 g/dL (ref 30.0–36.0)
MCV: 92.6 fL (ref 80.0–100.0)
Platelets: 113 10*3/uL — ABNORMAL LOW (ref 150–400)
RBC: 3.39 MIL/uL — ABNORMAL LOW (ref 3.87–5.11)
RDW: 17 % — ABNORMAL HIGH (ref 11.5–15.5)
WBC: 3.6 10*3/uL — ABNORMAL LOW (ref 4.0–10.5)
nRBC: 0 % (ref 0.0–0.2)

## 2018-06-05 LAB — PROTIME-INR
INR: 1.3 — ABNORMAL HIGH (ref 0.8–1.2)
Prothrombin Time: 16.3 seconds — ABNORMAL HIGH (ref 11.4–15.2)

## 2018-06-05 LAB — HEPATITIS B SURFACE ANTIGEN

## 2018-06-05 LAB — HEPATITIS B SURFACE AG, CONFIRM: HBsAG Confirmation: POSITIVE — AB

## 2018-06-05 MED ORDER — TENOFOVIR ALAFENAMIDE FUMARATE 25 MG PO TABS: 25.0000 mg | ORAL_TABLET | Freq: Every day | ORAL | 3 refills | Status: DC

## 2018-06-05 MED ORDER — TENOFOVIR ALAFENAMIDE FUMARATE 25 MG PO TABS: 25.0000 mg | ORAL_TABLET | Freq: Every day | ORAL | Status: DC

## 2018-06-05 MED ORDER — TENOFOVIR ALAFENAMIDE FUMARATE 25 MG PO TABS
25.0000 mg | ORAL_TABLET | Freq: Every day | ORAL | Status: DC
  Administered 2018-06-05 – 2018-06-12 (×8): 25 mg via ORAL
  Filled 2018-06-05 (×8): qty 1

## 2018-06-05 NOTE — Progress Notes (Signed)
PROGRESS NOTE    Brooke Huber  JGG:[id number] DOB: 08-01-1945 DOA: 06/02/2018 PCP: Alroy Dust, L.Marlou Sa, MD   Brief Narrative: Brooke Huber is a 73 y.o. female with medical history significant for Stage IV follicular lymphoma s/p chemotherapy, chronic hepatitis B, and MDD with psychosis, PTSD, and panic disorder. Here with acute hepatitis.   Assessment & Plan:   Principal Problem:   Acute hepatitis Active Problems:   Hypokalemia   Chronic viral hepatitis B without delta-agent (HCC)   Follicular lymphoma grade II of lymph nodes of multiple sites [facility])   Depression   Acute hepatitis Presumed viral from reactivation of previous hepatitis B. AST/ALT/Bilirubin trending up. Normal ammonia. No confusion. INR elevated. APAP level undetectable. HIV negative. INR stable. -GI recommendations: discussed with ID and oncology -Hepatitis B antigen and DNA pending  Hypokalemia Resolved with repletion  Stage IV follicular lymphoma Follows with Dr. Irene Limbo. S/p chemotherapy.  Major depression w/ psychosis PTSD/panic disorder Stable. On zoloft and Risperdal -Continue Risperdal -Holding Zoloft secondary to liver dysfunction  Ectatic abdominal aorta 2.8 cm. Follow-up ultrasound in 5 years   DVT prophylaxis: SCDs Code Status:   Code Status: Full Code Family Communication: None Disposition Plan: Discharge pending GI recommendations   Consultants:   [facility]  Infectious disease  Procedures:   None  Antimicrobials:  None    Subjective: No concerns today  Objective: Vitals:   06/04/18 1350 06/04/18 2222 06/05/18 0437 06/05/18 1338  BP: (!) 98/57 113/62 117/60 (!) 107/55  Pulse: (!) 57 (!) 59 (!) 56 (!) 58  Resp: 18 16 16 18   Temp: 98.3 F (36.8 C) 98.9 F (37.2 C) 98.1 F (36.7 C) 99.2 F (37.3 C)  TempSrc: Oral Oral Oral Oral  SpO2: 99% 96% 97% 100%  Weight:      Height:        Intake/Output Summary (Last 24 hours) at 06/05/2018 1514 Last data filed at  06/05/2018 1226 Gross per 24 hour  Intake 1210 ml  Output 510 ml  Net 700 ml   Filed Weights   06/02/18 1533  Weight: 78 kg    Examination:  General exam: Appears calm and comfortable Respiratory system: Clear to auscultation. Respiratory effort normal. Cardiovascular system: S1 & S2 heard, RRR. No murmurs, rubs, gallops or clicks. Gastrointestinal system: Abdomen is nondistended, soft and nontender. No organomegaly or masses felt. Normal bowel sounds heard. Central nervous system: Alert and oriented. No focal neurological deficits. Extremities: No edema. No calf tenderness Skin: No cyanosis. Jaundice Psychiatry: Judgement and insight appear normal. Mood & affect appropriate.     Data Reviewed: I have personally reviewed following labs and imaging studies  CBC: Recent Labs  Lab 06/02/18 1615 06/03/18 0341 06/04/18 0318 06/05/18 0359  WBC 3.2* 2.9* 3.0* 3.6*  NEUTROABS 1.9  --   --   --   HGB 12.1 10.0* 10.2* 10.5*  HCT 36.4 31.7* 31.7* 31.4*  MCV 92.9 96.1 94.3 92.6  PLT 150 112* 102* 546*   Basic Metabolic Panel: Recent Labs  Lab 06/02/18 1615 06/02/18 2223 06/03/18 0341 06/04/18 0318 06/05/18 0359  NA 138  --  140 139 138  K 3.3*  --  4.1 4.0 4.2  CL 106  --  113* 111 108  CO2 24  --  25 26 26   GLUCOSE 110*  --  85 92 94  BUN 14  --  12 13 11   CREATININE 0.79  --  0.67 0.89 0.84  CALCIUM 7.9*  --  7.6* 7.6*  7.9*  MG  --  1.9  --   --   --   PHOS  --  2.5  --   --   --    GFR: Estimated Creatinine Clearance: 57.7 mL/min (by C-G formula based on SCr of 0.84 mg/dL). Liver Function Tests: Recent Labs  Lab 06/02/18 1615 06/02/18 2223 06/03/18 0341 06/04/18 0318 06/05/18 0359  AST 1,049*  --  932* 1,073* 1,203*  ALT 737*  --  646* 669* 763*  ALKPHOS 217*  --  169* 167* 158*  BILITOT 8.8* 8.9* 8.8* 10.2* 12.0*  PROT 7.3  --  6.1* 6.2* 6.5  ALBUMIN 2.9*  --  2.4* 2.3* 2.2*   Recent Labs  Lab 06/02/18 1615  LIPASE 37   Recent Labs  Lab 06/02/18  2223  AMMONIA 33   Coagulation Profile: Recent Labs  Lab 06/02/18 1615 06/03/18 0341 06/04/18 0318 06/05/18 0359  INR 1.2 1.3* 1.4* 1.3*   Cardiac Enzymes: No results for input(s): CKTOTAL, CKMB, CKMBINDEX, TROPONINI in the last 168 hours. BNP (last 3 results) No results for input(s): PROBNP in the last 8760 hours. HbA1C: No results for input(s): HGBA1C in the last 72 hours. CBG: No results for input(s): GLUCAP in the last 168 hours. Lipid Profile: No results for input(s): CHOL, HDL, LDLCALC, TRIG, CHOLHDL, LDLDIRECT in the last 72 hours. Thyroid Function Tests: No results for input(s): TSH, T4TOTAL, FREET4, T3FREE, THYROIDAB in the last 72 hours. Anemia Panel: No results for input(s): VITAMINB12, FOLATE, FERRITIN, TIBC, IRON, RETICCTPCT in the last 72 hours. Sepsis Labs: No results for input(s): PROCALCITON, LATICACIDVEN in the last 168 hours.  No results found for this or any previous visit (from the past 240 hour(s)).       Radiology Studies: No results found.      Scheduled Meds: . risperiDONE  0.5 mg Oral QHS  . Tenofovir Alafenamide Fumarate  25 mg Oral Daily   Continuous Infusions:   LOS: 3 days     Cordelia Poche, MD [facility] 06/05/2018, 3:14 PM  If 7PM-7AM, please contact night-coverage [email or web address]

## 2018-06-05 NOTE — Progress Notes (Signed)
[facility] Pharmacy Progress Note   [facility] pharmacy team, Ronney Asters, was able to obtain medication assistance for Ms. Chappelle for her Pence.   Copays are covered and the medication will be shipped to her home upon discharge.   Jimmy Footman, PharmD, BCPS, BCIDP Infectious Diseases Clinical Pharmacist Phone: [phone] 06/05/2018 4:05 PM

## 2018-06-05 NOTE — Progress Notes (Signed)
[facility] PROGRESS NOTE Subjective Patient still feels very weak.  Her urine is dark and stools are light-colored.  Objective: Vital signs in last 24 hours: Temp:  [98.1 F (36.7 C)-98.9 F (37.2 C)] 98.1 F (36.7 C) (05/05 0437) Pulse Rate:  [56-59] 56 (05/05 0437) Resp:  [16-18] 16 (05/05 0437) BP: (98-117)/(57-62) 117/60 (05/05 0437) SpO2:  [96 %-99 %] 97 % (05/05 0437) Last BM Date: 06/05/18  Intake/Output from previous day: 05/04 0701 - 05/05 0700 In: 13 [P.O.:490] Out: 1010 [Urine:1010] Intake/Output this shift: Total I/O In: 240 [P.O.:240] Out: -   PE:  General--no acute distress, icteric  Abdomen--no ascites liver and spleen are not palpable or tender.  Lab Results: Recent Labs    06/02/18 1615 06/03/18 0341 06/04/18 0318 06/05/18 0359  WBC 3.2* 2.9* 3.0* 3.6*  HGB 12.1 10.0* 10.2* 10.5*  HCT 36.4 31.7* 31.7* 31.4*  PLT 150 112* 102* 113*   BMET Recent Labs    06/02/18 1615 06/03/18 0341 06/04/18 0318 06/05/18 0359  NA 138 140 139 138  K 3.3* 4.1 4.0 4.2  CL 106 113* 111 108  CO2 24 25 26 26   CREATININE 0.79 0.67 0.89 0.84   LFT Recent Labs    06/02/18 2223 06/03/18 0341 06/04/18 0318 06/05/18 0359  PROT  --  6.1* 6.2* 6.5  AST  --  932* 1,073* 1,203*  ALT  --  646* 669* 763*  ALKPHOS  --  169* 167* 158*  BILITOT 8.9* 8.8* 10.2* 12.0*  BILIDIR 5.7* 5.2*  --   --   IBILI 3.2* 3.6*  --   --    PT/INR Recent Labs    06/03/18 0341 06/04/18 0318 06/05/18 0359  LABPROT 15.9* 16.6* 16.3*  INR 1.3* 1.4* 1.3*   PANCREAS Recent Labs    06/02/18 1615  LIPASE 37         Studies/Results: No results found.  Medications: I have reviewed the patient's current medications.  Assessment:   1.  Abnormal LFTs.  Patient is likely reactivated chronic hepatitis B after stopping tenofovir.  Her hepatitis B surface antigen and hepatitis B DNA are still pending.  Have discussed with both Dr. Linus Salmons and Dr Irene Limbo.  The most  likely explanation is reactivation of hepatitis B after stopping the tenofovir. IID will see formally, and we will restart the tenofovir.   Plan: We will restart tenofovir today at 25 mg daily.   Nancy Fetter 06/05/2018, 9:19 AM  This note was created using voice recognition software. Minor errors may Have occurred unintentionally.  Pager: [phone] If no answer or after hours call [phone]

## 2018-06-05 NOTE — Care Management Important Message (Signed)
Important Message  Patient Details IM Letter given to Servando Snare to present to the Patient Name: LYNELL GREENHOUSE MRN: [id number] Date of Birth: 03/08/1945   Medicare Important Message Given:  Yes    Kerin Salen 06/05/2018, 10:18 AMImportant Message  Patient Details  Name: PRISILLA KOCSIS MRN: [id number] Date of Birth: 1945-12-08   Medicare Important Message Given:       Kerin Salen 06/05/2018, 10:18 AM

## 2018-06-05 NOTE — Consult Note (Signed)
Date of Admission:  06/02/2018          Reason for Consult: Reactivated Hepatitis B    Referring Provider: Dr. Lonny Prude   Assessment:  1. Reactivated hepatitis B 2. Follicular lymphoma  Plan:  1. [facility] 2. Will ask ID pharmacy at clinic to investigate copay assistance for Medicare pts 3. Check Hep A immunity 4. Track LFTS   Principal Problem:   Acute hepatitis Active Problems:   Hypokalemia   Chronic viral hepatitis B without delta-agent (HCC)   Follicular lymphoma grade II of lymph nodes of multiple sites ([place])   Depression   Scheduled Meds: . risperiDONE  0.5 mg Oral QHS  . Tenofovir Alafenamide Fumarate  25 mg Oral Daily   Continuous Infusions: PRN Meds:.ondansetron **OR** ondansetron (ZOFRAN) IV  HPI: Brooke Huber is a 73 y.o. female with chronic hepatitis B without delta agent having been put on Vemlidy while she was undergoing chemotherapy. She has been off of both chemotherapy and TAF for many months but now is presenting with nausea, lethargy, jaundice clay colored stools and an acute hepatitis, undoubtedly due to reactivation of her hepatitis B.  She has had problems with paying copays for [facility]. I will ask ID pharmacy to look into assistance program for Medicare.   Review of Systems: Review of Systems  Constitutional: Negative for chills, fever, malaise/fatigue and weight loss.  HENT: Negative for congestion and sore throat.   Eyes: Negative for blurred vision and photophobia.  Respiratory: Negative for cough, shortness of breath and wheezing.   Cardiovascular: Negative for chest pain, palpitations and leg swelling.  Gastrointestinal: Positive for abdominal pain. Negative for blood in stool, constipation, diarrhea, heartburn, melena, nausea and vomiting.  Genitourinary: Negative for dysuria, flank pain and hematuria.  Musculoskeletal: Negative for back pain, falls, joint pain and myalgias.  Skin: Negative for itching and rash.   Neurological: Negative for dizziness, focal weakness, loss of consciousness, weakness and headaches.  Endo/Heme/Allergies: Does not bruise/bleed easily.  Psychiatric/Behavioral: Negative for depression and suicidal ideas. The patient does not have insomnia.     Past Medical History:  Diagnosis Date  . 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 Cancer [facility])    breast lymphoma with mets  . Depression   . GERD (gastroesophageal reflux disease)   . Hepatitis   . Hepatitis B 02/1995  . 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 Smoking status: Never Smoker  . Smokeless tobacco: Never Used  Substance Use Topics  . Alcohol use: No  . Drug use: No    Family History  Problem Relation Age of Onset  . Alzheimer's disease Sister   . Dementia Sister   . Sexual abuse Sister   . Seizures Sister   . Alcohol abuse Brother   . Alcohol abuse Maternal Uncle   . Drug abuse Cousin   . Drug abuse Son   . Depression Neg Hx    Allergies  Allergen Reactions  . Penicillins Hives and Rash    Did it involve swelling of the face/tongue/throat, SOB, or low BP? N Did it involve sudden or severe rash/hives, skin peeling, or any reaction on the inside of your mouth or nose? Y Did you need to seek medical attention at a hospital or doctor's office? N When did it last happen? If all above answers are "  NO", may proceed with cephalosporin use.     OBJECTIVE: Blood pressure 117/60, pulse (!) 56, temperature 98.1 F (36.7 C), temperature source Oral, resp. rate 16, height 5\' 2"  (1.575 m), weight 78 kg, SpO2 97 %.  Physical Exam Constitutional:      General: She is not in acute distress.    Appearance: Normal appearance. She is well-developed. She is not ill-appearing or diaphoretic.  HENT:     Head: Normocephalic and atraumatic.     Right Ear: Hearing and external ear normal.     Left Ear: Hearing and external ear  normal.     Nose: No nasal deformity or rhinorrhea.  Eyes:     General: Scleral icterus present.     Conjunctiva/sclera: Conjunctivae normal.     Right eye: Right conjunctiva is not injected.     Left eye: Left conjunctiva is not injected.     Pupils: Pupils are equal, round, and reactive to light.  Neck:     Musculoskeletal: Normal range of motion and neck supple.     Vascular: No JVD.  Cardiovascular:     Rate and Rhythm: Normal rate and regular rhythm.     Heart sounds: S1 normal and S2 normal.  Abdominal:     General: Abdomen is flat. Bowel sounds are normal. There is no distension.     Palpations: Abdomen is soft. There is no mass.     Tenderness: There is no abdominal tenderness.  Musculoskeletal: Normal range of motion.     Right shoulder: Normal.     Left shoulder: Normal.     Right hip: Normal.     Left hip: Normal.     Right knee: Normal.     Left knee: Normal.  Lymphadenopathy:     Head:     Right side of head: No submandibular, preauricular or posterior auricular adenopathy.     Left side of head: No submandibular, preauricular or posterior auricular adenopathy.     Cervical: No cervical adenopathy.     Right cervical: No superficial or deep cervical adenopathy.    Left cervical: No superficial or deep cervical adenopathy.  Skin:    General: Skin is warm and dry.     Coloration: Skin is jaundiced. Skin is not pale.     Findings: No abrasion, bruising, ecchymosis, erythema, lesion or rash.     Nails: There is no clubbing.   Neurological:     General: No focal deficit present.     Mental Status: She is alert and oriented to person, place, and time.     Sensory: No sensory deficit.     Coordination: Coordination normal.     Gait: Gait normal.  Psychiatric:        Attention and Perception: She is attentive.        Mood and Affect: Mood normal.        Speech: Speech normal.        Behavior: Behavior normal. Behavior is cooperative.        Thought Content:  Thought content normal.        Judgment: Judgment normal.     Lab Results Lab Results  Component Value Date   WBC 3.6 (L) 06/05/2018   HGB 10.5 (L) 06/05/2018   HCT 31.4 (L) 06/05/2018   MCV 92.6 06/05/2018   PLT 113 (L) 06/05/2018    Lab Results  Component Value Date   CREATININE 0.84 06/05/2018   BUN 11 06/05/2018   NA 138 06/05/2018  K 4.2 06/05/2018   CL 108 06/05/2018   CO2 26 06/05/2018    Lab Results  Component Value Date   ALT 763 (H) 06/05/2018   AST 1,203 (H) 06/05/2018   ALKPHOS 158 (H) 06/05/2018   BILITOT 12.0 (H) 06/05/2018     Microbiology: No results found for this or any previous visit (from the past 240 hour(s)).  [name], [place] [facility] Infectious Disease [facility] [phone] pager  06/05/2018, 12:29 PM

## 2018-06-06 DIAGNOSIS — E669 Obesity, unspecified: Secondary | ICD-10-CM

## 2018-06-06 DIAGNOSIS — K7200 Acute and subacute hepatic failure without coma: Secondary | ICD-10-CM

## 2018-06-06 DIAGNOSIS — D61818 Other pancytopenia: Secondary | ICD-10-CM

## 2018-06-06 DIAGNOSIS — F329 Major depressive disorder, single episode, unspecified: Secondary | ICD-10-CM

## 2018-06-06 LAB — COMPREHENSIVE METABOLIC PANEL
ALT: 825 U/L — ABNORMAL HIGH (ref 0–44)
AST: 1370 U/L — ABNORMAL HIGH (ref 15–41)
Albumin: 2.2 g/dL — ABNORMAL LOW (ref 3.5–5.0)
Alkaline Phosphatase: 149 U/L — ABNORMAL HIGH (ref 38–126)
Anion gap: 3 — ABNORMAL LOW (ref 5–15)
BUN: 9 mg/dL (ref 8–23)
CO2: 26 mmol/L (ref 22–32)
Calcium: 8 mg/dL — ABNORMAL LOW (ref 8.9–10.3)
Chloride: 107 mmol/L (ref 98–111)
Creatinine, Ser: 0.85 mg/dL (ref 0.44–1.00)
GFR calc Af Amer: >60 mL/min (ref >60)
GFR calc non Af Amer: >60 mL/min (ref >60)
Glucose, Bld: 92 mg/dL (ref 70–99)
Potassium: 4 mmol/L (ref 3.5–5.1)
Sodium: 136 mmol/L (ref 135–145)
Total Bilirubin: 14.2 mg/dL — ABNORMAL HIGH (ref 0.3–1.2)
Total Protein: 6.5 g/dL (ref 6.5–8.1)

## 2018-06-06 LAB — CBC
HCT: 31 % — ABNORMAL LOW (ref 36.0–46.0)
Hemoglobin: 10.5 g/dL — ABNORMAL LOW (ref 12.0–15.0)
MCH: 31.1 pg (ref 26.0–34.0)
MCHC: 33.9 g/dL (ref 30.0–36.0)
MCV: 91.7 fL (ref 80.0–100.0)
Platelets: 111 10*3/uL — ABNORMAL LOW (ref 150–400)
RBC: 3.38 MIL/uL — ABNORMAL LOW (ref 3.87–5.11)
RDW: 17.1 % — ABNORMAL HIGH (ref 11.5–15.5)
WBC: 4.5 10*3/uL (ref 4.0–10.5)
nRBC: 0 % (ref 0.0–0.2)

## 2018-06-06 LAB — HEPATITIS B DNA, ULTRAQUANTITATIVE, PCR

## 2018-06-06 LAB — PROTIME-INR
INR: 1.5 — ABNORMAL HIGH (ref 0.8–1.2)
Prothrombin Time: 17.4 seconds — ABNORMAL HIGH (ref 11.4–15.2)

## 2018-06-06 LAB — HBV REAL-TIME PCR, QUANT
HBV AS IU/ML: 164000000 IU/mL
LOG10 HBV AS IU/ML: 8.215 log10 IU/mL

## 2018-06-06 NOTE — Progress Notes (Signed)
Subjective:  She is concerned about whether her liver will respond to return to her normal   Antibiotics:  Anti-infectives (From admission, onward)   Start     Dose/Rate Route Frequency Ordered Stop   06/05/18 1600  Tenofovir Alafenamide Fumarate TABS 25 mg     25 mg Oral Daily 06/05/18 0942     06/05/18 1400  Tenofovir Alafenamide Fumarate TABS 25 mg  Status:  Discontinued     25 mg Oral Daily 06/05/18 0941 06/05/18 0941   06/05/18 0000  Tenofovir Alafenamide Fumarate (VEMLIDY) 25 MG TABS     25 mg Oral Daily 06/05/18 1106        Medications: Scheduled Meds: . risperiDONE  0.5 mg Oral QHS  . Tenofovir Alafenamide Fumarate  25 mg Oral Daily   Continuous Infusions: PRN Meds:.ondansetron **OR** ondansetron (ZOFRAN) IV    Objective: Weight change:   Intake/Output Summary (Last 24 hours) at 06/06/2018 1453 Last data filed at 06/06/2018 1405 Gross per 24 hour  Intake 1140 ml  Output 1850 ml  Net -710 ml   Blood pressure (!) 99/48, pulse (!) 57, temperature 98.1 F (36.7 C), temperature source Oral, resp. rate 16, height 5\' 2"  (1.575 m), weight 78 kg, SpO2 95 %. Temp:  [98.1 F (36.7 C)-99.3 F (37.4 C)] 98.1 F (36.7 C) (05/06 1333) Pulse Rate:  [57-58] 57 (05/06 1333) Resp:  [16-18] 16 (05/06 1333) BP: (99-146)/(48-74) 99/48 (05/06 1333) SpO2:  [95 %-99 %] 95 % (05/06 1333)  Physical Exam: General: Alert and awake, oriented x3, not in any acute distress. HEENT: nicteric sclera, EOMI CVS regular rate, normal  Chest: , no wheezing, no respiratory distress Abdomen: soft non-distended,  Extremities: no edema or deformity noted bilaterally Skin: no rashes Neuro: nonfocal  CBC:    BMET Recent Labs    06/05/18 0359 06/06/18 0432  NA 138 136  K 4.2 4.0  CL 108 107  CO2 26 26  GLUCOSE 94 92  BUN 11 9  CREATININE 0.84 0.85  CALCIUM 7.9* 8.0*     Liver Panel  Recent Labs    06/05/18 0359 06/06/18 0432  PROT 6.5 6.5  ALBUMIN 2.2* 2.2*   AST 1,203* 1,370*  ALT 763* 825*  ALKPHOS 158* 149*  BILITOT 12.0* 14.2*       Sedimentation Rate No results for input(s): ESRSEDRATE in the last 72 hours. C-Reactive Protein No results for input(s): CRP in the last 72 hours.  Micro Results: No results found for this or any previous visit (from the past 720 hour(s)).  Studies/Results: No results found.    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 female with reactivation of her chronic hepatitis B after stopping Vemlidy this fall which she was taking as a prophylactic measure in the context of chemotherapy.  1.  Acute hepatitis B flare: --Continue Vemlidy --Grateful to infectious disease pharmacy for being able to secure patient assistance of the patient will not have to incur cost.  Plan is to ship her medications to her house when she discharges.  We will need to be cautious about when she is discharged since if this happens over the weekend I doubt she will  have the medications at her house.     LOS: 4 days   [name] 06/06/2018, 2:53 PM

## 2018-06-06 NOTE — Progress Notes (Signed)
[facility] PROGRESS NOTE Subjective Patient is feeling okay but still feels somewhat nauseated and sick.  Has been seen by ID.  They are working on getting her assistance to pay for her meds.  She has had 2 pills so far.  She was on the tenofovir in the past. Objective: Vital signs in last 24 hours: Temp:  [98.5 F (36.9 C)-99.3 F (37.4 C)] 98.5 F (36.9 C) (05/06 0510) Pulse Rate:  [57-58] 58 (05/06 0510) Resp:  [16-18] 16 (05/06 0510) BP: (107-146)/(55-74) 146/74 (05/06 0510) SpO2:  [95 %-100 %] 95 % (05/06 0510) Last BM Date: 06/05/18  Intake/Output from previous day: 05/05 0701 - 05/06 0700 In: 1500 [P.O.:1500] Out: 1500 [Urine:1500] Intake/Output this shift: Total I/O In: 120 [P.O.:120] Out: 0     Lab Results: Recent Labs    06/04/18 0318 06/05/18 0359 06/06/18 0432  WBC 3.0* 3.6* 4.5  HGB 10.2* 10.5* 10.5*  HCT 31.7* 31.4* 31.0*  PLT 102* 113* 111*   BMET Recent Labs    06/04/18 0318 06/05/18 0359 06/06/18 0432  NA 139 138 136  K 4.0 4.2 4.0  CL 111 108 107  CO2 26 26 26   CREATININE 0.89 0.84 0.85   LFT Recent Labs    06/04/18 0318 06/05/18 0359 06/06/18 0432  PROT 6.2* 6.5 6.5  AST 1,073* 1,203* 1,370*  ALT 669* 763* 825*  ALKPHOS 167* 158* 149*  BILITOT 10.2* 12.0* 14.2*   PT/INR Recent Labs    06/04/18 0318 06/05/18 0359 06/06/18 0432  LABPROT 16.6* 16.3* 17.4*  INR 1.4* 1.3* 1.5*   PANCREAS No results for input(s): LIPASE in the last 72 hours.       Studies/Results: No results found.  Medications: I have reviewed the patient's current medications.  Assessment:   1.  Hepatitis B.  This appears to be a reactivation after tenofovir was stopped.  For unclear reasons the hepatitis B surface antigen test is still not back after 4 days and hepatitis B DNA is still pending.   Plan: Would continue tenofovir 25 mg daily.  Will likely take several days before her liver test stabilized and hopefully this will happen  soon.  She will need close follow-up as an outpatient and should probably go back and see Dr. Novella Olive who started this treatment initially.   Nancy Fetter 06/06/2018, 10:35 AM  This note was created using voice recognition software. Minor errors may Have occurred unintentionally.  Pager: [phone] If no answer or after hours call [phone]

## 2018-06-06 NOTE — Progress Notes (Signed)
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  Patient has been feeling bad since 05/23/2018 and reported soft stools occurring every other day with yellow discoloration.  Family notes that her skin and a more yellowish hue.  Recently she has been receiving chemotherapy for follicular lymphoma at that time which was treated with tenderness.  To prevent remission of her hepatitis B however she stopped taking this after she completed her chemotherapy in December 2019.  She presented and has acute on chronic hepatitis B likely in the setting of to benefit discontinuation.  Gastroenterology and Infectious diseases were consulted for further evaluation recommendations and she underwent further work-up.  She is now back on to interfere however LFTs are still slightly worsening  Assessment & Plan:   Principal Problem:   Acute hepatitis Active Problems:   Hypokalemia   Chronic viral hepatitis B without delta-agent (HCC)   Follicular lymphoma grade II of lymph nodes of multiple sites [facility])   Depression  Acute Hepatitis and likely re-activation of Chronic Hepatitis B Abnormal LFTs, worsening  Hyperbilirubinemia, worsening -LFTs and T Bili significantly elevated compared to relatively normal labs on 05/16/2018.   -AST is now 1,370, ALT is 825, and T Bili is 14.2; PT is 17.4 and INR is 1.5 -Abdominal ultrasound showed normal portal venous flow, CBD diameter 3.4 mm.  Gallbladder was unable to be visualized due to overlying bowel versus  decompressed gallbladder.   -Viral hepatitis is suspected.  Patient currently alert and oriented without abdominal pain. -Checked hepatitis A, B, and C studies.  Hepatitis D also a possibility with known hep B -this is a send out study -Hep A Ab, IgM negative, Hep B Surface Ag <3:1, HBsAg Conf Positive, HCV Ab 0.4; Hep B Cort Total Ab is POSITIVE -Checked HIV Ab, and Non-reactive  APAP level was <10; Ammonia Level was 33 -Supportive IV fluids now D/C'dd -Continue to Monitor and Trend Hepatic Function -GI Consulted and recommending continuing Tenofovir 25 mg po Daily and that it will take several days before her LFT's stablize -ID was consulted and recommended restarting Tenofovir -Repeat CMP in AM   Hypokalemia -Improved. K+ was 4.0 -Continue to monitor and replete as necessary -Repeat CMP in the a.m.  Stage IV Follicular Lymphoma -Follows with oncology Dr. Irene Limbo. Status post chemotherapy of  3 cycles of Bendamustine/Rituxan followed by 2 cycles of only Rituxan (due to neutropenia)(completed 10/2017) with complete metabolic response per last oncology note 05/23/2018.   -CT chest/abdomen/pelvis 05/17/2018 showed no residual or recurrent lymphadenopathy.  MDD with psychosis/PTSD/Panic disorder: -Follows with behavioral health, currently stable on home regimen of Zoloft 50 mg daily and Risperdal 0.5 mg nightly. -Continue Risperidone 0.5 mg po qHS -Hold Zoloft for now given liver dysfunction  Ectatic Abdominal Aorta -Seen on abdominal ultrasound, abdominal aorta measures 2.8 cm.   -Follow-up aortic ultrasound in 5 years recommended as an outpatient  Obesity -Estimated body mass index is 31.46 kg/m as calculated from the following:   Height as of this encounter: 5\' 2"  (1.575 m).   Weight as of this encounter: 78 kg. -Weight  Loss and Dietary Counseling given   Pancytopenia -In the Setting of Chronic Disease and Follicular Lymphoma -WBC went from 2.9 at its lowest and is now  4.5 -Hb/Hct is stable at 10.5/31.0 -Platelet Count went from 102 -> 111 -S/p Chemotherapy  -Check Anemia Panel in the AM -Continue to Monitor for S/Sx of Bleeding -Repeat CBC in AM   DVT prophylaxis: SCDs Code Status: FULL CODE  Family Communication: No family present at bedside  Disposition Plan: D/C Home when Hepatic Function starts to improve  Consultants:   [facility]  Infectious Diseases   Procedures: None  Antimicrobials:  Anti-infectives (From admission, onward)   Start     Dose/Rate Route Frequency Ordered Stop   06/05/18 1600  Tenofovir Alafenamide Fumarate TABS 25 mg     25 mg Oral Daily 06/05/18 0942     06/05/18 1400  Tenofovir Alafenamide Fumarate TABS 25 mg  Status:  Discontinued     25 mg Oral Daily 06/05/18 0941 06/05/18 0941   06/05/18 0000  Tenofovir Alafenamide Fumarate (VEMLIDY) 25 MG TABS     25 mg Oral Daily 06/05/18 1106       Subjective: Seen and examined and states that she was nauseous this morning and vomited.  No nausea or vomiting now.  Denies any chest pain, lightheadedness or dizziness.  States urine is still dark.  No other concerns or complaints at this time and thinks this may have happened because she stopped the Tenofovir.   Objective: Vitals:   06/05/18 0437 06/05/18 1338 06/05/18 2203 06/06/18 0510  BP: 117/60 (!) 107/55 (!) 131/55 (!) 146/74  Pulse: (!) 56 (!) 58 (!) 57 (!) 58  Resp: 16 18 18 16   Temp: 98.1 F (36.7 C) 99.2 F (37.3 C) 99.3 F (37.4 C) 98.5 F (36.9 C)  TempSrc: Oral Oral Oral Oral  SpO2: 97% 100% 99% 95%  Weight:      Height:        Intake/Output Summary (Last 24 hours) at 06/06/2018 1120 Last data filed at 06/06/2018 1000 Gross per 24 hour  Intake 900 ml  Output 1500 ml  Net -600 ml   Filed Weights   06/02/18 1533  Weight: 78 kg   Examination: Physical Exam:  Constitutional: WN/WD obese Caucasian female in NAD and appears calm and comfortable Eyes: Sclerae icteric  ENMT: External  Ears, Nose appear normal. Grossly normal hearing. Mucous membranes are moist.  Neck: Appears normal, supple, no cervical masses, normal ROM, no appreciable thyromegaly; no JVD Respiratory: Diminished to auscultation bilaterally, no wheezing, rales, rhonchi or crackles. Normal respiratory effort and patient is not tachypenic. No accessory muscle use.  Cardiovascular: RRR, no murmurs / rubs / gallops. S1 and S2 auscultated. Trace extremity edema.  Abdomen: Soft, non-tender, Distended slightly. No masses palpated. No appreciable hepatosplenomegaly. Bowel sounds positive x4.  GU: Deferred. Musculoskeletal: No clubbing / cyanosis of digits/nails. No joint deformity upper and lower extremities.   Skin: Has jaundiced skin but no rashes, lesions, ulcers. No induration; Warm and dry.  Neurologic: CN 2-12 grossly intact with no focal deficits.  Romberg sign and cerebellar reflexes not assessed.  Psychiatric: Normal judgment and insight. Alert and oriented x 3. Normal mood and appropriate affect.   Data Reviewed: I have personally reviewed following labs and imaging studies  CBC: Recent Labs  Lab 06/02/18 1615 06/03/18 0341 06/04/18 0318 06/05/18 0359 06/06/18 0432  WBC 3.2* 2.9* 3.0* 3.6* 4.5  NEUTROABS 1.9  --   --   --   --  HGB 12.1 10.0* 10.2* 10.5* 10.5*  HCT 36.4 31.7* 31.7* 31.4* 31.0*  MCV 92.9 96.1 94.3 92.6 91.7  PLT 150 112* 102* 113* 226*   Basic Metabolic Panel: Recent Labs  Lab 06/02/18 1615 06/02/18 2223 06/03/18 0341 06/04/18 0318 06/05/18 0359 06/06/18 0432  NA 138  --  140 139 138 136  K 3.3*  --  4.1 4.0 4.2 4.0  CL 106  --  113* 111 108 107  CO2 24  --  25 26 26 26   GLUCOSE 110*  --  85 92 94 92  BUN 14  --  12 13 11 9   CREATININE 0.79  --  0.67 0.89 0.84 0.85  CALCIUM 7.9*  --  7.6* 7.6* 7.9* 8.0*  MG  --  1.9  --   --   --   --   PHOS  --  2.5  --   --   --   --    GFR: Estimated Creatinine Clearance: 57 mL/min (by C-G formula based on SCr of 0.85  mg/dL). Liver Function Tests: Recent Labs  Lab 06/02/18 1615 06/02/18 2223 06/03/18 0341 06/04/18 0318 06/05/18 0359 06/06/18 0432  AST 1,049*  --  932* 1,073* 1,203* 1,370*  ALT 737*  --  646* 669* 763* 825*  ALKPHOS 217*  --  169* 167* 158* 149*  BILITOT 8.8* 8.9* 8.8* 10.2* 12.0* 14.2*  PROT 7.3  --  6.1* 6.2* 6.5 6.5  ALBUMIN 2.9*  --  2.4* 2.3* 2.2* 2.2*   Recent Labs  Lab 06/02/18 1615  LIPASE 37   Recent Labs  Lab 06/02/18 2223  AMMONIA 33   Coagulation Profile: Recent Labs  Lab 06/02/18 1615 06/03/18 0341 06/04/18 0318 06/05/18 0359 06/06/18 0432  INR 1.2 1.3* 1.4* 1.3* 1.5*   Cardiac Enzymes: No results for input(s): CKTOTAL, CKMB, CKMBINDEX, TROPONINI in the last 168 hours. BNP (last 3 results) No results for input(s): PROBNP in the last 8760 hours. HbA1C: No results for input(s): HGBA1C in the last 72 hours. CBG: No results for input(s): GLUCAP in the last 168 hours. Lipid Profile: No results for input(s): CHOL, HDL, LDLCALC, TRIG, CHOLHDL, LDLDIRECT in the last 72 hours. Thyroid Function Tests: No results for input(s): TSH, T4TOTAL, FREET4, T3FREE, THYROIDAB in the last 72 hours. Anemia Panel: No results for input(s): VITAMINB12, FOLATE, FERRITIN, TIBC, IRON, RETICCTPCT in the last 72 hours. Sepsis Labs: No results for input(s): PROCALCITON, LATICACIDVEN in the last 168 hours.  No results found for this or any previous visit (from the past 240 hour(s)).   Radiology Studies: No results found.   Scheduled Meds:  risperiDONE  0.5 mg Oral QHS   Tenofovir Alafenamide Fumarate  25 mg Oral Daily   Continuous Infusions:   LOS: 4 days   Kerney Elbe, DO [facility] PAGER is on AMION  If 7PM-7AM, please contact night-coverage [email or web address] Password [email or web address] 06/06/2018, 11:20 AM

## 2018-06-07 LAB — CBC WITH DIFFERENTIAL/PLATELET
Abs Immature Granulocytes: 0.08 10*3/uL — ABNORMAL HIGH (ref 0.00–0.07)
Basophils Absolute: 0 10*3/uL (ref 0.0–0.1)
Basophils Relative: 1 %
Eosinophils Absolute: 0.1 10*3/uL (ref 0.0–0.5)
Eosinophils Relative: 2 %
HCT: 31.5 % — ABNORMAL LOW (ref 36.0–46.0)
Hemoglobin: 10.7 g/dL — ABNORMAL LOW (ref 12.0–15.0)
Immature Granulocytes: 2 %
Lymphocytes Relative: 14 %
Lymphs Abs: 0.6 10*3/uL — ABNORMAL LOW (ref 0.7–4.0)
MCH: 30.9 pg (ref 26.0–34.0)
MCHC: 34 g/dL (ref 30.0–36.0)
MCV: 91 fL (ref 80.0–100.0)
Monocytes Absolute: 0.7 10*3/uL (ref 0.1–1.0)
Monocytes Relative: 15 %
Neutro Abs: 3 10*3/uL (ref 1.7–7.7)
Neutrophils Relative %: 66 %
Platelets: 126 10*3/uL — ABNORMAL LOW (ref 150–400)
RBC: 3.46 MIL/uL — ABNORMAL LOW (ref 3.87–5.11)
RDW: 17.4 % — ABNORMAL HIGH (ref 11.5–15.5)
WBC: 4.5 10*3/uL (ref 4.0–10.5)
nRBC: 0 % (ref 0.0–0.2)

## 2018-06-07 LAB — IRON AND TIBC
Iron: 124 ug/dL (ref 28–170)
Saturation Ratios: 85 % — ABNORMAL HIGH (ref 10.4–31.8)
TIBC: 146 ug/dL — ABNORMAL LOW (ref 250–450)
UIBC: 22 ug/dL

## 2018-06-07 LAB — RETICULOCYTES
Immature Retic Fract: 24.4 % — ABNORMAL HIGH (ref 2.3–15.9)
RBC.: 3.46 MIL/uL — ABNORMAL LOW (ref 3.87–5.11)
Retic Count, Absolute: 79.2 10*3/uL (ref 19.0–186.0)
Retic Ct Pct: 2.3 % (ref 0.4–3.1)

## 2018-06-07 LAB — MAGNESIUM: Magnesium: 1.9 mg/dL (ref 1.7–2.4)

## 2018-06-07 LAB — COMPREHENSIVE METABOLIC PANEL
ALT: 945 U/L — ABNORMAL HIGH (ref 0–44)
AST: 1586 U/L — ABNORMAL HIGH (ref 15–41)
Albumin: 2.2 g/dL — ABNORMAL LOW (ref 3.5–5.0)
Alkaline Phosphatase: 140 U/L — ABNORMAL HIGH (ref 38–126)
Anion gap: 5 (ref 5–15)
BUN: 11 mg/dL (ref 8–23)
CO2: 25 mmol/L (ref 22–32)
Calcium: 8 mg/dL — ABNORMAL LOW (ref 8.9–10.3)
Chloride: 106 mmol/L (ref 98–111)
Creatinine, Ser: 0.82 mg/dL (ref 0.44–1.00)
GFR calc Af Amer: >60 mL/min (ref >60)
GFR calc non Af Amer: >60 mL/min (ref >60)
Glucose, Bld: 90 mg/dL (ref 70–99)
Potassium: 3.7 mmol/L (ref 3.5–5.1)
Sodium: 136 mmol/L (ref 135–145)
Total Bilirubin: 15.5 mg/dL — ABNORMAL HIGH (ref 0.3–1.2)
Total Protein: 6.7 g/dL (ref 6.5–8.1)

## 2018-06-07 LAB — FERRITIN: Ferritin: 2187 ng/mL — ABNORMAL HIGH (ref 11–307)

## 2018-06-07 LAB — PHOSPHORUS: Phosphorus: 4 mg/dL (ref 2.5–4.6)

## 2018-06-07 LAB — PROTIME-INR
INR: 1.4 — ABNORMAL HIGH (ref 0.8–1.2)
Prothrombin Time: 17.4 seconds — ABNORMAL HIGH (ref 11.4–15.2)

## 2018-06-07 LAB — VITAMIN B12: Vitamin B-12: 1288 pg/mL — ABNORMAL HIGH (ref 180–914)

## 2018-06-07 LAB — FOLATE: Folate: 33.7 ng/mL (ref >5.9)

## 2018-06-07 NOTE — Progress Notes (Signed)
Subjective:  No new complaints  Antibiotics:  Anti-infectives (From admission, onward)   Start     Dose/Rate Route Frequency Ordered Stop   06/05/18 1600  Tenofovir Alafenamide Fumarate TABS 25 mg     25 mg Oral Daily 06/05/18 0942     06/05/18 1400  Tenofovir Alafenamide Fumarate TABS 25 mg  Status:  Discontinued     25 mg Oral Daily 06/05/18 0941 06/05/18 0941   06/05/18 0000  Tenofovir Alafenamide Fumarate (VEMLIDY) 25 MG TABS     25 mg Oral Daily 06/05/18 1106        Medications: Scheduled Meds: . risperiDONE  0.5 mg Oral QHS  . Tenofovir Alafenamide Fumarate  25 mg Oral Daily   Continuous Infusions: PRN Meds:.ondansetron **OR** ondansetron (ZOFRAN) IV    Objective: Weight change:   Intake/Output Summary (Last 24 hours) at 06/07/2018 1445 Last data filed at 06/07/2018 1303 Gross per 24 hour  Intake 960 ml  Output 1300 ml  Net -340 ml   Blood pressure (!) 103/54, pulse 69, temperature 98.1 F (36.7 C), temperature source Oral, resp. rate 14, height 5\' 2"  (1.575 m), weight 78 kg, SpO2 99 %. Temp:  [98.1 F (36.7 C)-98.7 F (37.1 C)] 98.1 F (36.7 C) (05/07 1329) Pulse Rate:  [57-69] 69 (05/07 1329) Resp:  [14-20] 14 (05/07 1329) BP: (103-110)/(49-55) 103/54 (05/07 1329) SpO2:  [97 %-99 %] 99 % (05/07 1329)  Physical Exam: General: Alert and awake, oriented x3, not in any acute distress. HEENT: nicteric sclera, EOMI CVS regular rate, normal  Chest: , no wheezing, no respiratory distress Abdomen: soft non-distended,  Extremities: no edema or deformity noted bilaterally Skin: no rashes Neuro: nonfocal  CBC:    BMET Recent Labs    06/06/18 0432 06/07/18 0431  NA 136 136  K 4.0 3.7  CL 107 106  CO2 26 25  GLUCOSE 92 90  BUN 9 11  CREATININE 0.85 0.82  CALCIUM 8.0* 8.0*     Liver Panel  Recent Labs    06/06/18 0432 06/07/18 0431  PROT 6.5 6.7  ALBUMIN 2.2* 2.2*  AST 1,370* 1,586*  ALT 825* 945*  ALKPHOS 149* 140*  BILITOT  14.2* 15.5*       Sedimentation Rate No results for input(s): ESRSEDRATE in the last 72 hours. C-Reactive Protein No results for input(s): CRP in the last 72 hours.  Micro Results: No results found for this or any previous visit (from the past 720 hour(s)).  Studies/Results: No results found.    Assessment/Plan:  INTERVAL HISTORY: LFTS still trending up    Principal Problem:   Acute hepatitis Active Problems:   Hypokalemia   Chronic viral hepatitis B without delta-agent (HCC)   Follicular lymphoma grade II of lymph nodes of multiple sites [facility])   Depression   Acute liver failure without hepatic coma    Brooke Huber is a 73 y.o. female with reactivation of her chronic hepatitis B after stopping Vemlidy this fall which she was taking as a prophylactic measure in the context of chemotherapy.  1.  Acute hepatitis B flare: --Continue Vemlidy --Grateful to infectious disease pharmacy for being able to secure patient assistance of the patient will not have to incur cost.  Plan is to ship her medications to her house when she discharges.  I would keep her at minimum through Monday to ensure she is getting better and HAS ACCESS to uninterrupted Vemlidy therapy     LOS: 5  days   [name] 06/07/2018, 2:45 PM

## 2018-06-07 NOTE — Progress Notes (Signed)
[facility] PROGRESS NOTE Subjective Patient reports that she is feeling just a little bit better  Objective: Vital signs in last 24 hours: Temp:  [98.1 F (36.7 C)-98.7 F (37.1 C)] 98.2 F (36.8 C) (05/07 0437) Pulse Rate:  [57-64] 57 (05/07 0437) Resp:  [16-20] 18 (05/07 0437) BP: (99-110)/(48-55) 109/49 (05/07 0437) SpO2:  [95 %-98 %] 98 % (05/07 0437) Last BM Date: 06/06/18  Intake/Output from previous day: 05/06 0701 - 05/07 0700 In: 1080 [P.O.:1080] Out: 1200 [Urine:1200] Intake/Output this shift: Total I/O In: 240 [P.O.:240] Out: -   PE: General--still jaundice Heart-- Lungs-- Abdomen--  Lab Results: Recent Labs    06/05/18 0359 06/06/18 0432 06/07/18 0431  WBC 3.6* 4.5 4.5  HGB 10.5* 10.5* 10.7*  HCT 31.4* 31.0* 31.5*  PLT 113* 111* 126*   BMET Recent Labs    06/05/18 0359 06/06/18 0432 06/07/18 0431  NA 138 136 136  K 4.2 4.0 3.7  CL 108 107 106  CO2 26 26 25   CREATININE 0.84 0.85 0.82   LFT Recent Labs    06/05/18 0359 06/06/18 0432 06/07/18 0431  PROT 6.5 6.5 6.7  AST 1,203* 1,370* 1,586*  ALT 763* 825* 945*  ALKPHOS 158* 149* 140*  BILITOT 12.0* 14.2* 15.5*   PT/INR Recent Labs    06/05/18 0359 06/06/18 0432 06/07/18 0431  LABPROT 16.3* 17.4* 17.4*  INR 1.3* 1.5* 1.4*   PANCREAS No results for input(s): LIPASE in the last 72 hours.       Studies/Results: No results found.  Medications: I have reviewed the patient's current medications.  Assessment:   1.  Hepatitis B.  She is only had the third tablet of tenofovir.  Liver tests are still creeping up.  The hepatitis B DNA test says is completed but there appears to be no way to actually determine the results.  The hepatitis B surface antigen was confirmed to be positive.   Plan: Hopefully she will respond.  She was on remission with this medication before.  Will take her more than 3 days to have a clear response.   Nancy Fetter 06/07/2018, 9:34  AM  This note was created using voice recognition software. Minor errors may Have occurred unintentionally.  Pager: [phone] If no answer or after hours call [phone]

## 2018-06-07 NOTE — Progress Notes (Signed)
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  Patient has been feeling bad since 05/23/2018 and reported soft stools occurring every other day with yellow discoloration.  Family notes that her skin and a more yellowish hue.  Recently she has been receiving chemotherapy for follicular lymphoma at that time which was treated with tenderness.  To prevent remission of her hepatitis B however she stopped taking this after she completed her chemotherapy in December 2019.  She presented and has acute on chronic hepatitis B likely in the setting of to benefit discontinuation.  Gastroenterology and Infectious diseases were consulted for further evaluation recommendations and she underwent further work-up.  She is now back on to Tenofovir, however LFTs are still slightly worsening  Assessment & Plan:   Principal Problem:   Acute hepatitis Active Problems:   Hypokalemia   Chronic viral hepatitis B without delta-agent (HCC)   Follicular lymphoma grade II of lymph nodes of multiple sites [facility])   Depression   Acute liver failure without hepatic coma  Acute Hepatitis and likely re-activation of Chronic Hepatitis B and Hep B Flare  Abnormal LFTs, worsening  Hyperbilirubinemia, worsening -LFTs and T Bili significantly elevated compared to relatively normal labs on 05/16/2018.   -AST is now 1586 (1,370), ALT is 945 (825), and T Bili is 15.5 (14.2); PT is 17.4 and INR is now 1.4 -Abdominal ultrasund showed normal portal venous flow, CBD  diameter 3.4 mm.  Gallbladder was unable to be visualized due to overlying bowel versus decompressed gallbladder.   -Viral hepatitis is suspected.  Patient currently alert and oriented without abdominal pain. -Checked hepatitis A, B, and C studies.  Hepatitis D also a possibility with known hep B -this is a send out study -Hep A Ab, IgM negative, Hep B Surface Ag <3:1, HBsAg Conf Positive, HCV Ab 0.4; Hep B Cort Total Ab is POSITIVE -Checked HIV Ab, and Non-reactive  APAP level was <10; Ammonia Level was 33 -Supportive IV fluids now D/C'd -Continue to Monitor and Trend Hepatic Function -GI Consulted and recommending continuing Tenofovir 25 mg po Daily and that it will take several days before her LFT's stablize -ID was consulted and recommended restarting Tenofovir and they have procured this for the patient at D/C  -Repeat CMP in AM   Hypokalemia -Improved. K+ was 3.7 this AM  -Continue to monitor and replete as necessary -Repeat CMP in the a.m.  Stage IV Follicular Lymphoma -Follows with oncology Dr. Irene Limbo. Status post chemotherapy of  3 cycles of Bendamustine/Rituxan followed by 2 cycles of only Rituxan (due to neutropenia)(completed 10/2017) with complete metabolic response per last oncology note 05/23/2018.   -CT chest/abdomen/pelvis 05/17/2018 showed no residual or recurrent lymphadenopathy.  MDD with psychosis/PTSD/Panic disorder: -Follows with behavioral health, currently stable on home regimen of Zoloft 50 mg daily and Risperdal 0.5 mg nightly. -Continue Risperidone 0.5 mg po qHS -Hold Zoloft for now given liver dysfunction  Ectatic Abdominal Aorta -Seen on abdominal ultrasound, abdominal aorta measures 2.8 cm.   -Follow-up aortic ultrasound in 5 years recommended as an outpatient  Obesity -Estimated body  mass index is 31.46 kg/m as calculated from the following:   Height as of this encounter: 5\' 2"  (1.575 m).   Weight as of this encounter: 78 kg. -Weight Loss and Dietary  Counseling given   Pancytopenia, improving  -In the Setting of Chronic Disease and Follicular Lymphoma -WBC went from 2.9 at its lowest and is now 4.5 -Hb/Hct is stable at 10.7/31.5 -Platelet Count went from 102 -> 111 -> 126 -S/p Chemotherapy  -Checked Anemia Panel and showed iron level of 124, U IBC of 22, TIBC 146, saturation ratios of 85%, ferritin level 2187, folate level of 33.7, vitamin B12 level 1288 -Continue to Monitor for S/Sx of Bleeding -Repeat CBC in AM   DVT prophylaxis: SCDs Code Status: FULL CODE  Family Communication: No family present at bedside  Disposition Plan: D/C Home when Hepatic Function starts to improve  Consultants:   [facility]  Infectious Diseases   Procedures: None  Antimicrobials:  Anti-infectives (From admission, onward)   Start     Dose/Rate Route Frequency Ordered Stop   06/05/18 1600  Tenofovir Alafenamide Fumarate TABS 25 mg     25 mg Oral Daily 06/05/18 0942     06/05/18 1400  Tenofovir Alafenamide Fumarate TABS 25 mg  Status:  Discontinued     25 mg Oral Daily 06/05/18 0941 06/05/18 0941   06/05/18 0000  Tenofovir Alafenamide Fumarate (VEMLIDY) 25 MG TABS     25 mg Oral Daily 06/05/18 1106       Subjective: Seen and examined at bedside and states that she was nauseous this morning but did not actually vomit.  Is concerned about her liver functions worsening.  No chest pain, lightheadedness or dizziness and denies any abdominal pain.  No other concerns complaints at this time and hopeful that her liver function starts improving soon.  Objective: Vitals:   06/06/18 0510 06/06/18 1333 06/06/18 2044 06/07/18 0437  BP: (!) 146/74 (!) 99/48 (!) 110/55 (!) 109/49  Pulse: (!) 58 (!) 57 64 (!) 57  Resp: 16 16 20 18   Temp: 98.5 F (36.9 C) 98.1 F (36.7 C) 98.7 F (37.1 C) 98.2 F (36.8 C)  TempSrc: Oral Oral Oral Oral  SpO2: 95% 95% 97% 98%  Weight:      Height:        Intake/Output Summary (Last 24 hours) at 06/07/2018  1059 Last data filed at 06/07/2018 1000 Gross per 24 hour  Intake 1200 ml  Output 1200 ml  Net 0 ml   Filed Weights   06/02/18 1533  Weight: 78 kg   Examination: Physical Exam:  Constitutional:, Well-developed obese Caucasian female currently no acute distress appears calm but it appears anxious about her liver functions worsening Eyes: Sclera is icteric ENMT: External ears and nose appear normal.  Mucous murmurs are moist Neck: Appears supple no JVD Respiratory: Diminished auscultation bilaterally no appreciable wheezing, rales, or.  Patient not tachypneic or using accessory muscles breathe Cardiovascular: Regular rate and rhythm.  No appreciable murmurs, rubs or gallop.  Has trace lower extremity edema Abdomen: Soft, nontender, distended slightly.  Bowel sounds present GU: Deferred Musculoskeletal: No contractures or cyanosis.  No joint deformities upper lower extremities Skin: Has a jaundiced hue but no appreciable rashes or lesions on limited skin evaluation Neurologic: Cranial nerves II through XII grossly intact no appreciable focal deficits.  Romberg sign and cerebellar reflexes were.   Psychiatric: Normal judgment and insight.  Patient is awake, alert, oriented.  Has a pleasant mood and affect  is somewhat anxious  Data Reviewed: I have personally reviewed following labs and imaging studies  CBC: Recent Labs  Lab 06/02/18 1615 06/03/18 0341 06/04/18 0318 06/05/18 0359 06/06/18 0432 06/07/18 0431  WBC 3.2* 2.9* 3.0* 3.6* 4.5 4.5  NEUTROABS 1.9  --   --   --   --  3.0  HGB 12.1 10.0* 10.2* 10.5* 10.5* 10.7*  HCT 36.4 31.7* 31.7* 31.4* 31.0* 31.5*  MCV 92.9 96.1 94.3 92.6 91.7 91.0  PLT 150 112* 102* 113* 111* 828*   Basic Metabolic Panel: Recent Labs  Lab 06/02/18 2223 06/03/18 0341 06/04/18 0318 06/05/18 0359 06/06/18 0432 06/07/18 0431  NA  --  140 139 138 136 136  K  --  4.1 4.0 4.2 4.0 3.7  CL  --  113* 111 108 107 106  CO2  --  25 26 26 26 25   GLUCOSE   --  85 92 94 92 90  BUN  --  12 13 11 9 11   CREATININE  --  0.67 0.89 0.84 0.85 0.82  CALCIUM  --  7.6* 7.6* 7.9* 8.0* 8.0*  MG 1.9  --   --   --   --  1.9  PHOS 2.5  --   --   --   --  4.0   GFR: Estimated Creatinine Clearance: 59.1 mL/min (by C-G formula based on SCr of 0.82 mg/dL). Liver Function Tests: Recent Labs  Lab 06/03/18 0341 06/04/18 0318 06/05/18 0359 06/06/18 0432 06/07/18 0431  AST 932* 1,073* 1,203* 1,370* 1,586*  ALT 646* 669* 763* 825* 945*  ALKPHOS 169* 167* 158* 149* 140*  BILITOT 8.8* 10.2* 12.0* 14.2* 15.5*  PROT 6.1* 6.2* 6.5 6.5 6.7  ALBUMIN 2.4* 2.3* 2.2* 2.2* 2.2*   Recent Labs  Lab 06/02/18 1615  LIPASE 37   Recent Labs  Lab 06/02/18 2223  AMMONIA 33   Coagulation Profile: Recent Labs  Lab 06/03/18 0341 06/04/18 0318 06/05/18 0359 06/06/18 0432 06/07/18 0431  INR 1.3* 1.4* 1.3* 1.5* 1.4*   Cardiac Enzymes: No results for input(s): CKTOTAL, CKMB, CKMBINDEX, TROPONINI in the last 168 hours. BNP (last 3 results) No results for input(s): PROBNP in the last 8760 hours. HbA1C: No results for input(s): HGBA1C in the last 72 hours. CBG: No results for input(s): GLUCAP in the last 168 hours. Lipid Profile: No results for input(s): CHOL, HDL, LDLCALC, TRIG, CHOLHDL, LDLDIRECT in the last 72 hours. Thyroid Function Tests: No results for input(s): TSH, T4TOTAL, FREET4, T3FREE, THYROIDAB in the last 72 hours. Anemia Panel: Recent Labs    06/07/18 0431  VITAMINB12 1,288*  FOLATE 33.7  FERRITIN 2,187*  TIBC 146*  IRON 124  RETICCTPCT 2.3   Sepsis Labs: No results for input(s): PROCALCITON, LATICACIDVEN in the last 168 hours.  No results found for this or any previous visit (from the past 240 hour(s)).   Radiology Studies: No results found.   Scheduled Meds: . risperiDONE  0.5 mg Oral QHS  . Tenofovir Alafenamide Fumarate  25 mg Oral Daily   Continuous Infusions:   LOS: 5 days   Kerney Elbe, DO [facility]  PAGER is on AMION  If 7PM-7AM, please contact night-coverage [email or web address] Password [email or web address] 06/07/2018, 10:59 AM

## 2018-06-07 NOTE — Plan of Care (Signed)
  Problem: Education: ?Goal: Knowledge of General Education information will improve ?Description: Including pain rating scale, medication(s)/side effects and non-pharmacologic comfort measures ?Outcome: Progressing ?  ?Problem: Clinical Measurements: ?Goal: Will remain free from infection ?Outcome: Progressing ?  ?Problem: Clinical Measurements: ?Goal: Diagnostic test results will improve ?Outcome: Progressing ?  ?Problem: Clinical Measurements: ?Goal: Respiratory complications will improve ?Outcome: Progressing ?  ?Problem: Clinical Measurements: ?Goal: Cardiovascular complication will be avoided ?Outcome: Progressing ?  ?

## 2018-06-08 ENCOUNTER — Telehealth: Payer: Self-pay | Admitting: Pharmacy Technician

## 2018-06-08 DIAGNOSIS — B169 Acute hepatitis B without delta-agent and without hepatic coma: Principal | ICD-10-CM

## 2018-06-08 LAB — COMPREHENSIVE METABOLIC PANEL
ALT: 991 U/L — ABNORMAL HIGH (ref 0–44)
AST: 1772 U/L — ABNORMAL HIGH (ref 15–41)
Albumin: 2.2 g/dL — ABNORMAL LOW (ref 3.5–5.0)
Alkaline Phosphatase: 136 U/L — ABNORMAL HIGH (ref 38–126)
Anion gap: 3 — ABNORMAL LOW (ref 5–15)
BUN: 10 mg/dL (ref 8–23)
CO2: 25 mmol/L (ref 22–32)
Calcium: 8.1 mg/dL — ABNORMAL LOW (ref 8.9–10.3)
Chloride: 107 mmol/L (ref 98–111)
Creatinine, Ser: 0.76 mg/dL (ref 0.44–1.00)
GFR calc Af Amer: >60 mL/min (ref >60)
GFR calc non Af Amer: >60 mL/min (ref >60)
Glucose, Bld: 91 mg/dL (ref 70–99)
Potassium: 4.1 mmol/L (ref 3.5–5.1)
Sodium: 135 mmol/L (ref 135–145)
Total Bilirubin: 17.2 mg/dL — ABNORMAL HIGH (ref 0.3–1.2)
Total Protein: 7 g/dL (ref 6.5–8.1)

## 2018-06-08 LAB — CBC WITH DIFFERENTIAL/PLATELET
Abs Immature Granulocytes: 0.06 10*3/uL (ref 0.00–0.07)
Basophils Absolute: 0 10*3/uL (ref 0.0–0.1)
Basophils Relative: 1 %
Eosinophils Absolute: 0.1 10*3/uL (ref 0.0–0.5)
Eosinophils Relative: 2 %
HCT: 31.1 % — ABNORMAL LOW (ref 36.0–46.0)
Hemoglobin: 10.3 g/dL — ABNORMAL LOW (ref 12.0–15.0)
Immature Granulocytes: 1 %
Lymphocytes Relative: 16 %
Lymphs Abs: 0.7 10*3/uL (ref 0.7–4.0)
MCH: 29.9 pg (ref 26.0–34.0)
MCHC: 33.1 g/dL (ref 30.0–36.0)
MCV: 90.4 fL (ref 80.0–100.0)
Monocytes Absolute: 0.7 10*3/uL (ref 0.1–1.0)
Monocytes Relative: 16 %
Neutro Abs: 2.8 10*3/uL (ref 1.7–7.7)
Neutrophils Relative %: 64 %
Platelets: 111 10*3/uL — ABNORMAL LOW (ref 150–400)
RBC: 3.44 MIL/uL — ABNORMAL LOW (ref 3.87–5.11)
RDW: 18 % — ABNORMAL HIGH (ref 11.5–15.5)
WBC: 4.4 10*3/uL (ref 4.0–10.5)
nRBC: 0 % (ref 0.0–0.2)

## 2018-06-08 LAB — PROTIME-INR
INR: 1.6 — ABNORMAL HIGH (ref 0.8–1.2)
Prothrombin Time: 18.6 seconds — ABNORMAL HIGH (ref 11.4–15.2)

## 2018-06-08 LAB — MAGNESIUM: Magnesium: 2 mg/dL (ref 1.7–2.4)

## 2018-06-08 LAB — PHOSPHORUS: Phosphorus: 3.9 mg/dL (ref 2.5–4.6)

## 2018-06-08 NOTE — Care Management Important Message (Signed)
Important Message  Patient Details IM Letter given to Kathrin Greathouse SW to present to the Patient Name: Brooke Huber MRN: [id number] Date of Birth: 05-22-1945   Medicare Important Message Given:  Yes    Kerin Salen 06/08/2018, 9:57 AMImportant Message  Patient Details  Name: Brooke Huber MRN: [id number] Date of Birth: 03-19-1945   Medicare Important Message Given:  Yes    Kerin Salen 06/08/2018, 9:56 AM

## 2018-06-08 NOTE — Progress Notes (Signed)
[facility] PROGRESS NOTE Subjective Patient clinically feels better, with less nausea.  Has now received tenofovir alafenamide 25mg  x4 doses.  Objective: Vital signs in last 24 hours: Temp:  [98.1 F (36.7 C)-99 F (37.2 C)] 98.1 F (36.7 C) (05/08 1323) Pulse Rate:  [59-67] 59 (05/08 1323) Resp:  [16-17] 16 (05/08 1323) BP: (106-121)/(55-62) 106/62 (05/08 1323) SpO2:  [93 %-97 %] 97 % (05/08 1323) Last BM Date: 06/07/18  Intake/Output from previous day: 05/07 0701 - 05/08 0700 In: 1180 [P.O.:1180] Out: 2150 [Urine:2150] Intake/Output this shift: Total I/O In: 240 [P.O.:240] Out: 400 [Urine:400]  PE: General--still jaundice   Lab Results: Recent Labs    06/06/18 0432 06/07/18 0431 06/08/18 0409  WBC 4.5 4.5 4.4  HGB 10.5* 10.7* 10.3*  HCT 31.0* 31.5* 31.1*  PLT 111* 126* 111*   BMET Recent Labs    06/06/18 0432 06/07/18 0431 06/08/18 0409  NA 136 136 135  K 4.0 3.7 4.1  CL 107 106 107  CO2 26 25 25   CREATININE 0.85 0.82 0.76   LFT Recent Labs    06/06/18 0432 06/07/18 0431 06/08/18 0409  PROT 6.5 6.7 7.0  AST 1,370* 1,586* 1,772*  ALT 825* 945* 991*  ALKPHOS 149* 140* 136*  BILITOT 14.2* 15.5* 17.2*   PT/INR Recent Labs    06/06/18 0432 06/07/18 0431 06/08/18 0409  LABPROT 17.4* 17.4* 18.6*  INR 1.5* 1.4* 1.6*   PANCREAS No results for input(s): LIPASE in the last 72 hours.       Studies/Results: No results found.  Medications: I have reviewed the patient's current medications.  Assessment:   1.  Acute hepatitis B.  Hepatitis B DNA test reveals 164,000,000 international units/cc and surface antigen is been confirmed to be positive.  I did review her old records and went on tenofovir in the past she had no detectable hepatitis B DNA in her blood.  Hopefully her liver tests will peak and begin to slowly decline.   Plan: Agree with keeping her over the weekend just to make sure that the medication gets delivered to  her home and that she will be able to take it as an outpatient.  I have gone over with her again the fact that she will need to remain on this medication indefinitely. She will follow-up as outpatient with Dr. Linus Salmons. We will be available to see her for any acute problems.  Dr Therisa Doyne will be on-call this weekend please give a call if needed   Nancy Fetter 06/08/2018, 3:32 PM  This note was created using voice recognition software. Minor errors may Have occurred unintentionally.  Pager: [phone] If no answer or after hours call [phone]

## 2018-06-08 NOTE — Progress Notes (Signed)
Subjective:  No new complaints  Antibiotics:  Anti-infectives (From admission, onward)   Start     Dose/Rate Route Frequency Ordered Stop   06/05/18 1600  Tenofovir Alafenamide Fumarate TABS 25 mg     25 mg Oral Daily 06/05/18 0942     06/05/18 1400  Tenofovir Alafenamide Fumarate TABS 25 mg  Status:  Discontinued     25 mg Oral Daily 06/05/18 0941 06/05/18 0941   06/05/18 0000  Tenofovir Alafenamide Fumarate (VEMLIDY) 25 MG TABS     25 mg Oral Daily 06/05/18 1106        Medications: Scheduled Meds: . risperiDONE  0.5 mg Oral QHS  . Tenofovir Alafenamide Fumarate  25 mg Oral Daily   Continuous Infusions: PRN Meds:.ondansetron **OR** ondansetron (ZOFRAN) IV    Objective: Weight change:   Intake/Output Summary (Last 24 hours) at 06/08/2018 1441 Last data filed at 06/08/2018 1323 Gross per 24 hour  Intake 940 ml  Output 1550 ml  Net -610 ml   Blood pressure 106/62, pulse (!) 59, temperature 98.1 F (36.7 C), temperature source Oral, resp. rate 16, height 5\' 2"  (1.575 m), weight 78 kg, SpO2 97 %. Temp:  [98.1 F (36.7 C)-99 F (37.2 C)] 98.1 F (36.7 C) (05/08 1323) Pulse Rate:  [59-67] 59 (05/08 1323) Resp:  [16-17] 16 (05/08 1323) BP: (106-121)/(55-62) 106/62 (05/08 1323) SpO2:  [93 %-97 %] 97 % (05/08 1323)  Physical Exam: General: Alert and awake, oriented x3, not in any acute distress. HEENT: nicteric sclera, EOMI CVS regular rate, normal  Chest: , no wheezing, no respiratory distress Abdomen: soft non-distended,  Extremities: no edema or deformity noted bilaterally Skin: no rashes Neuro: nonfocal  CBC:    BMET Recent Labs    06/07/18 0431 06/08/18 0409  NA 136 135  K 3.7 4.1  CL 106 107  CO2 25 25  GLUCOSE 90 91  BUN 11 10  CREATININE 0.82 0.76  CALCIUM 8.0* 8.1*     Liver Panel  Recent Labs    06/07/18 0431 06/08/18 0409  PROT 6.7 7.0  ALBUMIN 2.2* 2.2*  AST 1,586* 1,772*  ALT 945* 991*  ALKPHOS 140* 136*  BILITOT  15.5* 17.2*       Sedimentation Rate No results for input(s): ESRSEDRATE in the last 72 hours. C-Reactive Protein No results for input(s): CRP in the last 72 hours.  Micro Results: No results found for this or any previous visit (from the past 720 hour(s)).  Studies/Results: No results found.    Assessment/Plan:  INTERVAL HISTORY: LFTS remain uptrending    Principal Problem:   Acute hepatitis Active Problems:   Hypokalemia   Chronic viral hepatitis B without delta-agent (HCC)   Follicular lymphoma grade II of lymph nodes of multiple sites [name])   Depression   Acute liver failure without hepatic coma    Brooke Huber is a 73 y.o. female with reactivation of her chronic hepatitis B after stopping Vemlidy this fall which she was taking as a prophylactic measure in the context of chemotherapy.  1.  Acute hepatitis B flare: --Continue Vemlidy --Grateful to infectious disease pharmacy for being able to secure patient assistance of the patient will not have to incur cost.  Plan is to ship her medications to her house when she discharges.  I would keep her at minimum through Monday to ensure she is getting better and HAS ACCESS to uninterrupted [facility] therapy  Dr. Linus Huber is available for questions  this weekend.   LOS: 6 days   Brooke Huber 06/08/2018, 2:41 PM

## 2018-06-08 NOTE — Progress Notes (Signed)
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  Patient has been feeling bad since 05/23/2018 and reported soft stools occurring every other day with yellow discoloration.  Family notes that her skin and a more yellowish hue.  Recently she has been receiving chemotherapy for follicular lymphoma at that time which was treated with tenderness.  To prevent remission of her hepatitis B however she stopped taking this after she completed her chemotherapy in December 2019.  She presented and has acute on chronic hepatitis B likely in the setting of to benefit discontinuation.  Gastroenterology and Infectious diseases were consulted for further evaluation recommendations and she underwent further work-up.  She is now back on to Tenofovir, however LFTs are still slightly worsening and trending upwards.   Assessment & Plan:   Principal Problem:   Acute hepatitis Active Problems:   Hypokalemia   Chronic viral hepatitis B without delta-agent (HCC)   Follicular lymphoma grade II of lymph nodes of multiple sites [facility])   Depression   Acute liver failure without hepatic coma  Acute Hepatitis and likely re-activation of Chronic Hepatitis B and Hep B Flare  Abnormal LFTs, worsening  Hyperbilirubinemia, worsening -LFTs and T Bili significantly elevated compared to relatively normal labs on 05/16/2018.   -AST is now 1772 (1,586), ALT is 991 (945), and T Bili is 17.2 (15.5); PT is 18.6 and INR is now 1.6 -Abdominal ultrasund showed normal portal  venous flow, CBD diameter 3.4 mm.  Gallbladder was unable to be visualized due to overlying bowel versus decompressed gallbladder.   -Viral hepatitis is suspected.  Patient currently alert and oriented without abdominal pain. -Checked hepatitis A, B, and C studies.  Hepatitis D also a possibility with known hep B -this is a send out study -Hep A Ab, IgM negative, Hep B Surface Ag <3:1, HBsAg Conf Positive, HCV Ab 0.4; Hep B Cort Total Ab is POSITIVE -Checked HIV Ab, and Non-reactive  APAP level was <10; Ammonia Level was 33 -Supportive IV fluids now D/C'd -Continue to Monitor and Trend Hepatic Function as it is still worsening -GI Consulted and recommending continuing Tenofovir 25 mg po Daily and that it will take several days before her LFT's stablize -ID was consulted and recommended restarting Tenofovir and they have procured this for the patient at D/C  -Repeat CMP in AM   Hypokalemia -Improved. K+ was 4.1 this AM  -Continue to monitor and replete as necessary -Repeat CMP in the a.m.  Stage IV Follicular Lymphoma -Follows with oncology Dr. Irene Limbo. Status post chemotherapy of  3 cycles of Bendamustine/Rituxan followed by 2 cycles of only Rituxan (due to neutropenia)(completed 10/2017) with complete metabolic response per last oncology note 05/23/2018.   -CT chest/abdomen/pelvis 05/17/2018 showed no residual or recurrent lymphadenopathy.  MDD with psychosis/PTSD/Panic disorder: -Follows with behavioral health, currently stable on home regimen of Zoloft 50 mg daily and Risperdal 0.5 mg nightly. -Continue Risperidone 0.5 mg po qHS -Hold Zoloft for now given liver dysfunction  Ectatic Abdominal Aorta -Seen on abdominal ultrasound, abdominal aorta measures 2.8 cm.   -Follow-up aortic ultrasound in 5  years recommended as an outpatient  Obesity -Estimated body mass index is 31.46 kg/m as calculated from the following:   Height as of this encounter: 5\' 2"  (1.575 m).   Weight as of this  encounter: 78 kg. -Weight Loss and Dietary Counseling given   Pancytopenia, improving  -In the Setting of Chronic Disease and Follicular Lymphoma -WBC went from 2.9 at its lowest and is now 4.4 -Hb/Hct is stable at 10.3/31.1 -Platelet Count went from 102 -> 111 -> 126 -> 111 -S/p Chemotherapy  -Checked Anemia Panel and showed iron level of 124, U IBC of 22, TIBC 146, saturation ratios of 85%, ferritin level 2187, folate level of 33.7, vitamin B12 level 1288 -Continue to Monitor for S/Sx of Bleeding -Repeat CBC in AM   DVT prophylaxis: SCDs Code Status: FULL CODE  Family Communication: No family present at bedside  Disposition Plan: D/C Home when Hepatic Function starts to improve  Consultants:   [facility]  Infectious Diseases   Procedures: None  Antimicrobials:  Anti-infectives (From admission, onward)   Start     Dose/Rate Route Frequency Ordered Stop   06/05/18 1600  Tenofovir Alafenamide Fumarate TABS 25 mg     25 mg Oral Daily 06/05/18 0942     06/05/18 1400  Tenofovir Alafenamide Fumarate TABS 25 mg  Status:  Discontinued     25 mg Oral Daily 06/05/18 0941 06/05/18 0941   06/05/18 0000  Tenofovir Alafenamide Fumarate (VEMLIDY) 25 MG TABS     25 mg Oral Daily 06/05/18 1106       Subjective: Seen and examined at bedside and states that she was not nauseous last night it is morning.  Denies any chest pain, lightheadedness or dizziness.  No nausea or vomiting.  Still concerned that her liver functions were going up still.  No other concerns or complaints at this time.  Objective: Vitals:   06/07/18 0437 06/07/18 1329 06/07/18 2134 06/08/18 0525  BP: (!) 109/49 (!) 103/54 (!) 121/58 (!) 106/55  Pulse: (!) 57 69 67 66  Resp: 18 14 17 16   Temp: 98.2 F (36.8 C) 98.1 F (36.7 C) 99 F (37.2 C) 98.6 F (37 C)  TempSrc: Oral Oral Oral Oral  SpO2: 98% 99% 97% 93%  Weight:      Height:        Intake/Output Summary (Last 24 hours) at 06/08/2018 1103 Last  data filed at 06/08/2018 0845 Gross per 24 hour  Intake 1060 ml  Output 2150 ml  Net -1090 ml   Filed Weights   06/02/18 1533  Weight: 78 kg   Examination: Physical Exam:  Constitutional: Well-nourished, well-developed obese Caucasian female who is laying in bed and states that she is feeling okay today. Eyes: Sclera is icteric ENMT: External ears nose appear normal.  Mucous membranes are moist Neck: Appears supple no JVD Respiratory: Clear to auscultation bilaterally no appreciable wheezing, rales, rhonchi.  Patient not tachypneic using accessory muscles to breathe Cardiovascular: Regular rate and rhythm.  No appreciable murmurs, rubs, gallops.  Has mild lower extremity edema Abdomen: Soft, nontender, distended slightly.  Bowel sounds present GU: Deferred Musculoskeletal: No contractures or cyanosis.  No joint deformities upper and lower extremities Skin: Has a jaundiced hue but no appreciable rashes or lesions on to skin evaluation Neurologic: Cranial nerves II through XII grossly intact no appreciable focal deficits.  Romberg sign cerebellar reflexes were not assessed. Psychiatric: Normal judgment and insight.  Patient is awake, alert, oriented.  Has a pleasant mood  and affect and not as anxious today  Data Reviewed: I have personally reviewed following labs and imaging studies  CBC: Recent Labs  Lab 06/02/18 1615  06/04/18 0318 06/05/18 0359 06/06/18 0432 06/07/18 0431 06/08/18 0409  WBC 3.2*   < > 3.0* 3.6* 4.5 4.5 4.4  NEUTROABS 1.9  --   --   --   --  3.0 2.8  HGB 12.1   < > 10.2* 10.5* 10.5* 10.7* 10.3*  HCT 36.4   < > 31.7* 31.4* 31.0* 31.5* 31.1*  MCV 92.9   < > 94.3 92.6 91.7 91.0 90.4  PLT 150   < > 102* 113* 111* 126* 111*   < > = values in this interval not displayed.   Basic Metabolic Panel: Recent Labs  Lab 06/02/18 2223  06/04/18 0318 06/05/18 0359 06/06/18 0432 06/07/18 0431 06/08/18 0409  NA  --    < > 139 138 136 136 135  K  --    < > 4.0 4.2  4.0 3.7 4.1  CL  --    < > 111 108 107 106 107  CO2  --    < > 26 26 26 25 25   GLUCOSE  --    < > 92 94 92 90 91  BUN  --    < > 13 11 9 11 10   CREATININE  --    < > 0.89 0.84 0.85 0.82 0.76  CALCIUM  --    < > 7.6* 7.9* 8.0* 8.0* 8.1*  MG 1.9  --   --   --   --  1.9 2.0  PHOS 2.5  --   --   --   --  4.0 3.9   < > = values in this interval not displayed.   GFR: Estimated Creatinine Clearance: 60.6 mL/min (by C-G formula based on SCr of 0.76 mg/dL). Liver Function Tests: Recent Labs  Lab 06/04/18 0318 06/05/18 0359 06/06/18 0432 06/07/18 0431 06/08/18 0409  AST 1,073* 1,203* 1,370* 1,586* 1,772*  ALT 669* 763* 825* 945* 991*  ALKPHOS 167* 158* 149* 140* 136*  BILITOT 10.2* 12.0* 14.2* 15.5* 17.2*  PROT 6.2* 6.5 6.5 6.7 7.0  ALBUMIN 2.3* 2.2* 2.2* 2.2* 2.2*   Recent Labs  Lab 06/02/18 1615  LIPASE 37   Recent Labs  Lab 06/02/18 2223  AMMONIA 33   Coagulation Profile: Recent Labs  Lab 06/04/18 0318 06/05/18 0359 06/06/18 0432 06/07/18 0431 06/08/18 0409  INR 1.4* 1.3* 1.5* 1.4* 1.6*   Cardiac Enzymes: No results for input(s): CKTOTAL, CKMB, CKMBINDEX, TROPONINI in the last 168 hours. BNP (last 3 results) No results for input(s): PROBNP in the last 8760 hours. HbA1C: No results for input(s): HGBA1C in the last 72 hours. CBG: No results for input(s): GLUCAP in the last 168 hours. Lipid Profile: No results for input(s): CHOL, HDL, LDLCALC, TRIG, CHOLHDL, LDLDIRECT in the last 72 hours. Thyroid Function Tests: No results for input(s): TSH, T4TOTAL, FREET4, T3FREE, THYROIDAB in the last 72 hours. Anemia Panel: Recent Labs    06/07/18 0431  VITAMINB12 1,288*  FOLATE 33.7  FERRITIN 2,187*  TIBC 146*  IRON 124  RETICCTPCT 2.3   Sepsis Labs: No results for input(s): PROCALCITON, LATICACIDVEN in the last 168 hours.  No results found for this or any previous visit (from the past 240 hour(s)).   Radiology Studies: No results found.  Scheduled Meds: .  risperiDONE  0.5 mg Oral QHS  . Tenofovir Alafenamide Fumarate  25 mg Oral  Daily   Continuous Infusions:   LOS: 6 days   Kerney Elbe, DO [facility] PAGER is on AMION  If 7PM-7AM, please contact night-coverage [email or web address] Password [email or web address] 06/08/2018, 11:03 AM

## 2018-06-08 NOTE — Plan of Care (Signed)

## 2018-06-08 NOTE — Telephone Encounter (Addendum)
Will ship [place] from [place] to Ms. Everson's home once she is discharged from [facility].  Approved for copay assistance through [employer] through 07/09/2019 $4000.  ID [id number] BIN [id number] PCN [id number] GRP [id number]  [facility] for [facility]ious [facility] Phone: [phone] Fax:  [phone]

## 2018-06-09 LAB — CBC WITH DIFFERENTIAL/PLATELET
Abs Immature Granulocytes: 0.06 10*3/uL (ref 0.00–0.07)
Basophils Absolute: 0 10*3/uL (ref 0.0–0.1)
Basophils Relative: 1 %
Eosinophils Absolute: 0.1 10*3/uL (ref 0.0–0.5)
Eosinophils Relative: 2 %
HCT: 31 % — ABNORMAL LOW (ref 36.0–46.0)
Hemoglobin: 10.6 g/dL — ABNORMAL LOW (ref 12.0–15.0)
Immature Granulocytes: 1 %
Lymphocytes Relative: 15 %
Lymphs Abs: 0.8 10*3/uL (ref 0.7–4.0)
MCH: 30.8 pg (ref 26.0–34.0)
MCHC: 34.2 g/dL (ref 30.0–36.0)
MCV: 90.1 fL (ref 80.0–100.0)
Monocytes Absolute: 1 10*3/uL (ref 0.1–1.0)
Monocytes Relative: 19 %
Neutro Abs: 3.3 10*3/uL (ref 1.7–7.7)
Neutrophils Relative %: 62 %
Platelets: 150 10*3/uL (ref 150–400)
RBC: 3.44 MIL/uL — ABNORMAL LOW (ref 3.87–5.11)
RDW: 19 % — ABNORMAL HIGH (ref 11.5–15.5)
WBC: 5.2 10*3/uL (ref 4.0–10.5)
nRBC: 0 % (ref 0.0–0.2)

## 2018-06-09 LAB — PROTIME-INR
INR: 1.5 — ABNORMAL HIGH (ref 0.8–1.2)
Prothrombin Time: 17.8 seconds — ABNORMAL HIGH (ref 11.4–15.2)

## 2018-06-09 LAB — COMPREHENSIVE METABOLIC PANEL
ALT: 1108 U/L — ABNORMAL HIGH (ref 0–44)
AST: 1990 U/L — ABNORMAL HIGH (ref 15–41)
Albumin: 2.1 g/dL — ABNORMAL LOW (ref 3.5–5.0)
Alkaline Phosphatase: 126 U/L (ref 38–126)
Anion gap: 8 (ref 5–15)
BUN: 10 mg/dL (ref 8–23)
CO2: 25 mmol/L (ref 22–32)
Calcium: 8.2 mg/dL — ABNORMAL LOW (ref 8.9–10.3)
Chloride: 102 mmol/L (ref 98–111)
Creatinine, Ser: 0.49 mg/dL (ref 0.44–1.00)
GFR calc Af Amer: >60 mL/min (ref >60)
GFR calc non Af Amer: >60 mL/min (ref >60)
Glucose, Bld: 93 mg/dL (ref 70–99)
Potassium: 4.8 mmol/L (ref 3.5–5.1)
Sodium: 135 mmol/L (ref 135–145)
Total Bilirubin: 17.2 mg/dL — ABNORMAL HIGH (ref 0.3–1.2)
Total Protein: 6.9 g/dL (ref 6.5–8.1)

## 2018-06-09 LAB — MAGNESIUM: Magnesium: 2 mg/dL (ref 1.7–2.4)

## 2018-06-09 LAB — PHOSPHORUS: Phosphorus: 3.8 mg/dL (ref 2.5–4.6)

## 2018-06-09 MED ORDER — SENNOSIDES-DOCUSATE SODIUM 8.6-50 MG PO TABS
1.0000 | ORAL_TABLET | Freq: Two times a day (BID) | ORAL | Status: DC
  Administered 2018-06-09 – 2018-06-12 (×5): 1 via ORAL
  Filled 2018-06-09 (×6): qty 1

## 2018-06-09 MED ORDER — POLYETHYLENE GLYCOL 3350 17 G PO PACK
17.0000 g | PACK | Freq: Two times a day (BID) | ORAL | Status: DC
  Administered 2018-06-09 – 2018-06-12 (×5): 17 g via ORAL
  Filled 2018-06-09 (×6): qty 1

## 2018-06-09 NOTE — Progress Notes (Signed)
Per Dr Brooke Huber leave PIV out for now due to poor venous access,

## 2018-06-09 NOTE — Progress Notes (Addendum)
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  Patient has been feeling bad since 05/23/2018 and reported soft stools occurring every other day with yellow discoloration.  Family notes that her skin and a more yellowish hue.  Recently she has been receiving chemotherapy for follicular lymphoma at that time which was treated with tenderness.  To prevent remission of her hepatitis B however she stopped taking this after she completed her chemotherapy in December 2019.  She presented and has acute on chronic hepatitis B likely in the setting of to benefit discontinuation.  Gastroenterology and Infectious diseases were consulted for further evaluation recommendations and she underwent further work-up.  She is now back on to Tenofovir, however LFTs are still worsening and trending upwards. T Bili appears to be the same as yesterday.  Infectious diseases is recommending keeping her at a minimum through Monday to ensure she gets better and has access to her uninterrupted Tenofovir Treatment.   Assessment & Plan:   Principal Problem:   Acute hepatitis Active Problems:   Hypokalemia   Chronic viral hepatitis B without delta-agent (HCC)   Follicular lymphoma grade II of lymph nodes of multiple sites [facility])   Depression   Acute liver failure without hepatic coma  Acute Hepatitis and likely re-activation of Chronic Hepatitis B and Hep B Flare  Abnormal LFTs, worsening  Hyperbilirubinemia, worsening -LFTs and T Bili significantly elevated  compared to relatively normal labs on 05/16/2018.   -AST is now 1,990 (1,772), ALT is 1,108 (991), and T Bili is 17.2 and the same as yesterday; PT is 17.8 and INR is now 1.5 -Abdominal ultrasund showed normal portal venous flow, CBD diameter 3.4 mm.  Gallbladder was unable to be visualized due to overlying bowel versus decompressed gallbladder.   -Viral hepatitis is suspected.  Patient currently alert and oriented without abdominal pain. -Checked hepatitis A, B, and C studies.  Hepatitis D also a possibility with known hep B -this is a send out study -Hep A Ab, IgM negative, Hep B Surface Ag <3:1, HBsAg Conf Positive, HCV Ab 0.4; Hep B Cort Total Ab is POSITIVE; Hep B DNA test reveals 164,000,000 international units/cc -Checked HIV Ab, and Non-reactive  APAP level was <10; Ammonia Level was 33 -Supportive IV fluids now D/C'd -Continue to Monitor and Trend Hepatic Function as it is still worsening; T Bili te same as yesterday  -GI Consulted and recommending continuing Tenofovir 25 mg po Daily and that it will take several days before her LFT's stablize -ID was consulted and recommended restarting Tenofovir and they have procured this for the patient at D/C and Dr. Drucilla Schmidt has recommended keeping to at least Monday to ensure that she gets uninterrupted treatment and to make sure that she stabilizes improved -Repeat CMP in AM   Hypokalemia -Improved. K+ was 4.8 this AM  -Continue to monitor and replete as necessary -Repeat CMP in the a.m.  Stage IV Follicular Lymphoma -Follows with oncology Dr. Irene Limbo. Status post chemotherapy of  3 cycles of Bendamustine/Rituxan followed by 2 cycles of only Rituxan (due to neutropenia)(completed 10/2017) with  complete metabolic response per last oncology note 05/23/2018.   -CT chest/abdomen/pelvis 05/17/2018 showed no residual or recurrent lymphadenopathy.  MDD with psychosis/PTSD/Panic disorder: -Follows with behavioral health, currently stable on home regimen of  Zoloft 50 mg daily and Risperdal 0.5 mg nightly. -Continue Risperidone 0.5 mg po qHS -Hold Zoloft for now given liver dysfunction  Ectatic Abdominal Aorta -Seen on abdominal ultrasound, abdominal aorta measures 2.8 cm.   -Follow-up aortic ultrasound in 5 years recommended as an outpatient  Obesity -Estimated body mass index is 31.46 kg/m as calculated from the following:   Height as of this encounter: 5\' 2"  (1.575 m).   Weight as of this encounter: 78 kg. -Weight Loss and Dietary Counseling given   Pancytopenia, improving  -In the Setting of Chronic Disease and Follicular Lymphoma -WBC went from 2.9 at its lowest and is now 5.2 -Hb/Hct is stable at 10.6/31.0 -Platelet Count went from 102 -> 111 -> 126 -> 111 -> 150 -S/p Chemotherapy  -Checked Anemia Panel and showed iron level of 124, U IBC of 22, TIBC 146, saturation ratios of 85%, ferritin level 2187, folate level of 33.7, vitamin B12 level 1288 -Continue to Monitor for S/Sx of Bleeding -Repeat CBC in AM   DVT prophylaxis: SCDs Code Status: FULL CODE  Family Communication: No family present at bedside  Disposition Plan: D/C Home when Hepatic Function starts to improve and stabilize   Consultants:   [facility]  Infectious Diseases   Procedures: None  Antimicrobials:  Anti-infectives (From admission, onward)   Start     Dose/Rate Route Frequency Ordered Stop   06/05/18 1600  Tenofovir Alafenamide Fumarate TABS 25 mg     25 mg Oral Daily 06/05/18 0942     06/05/18 1400  Tenofovir Alafenamide Fumarate TABS 25 mg  Status:  Discontinued     25 mg Oral Daily 06/05/18 0941 06/05/18 0941   06/05/18 0000  Tenofovir Alafenamide Fumarate (VEMLIDY) 25 MG TABS     25 mg Oral Daily 06/05/18 1106       Subjective: Seen and examined at bedside and denies any nausea, vomiting, headedness or dizziness.  Denies any abdominal pain.  I discussed with her that her LFTs are still elevating but her T bili was about the same as  yesterday.  She is asking if Dr. Oletta Lamas will be coming by today.  No other concerns complaints at this time.  Objective: Vitals:   06/08/18 0525 06/08/18 1323 06/08/18 2051 06/09/18 0543  BP: (!) 106/55 106/62 121/62 (!) 110/55  Pulse: 66 (!) 59 68 68  Resp: 16 16 17 16   Temp: 98.6 F (37 C) 98.1 F (36.7 C) 99.7 F (37.6 C) 98.3 F (36.8 C)  TempSrc: Oral Oral Oral Oral  SpO2: 93% 97% 97% 94%  Weight:      Height:        Intake/Output Summary (Last 24 hours) at 06/09/2018 1059 Last data filed at 06/09/2018 1001 Gross per 24 hour  Intake 600 ml  Output 1900 ml  Net -1300 ml   Filed Weights   06/02/18 1533  Weight: 78 kg   Examination: Physical Exam:  Constitutional: Well-nourished, well-developed obese Caucasian female currently no acute distress and denies any complaints today Eyes: Sclera is icteric ENMT: External ears and nose appear normal.  Mucous members are moist Neck: Appears supple no JVD Respiratory: Clear to auscultation bilaterally and appreciable wheezing, rales or rhonchi.  Patient not tachypneic using accessory muscles to breathe Cardiovascular: Regular rate and rhythm.  No appreciable murmurs, rubs or gallops.  Has trace of some edema Abdomen: Soft, nontender, distended slightly secondary body habitus.  Bowel sounds present GU: Deferred Musculoskeletal: No contractures or cyanosis.  No joint deformities in the upper lower extremities Skin: Skin has a jaundiced hue but no appreciable rashes or lesions on to skin evaluation Neurologic: Cranial nerves II through XII grossly intact no appreciable focal deficits.  Romberg sign is cerebellar reflexes not assessed Psychiatric: Intact judgment insight.  Patient is not as anxious today.  Data Reviewed: I have personally reviewed following labs and imaging studies  CBC: Recent Labs  Lab 06/02/18 1615  06/05/18 0359 06/06/18 0432 06/07/18 0431 06/08/18 0409 06/09/18 0347  WBC 3.2*   < > 3.6* 4.5 4.5 4.4 5.2   NEUTROABS 1.9  --   --   --  3.0 2.8 3.3  HGB 12.1   < > 10.5* 10.5* 10.7* 10.3* 10.6*  HCT 36.4   < > 31.4* 31.0* 31.5* 31.1* 31.0*  MCV 92.9   < > 92.6 91.7 91.0 90.4 90.1  PLT 150   < > 113* 111* 126* 111* 150   < > = values in this interval not displayed.   Basic Metabolic Panel: Recent Labs  Lab 06/02/18 2223  06/05/18 0359 06/06/18 0432 06/07/18 0431 06/08/18 0409 06/09/18 0347  NA  --    < > 138 136 136 135 135  K  --    < > 4.2 4.0 3.7 4.1 4.8  CL  --    < > 108 107 106 107 102  CO2  --    < > 26 26 25 25 25   GLUCOSE  --    < > 94 92 90 91 93  BUN  --    < > 11 9 11 10 10   CREATININE  --    < > 0.84 0.85 0.82 0.76 0.49  CALCIUM  --    < > 7.9* 8.0* 8.0* 8.1* 8.2*  MG 1.9  --   --   --  1.9 2.0 2.0  PHOS 2.5  --   --   --  4.0 3.9 3.8   < > = values in this interval not displayed.   GFR: Estimated Creatinine Clearance: 60.6 mL/min (by C-G formula based on SCr of 0.49 mg/dL). Liver Function Tests: Recent Labs  Lab 06/05/18 0359 06/06/18 0432 06/07/18 0431 06/08/18 0409 06/09/18 0347  AST 1,203* 1,370* 1,586* 1,772* 1,990*  ALT 763* 825* 945* 991* 1,108*  ALKPHOS 158* 149* 140* 136* 126  BILITOT 12.0* 14.2* 15.5* 17.2* 17.2*  PROT 6.5 6.5 6.7 7.0 6.9  ALBUMIN 2.2* 2.2* 2.2* 2.2* 2.1*   Recent Labs  Lab 06/02/18 1615  LIPASE 37   Recent Labs  Lab 06/02/18 2223  AMMONIA 33   Coagulation Profile: Recent Labs  Lab 06/05/18 0359 06/06/18 0432 06/07/18 0431 06/08/18 0409 06/09/18 0347  INR 1.3* 1.5* 1.4* 1.6* 1.5*   Cardiac Enzymes: No results for input(s): CKTOTAL, CKMB, CKMBINDEX, TROPONINI in the last 168 hours. BNP (last 3 results) No results for input(s): PROBNP in the last 8760 hours. HbA1C: No results for input(s): HGBA1C in the last 72 hours. CBG: No results for input(s): GLUCAP in the last 168 hours. Lipid Profile: No results for input(s): CHOL, HDL, LDLCALC, TRIG, CHOLHDL, LDLDIRECT in the last 72 hours. Thyroid Function Tests: No  results for input(s): TSH, T4TOTAL, FREET4, T3FREE, THYROIDAB in the last 72 hours. Anemia Panel: Recent Labs    06/07/18 0431  VITAMINB12 1,288*  FOLATE 33.7  FERRITIN 2,187*  TIBC 146*  IRON 124  RETICCTPCT 2.3   Sepsis Labs: No results for input(s): PROCALCITON, LATICACIDVEN in the last 168 hours.  No results found for this or any previous visit (from the past 240 hour(s)).   Radiology Studies: No results found.  Scheduled Meds: . risperiDONE  0.5 mg Oral QHS  . Tenofovir Alafenamide Fumarate  25 mg Oral Daily   Continuous Infusions:   LOS: 7 days   Kerney Elbe, DO [facility] PAGER is on AMION  If 7PM-7AM, please contact night-coverage [email or web address] Password [email or web address] 06/09/2018, 10:59 AM

## 2018-06-10 LAB — CBC WITH DIFFERENTIAL/PLATELET
Abs Immature Granulocytes: 0.08 10*3/uL — ABNORMAL HIGH (ref 0.00–0.07)
Basophils Absolute: 0 10*3/uL (ref 0.0–0.1)
Basophils Relative: 1 %
Eosinophils Absolute: 0.1 10*3/uL (ref 0.0–0.5)
Eosinophils Relative: 2 %
HCT: 30 % — ABNORMAL LOW (ref 36.0–46.0)
Hemoglobin: 10.2 g/dL — ABNORMAL LOW (ref 12.0–15.0)
Immature Granulocytes: 1 %
Lymphocytes Relative: 16 %
Lymphs Abs: 0.9 10*3/uL (ref 0.7–4.0)
MCH: 30.8 pg (ref 26.0–34.0)
MCHC: 34 g/dL (ref 30.0–36.0)
MCV: 90.6 fL (ref 80.0–100.0)
Monocytes Absolute: 0.9 10*3/uL (ref 0.1–1.0)
Monocytes Relative: 16 %
Neutro Abs: 3.8 10*3/uL (ref 1.7–7.7)
Neutrophils Relative %: 64 %
Platelets: 128 10*3/uL — ABNORMAL LOW (ref 150–400)
RBC: 3.31 MIL/uL — ABNORMAL LOW (ref 3.87–5.11)
RDW: 19.4 % — ABNORMAL HIGH (ref 11.5–15.5)
WBC: 5.9 10*3/uL (ref 4.0–10.5)
nRBC: 0 % (ref 0.0–0.2)

## 2018-06-10 LAB — COMPREHENSIVE METABOLIC PANEL
ALT: 1060 U/L — ABNORMAL HIGH (ref 0–44)
AST: 1867 U/L — ABNORMAL HIGH (ref 15–41)
Albumin: 2.2 g/dL — ABNORMAL LOW (ref 3.5–5.0)
Alkaline Phosphatase: 116 U/L (ref 38–126)
Anion gap: 6 (ref 5–15)
BUN: 10 mg/dL (ref 8–23)
CO2: 26 mmol/L (ref 22–32)
Calcium: 8.1 mg/dL — ABNORMAL LOW (ref 8.9–10.3)
Chloride: 104 mmol/L (ref 98–111)
Creatinine, Ser: 0.8 mg/dL (ref 0.44–1.00)
GFR calc Af Amer: >60 mL/min (ref >60)
GFR calc non Af Amer: >60 mL/min (ref >60)
Glucose, Bld: 94 mg/dL (ref 70–99)
Potassium: 4.3 mmol/L (ref 3.5–5.1)
Sodium: 136 mmol/L (ref 135–145)
Total Bilirubin: 20 mg/dL (ref 0.3–1.2)
Total Protein: 7.2 g/dL (ref 6.5–8.1)

## 2018-06-10 LAB — PHOSPHORUS: Phosphorus: 4.1 mg/dL (ref 2.5–4.6)

## 2018-06-10 LAB — MAGNESIUM: Magnesium: 2.2 mg/dL (ref 1.7–2.4)

## 2018-06-10 LAB — PROTIME-INR
INR: 1.5 — ABNORMAL HIGH (ref 0.8–1.2)
Prothrombin Time: 18 seconds — ABNORMAL HIGH (ref 11.4–15.2)

## 2018-06-10 MED ORDER — ENSURE ENLIVE PO LIQD
237.0000 mL | Freq: Two times a day (BID) | ORAL | Status: DC
  Administered 2018-06-10 – 2018-06-12 (×2): 237 mL via ORAL

## 2018-06-10 MED ORDER — ADULT MULTIVITAMIN W/MINERALS CH
1.0000 | ORAL_TABLET | Freq: Every day | ORAL | Status: DC
  Administered 2018-06-10 – 2018-06-12 (×3): 1 via ORAL
  Filled 2018-06-10 (×4): qty 1

## 2018-06-10 NOTE — Progress Notes (Signed)
Initial Nutrition Assessment  RD working remotely.   DOCUMENTATION CODES:   Not applicable  INTERVENTION:  - will order Ensure Enlive BID, each supplement provides 350 kcal and 20 grams of protein. - continue to encourage PO intakes.  - weigh patient today.    NUTRITION DIAGNOSIS:   Increased nutrient needs related to chronic illness, cancer and cancer related treatments as evidenced by estimated needs.  GOAL:   Patient will meet greater than or equal to 90% of their needs  MONITOR:   PO intake, Supplement acceptance, Labs, Weight trends  REASON FOR ASSESSMENT:   Malnutrition Screening Tool  ASSESSMENT:   73 year old Caucasian female with a past medical history significant for stage IV follicular lymphoma s/p chemotherapy, chronic hepatitis B, major depressive disorder with psychosis, PTSD, panic disorder, and other comorbidities. She presented to the ED from PCP with chief complaint of nausea, lethargy, and abnormal labs specifically abnormal LFTs. Patient has been feeling bad since 4/22 and reported soft stools occurring every other day with yellow discoloration. Family noted that her skin had a more yellowish hue. Gastroenterology and ID were consulted for further evaluation. ID is recommending keeping her at a minimum through Monday (5/11) to ensure she gets better and has access to Tenofovir treatment uninterrupted.  BMI on 5/2 ws 31.45 kg/m2 and weight on that date was 172 lb. Patient has not been weighed since that time. Weight on 4/22 was also 172 lb and weight on 1/11 was 167 lb. Weight has been mainly stable over the past 11 months.   Per RN flow sheet, patient consumed 100% of breakfast and 25% of lunch and dinner on 5/8; 25% of breakfast and lunch and 90% of dinner on 5/9; 50% of breakfast this AM. Patient reports typically having a good appetite with no chewing or swallowing issues. Over the past few days appetite has been decreased d/t current medical  condition/course.    Per notes: acute on chronic hepatitis flare, elevated LFTs beginning to improve and trend down, hyperbilirubinemia is worsening, hypokalemia--currently resolved, stage 4 follicular lymphoma s/p chemo, ectatic abdominal aorta with plan to have follow-up ultrasound in 5 years, pancytopenia--improving, constipation with bowel regimen ordered. Discharge could be as soon as tomorrow.   Medications reviewed; daily multivitamin with minerals, 1 packet miralax BID, 1 tablet senokot BID, 25 mg tenofovir/day.  Labs reviewed; Ca: 8.1 mg/dl, LFTs remain very elevated.      NUTRITION - FOCUSED PHYSICAL EXAM:  unable to perform at this time.  Diet Order:   Diet Order            Diet regular Room service appropriate? Yes; Fluid consistency: Thin  Diet effective now              EDUCATION NEEDS:   No education needs have been identified at this time  Skin:  Skin Assessment: Reviewed RN Assessment  Last BM:  5/9  Height:   Ht Readings from Last 1 Encounters:  06/02/18 5\' 2"  (1.575 m)    Weight:   Wt Readings from Last 1 Encounters:  06/02/18 78 kg    Ideal Body Weight:  50 kg  BMI:  Body mass index is 31.46 kg/m.  Estimated Nutritional Needs:   Kcal:  1610-9604 kcal  Protein:  110-120 grams  Fluid:  >/= 2 L/day     Jarome Matin, MS, RD, LDN, [facility] Inpatient Clinical Dietitian Pager # [phone] After hours/weekend pager # [phone]

## 2018-06-10 NOTE — Progress Notes (Signed)
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  Patient has been feeling bad since 05/23/2018 and reported soft stools occurring every other day with yellow discoloration.  Family notes that her skin and a more yellowish hue.  Recently she has been receiving chemotherapy for follicular lymphoma at that time which was treated with tenderness.  To prevent remission of her hepatitis B however she stopped taking this after she completed her chemotherapy in December 2019.  She presented and has acute on chronic hepatitis B likely in the setting of to benefit discontinuation.  Gastroenterology and Infectious diseases were consulted for further evaluation recommendations and she underwent further work-up.  She is now back on to Tenofovir, however LFTs are were worsening and now slowly trending down. T Bili appears to be still trending up.  Infectious diseases is recommending keeping her at a minimum through Monday to ensure she gets better and has access to her uninterrupted Tenofovir Treatment.  Cussed the case with Dr. Therisa Doyne of gastroenterology now that her LFTs are trending down she recommends likely discharge tomorrow if they are further trending down and if she has no symptoms and not confused.  I discussed with her about her uptrending bilirubin and Dr. Therisa Doyne states that this can happen 4 weeks and recommend follow-up with Dr. Oletta Lamas in the outpatient setting.  Anticipate discharging in the next 24 to 48 hours if LFTs are further improved.   Assessment & Plan:   Principal Problem:   Acute hepatitis Active Problems:   Hypokalemia   Chronic viral hepatitis B without delta-agent (HCC)   Follicular lymphoma grade II of lymph nodes of multiple sites [facility])   Depression   Acute liver failure without hepatic coma  Acute Hepatitis and likely re-activation of Chronic Hepatitis B and Hep B Flare  Abnormal LFTs, improving and starting to trend down  Hyperbilirubinemia, worsening -LFTs and T Bili significantly elevated compared to relatively normal labs on 05/16/2018.   -AST is now 1,867 (1990), ALT is 1060 (1108), and T Bili is 20.0 (17.2); PT is 18.0 and INR is now 1.5 -Abdominal ultrasund showed normal portal venous flow, CBD diameter 3.4 mm.  Gallbladder was unable to be visualized due to overlying bowel versus decompressed gallbladder.   -Viral hepatitis is suspected.  Patient currently alert and oriented without abdominal pain. -Checked hepatitis A, B, and C studies.  Hepatitis D also a possibility with known hep B -this is a send out study -Hep A Ab, IgM negative, Hep B Surface Ag <3:1, HBsAg Conf Positive, HCV Ab 0.4; Hep B Cort Total Ab is POSITIVE; Hep B DNA test reveals 164,000,000 international units/cc -Checked HIV Ab, and Non-reactive  APAP level was <10; Ammonia Level was 33 -Supportive IV fluids now D/C'd -Continue to Monitor and Trend Hepatic Function as it is now improving; T Bili is the same as yesterday  -GI Consulted and recommending continuing Tenofovir 25 mg po Daily and that it will take several days before her LFT's stablize -ID was consulted and recommended restarting Tenofovir and they have procured this for the patient at D/C  and Dr. Drucilla Schmidt has recommended keeping to at least Monday to ensure that she gets uninterrupted treatment and to make sure that she stabilizes improved -Repeat CMP in AM   Hypokalemia -Improved. K+ was 4.3 this AM  -Continue to monitor and replete as necessary -Repeat CMP in the a.m.   Stage IV Follicular Lymphoma -Follows with oncology Dr. Irene Limbo. Status post chemotherapy of  3 cycles of Bendamustine/Rituxan followed by 2 cycles of only Rituxan (due to neutropenia)(completed 10/2017) with complete metabolic response per last oncology note 05/23/2018.   -CT chest/abdomen/pelvis 05/17/2018 showed no residual or recurrent lymphadenopathy.  MDD with psychosis/PTSD/Panic disorder: -Follows with behavioral health, currently stable on home regimen of Zoloft 50 mg daily and Risperdal 0.5 mg nightly. -Continue Risperidone 0.5 mg po qHS -Hold Zoloft for now given liver dysfunction  Ectatic Abdominal Aorta -Seen on abdominal ultrasound, abdominal aorta measures 2.8 cm.   -Follow-up aortic ultrasound in 5 years recommended as an outpatient  Obesity -Estimated body mass index is 31.46 kg/m as calculated from the following:   Height as of this encounter: 5\' 2"  (1.575 m).   Weight as of this encounter: 78 kg. -Weight Loss and Dietary Counseling given   Pancytopenia, improving  -In the Setting of Chronic Disease and Follicular Lymphoma -WBC went from 2.9 at its lowest and is now 5.9 -Hb/Hct is stable at 10.2/30.0 -Platelet Count went from 102 -> 111 -> 126 -> 111 -> 150 -> 128 -S/p Chemotherapy  -Checked Anemia Panel and showed iron level of 124, U IBC of 22, TIBC 146, saturation ratios of 85%, ferritin level 2187, folate level of 33.7, vitamin B12 level 1288 -Continue to Monitor for S/Sx of Bleeding -Repeat CBC in AM   Constipation -Patient had some constipation yesterday -Started patient on MiraLAX 17 g p.o. twice daily along with senna docusate 1 tab p.o. twice daily with improvement -Continue monitor  DVT prophylaxis: SCDs Code Status: FULL CODE  Family Communication: No family present at bedside  Disposition Plan: D/C Home when Hepatic Function starts to improve and this will likely be tomorrow given that her LFTs are now trending down and that she will have her Tenofovir  delivered to her home tomorrow.   Consultants:   [facility]  Infectious Diseases   Procedures: None  Antimicrobials:  Anti-infectives (From admission, onward)   Start     Dose/Rate Route Frequency Ordered Stop   06/05/18 1600  Tenofovir Alafenamide Fumarate TABS 25 mg     25 mg Oral Daily 06/05/18 0942     06/05/18 1400  Tenofovir Alafenamide Fumarate TABS 25 mg  Status:  Discontinued     25 mg Oral Daily 06/05/18 0941 06/05/18 0941   06/05/18 0000  Tenofovir Alafenamide Fumarate (VEMLIDY) 25 MG TABS     25 mg Oral Daily 06/05/18 1106       Subjective: Seen and examined at bedside and states that her constipation is improved.  Denies any chest pain, lightheadedness,, nausea or vomiting.  Denies any abdominal complaints.  She is awake and alert and oriented.  Happy that her LFTs are now trending down and excited that she may be able to go home tomorrow.  I discussed the case with GI Dr. Therisa Doyne who recommends that since her LFTs are trending downwards that she can likely go home tomorrow.  Since her T bili was still trending up and I discussed this with Dr. Therisa Doyne who states that T bili can be elevated several weeks after acute hepatitis flare and recommended  outpatient follow-up with Dr. Oletta Lamas.  Objective: Vitals:   06/09/18 0543 06/09/18 1356 06/09/18 2138 06/10/18 0555  BP: (!) 110/55 (!) 117/59 (!) 111/55 (!) 126/58  Pulse: 68 62 69 67  Resp: 16 18 18 18   Temp: 98.3 F (36.8 C) 98.8 F (37.1 C) 98.2 F (36.8 C) 98.6 F (37 C)  TempSrc: Oral Oral Oral Oral  SpO2: 94% 98% 96% 93%  Weight:      Height:        Intake/Output Summary (Last 24 hours) at 06/10/2018 1210 Last data filed at 06/10/2018 1003 Gross per 24 hour  Intake 1440 ml  Output 1800 ml  Net -360 ml   Filed Weights   06/02/18 1533  Weight: 78 kg   Examination: Physical Exam:  Constitutional: Well-nourished, well-developed obese Caucasian female currently no acute distress and she is resting  and happy that she may build will go home tomorrow. Eyes: Sclera is icteric ENMT: External ears and nose appear normal.  Mucous members are moist Neck: Appears supple no JVD Respiratory: Clear to auscultation bilaterally no appreciable wheezing, rales, rhonchi.  Patient was not tachypneic wheezing and accessory muscle breathe Cardiovascular: Regular rate and rhythm.  No appreciable murmurs, rubs, gallops.  No appreciable extremity edema Abdomen: Soft, nontender, distended slightly.  Bowel sounds present GU: Deferred Musculoskeletal: No contractures or cyanosis.  No joint fomites upper extremities Skin: Has jaundiced skin hue.  No appreciable rashes or lesions. Can evaluate Neurologic: Cranial nerves II through XII grossly intact.  Romberg sign cerebellar reflexes were not assessed Psychiatric:  Intact judgment insight.  Patient is awake, alert, oriented x3.  Has a pleasant mood and affect  Data Reviewed: I have personally reviewed following labs and imaging studies  CBC: Recent Labs  Lab 06/06/18 0432 06/07/18 0431 06/08/18 0409 06/09/18 0347 06/10/18 0315  WBC 4.5 4.5 4.4 5.2 5.9  NEUTROABS  --  3.0 2.8 3.3 3.8  HGB 10.5* 10.7* 10.3* 10.6* 10.2*  HCT 31.0* 31.5* 31.1* 31.0* 30.0*  MCV 91.7 91.0 90.4 90.1 90.6  PLT 111* 126* 111* 150 867*   Basic Metabolic Panel: Recent Labs  Lab 06/06/18 0432 06/07/18 0431 06/08/18 0409 06/09/18 0347 06/10/18 0315  NA 136 136 135 135 136  K 4.0 3.7 4.1 4.8 4.3  CL 107 106 107 102 104  CO2 26 25 25 25 26   GLUCOSE 92 90 91 93 94  BUN 9 11 10 10 10   CREATININE 0.85 0.82 0.76 0.49 0.80  CALCIUM 8.0* 8.0* 8.1* 8.2* 8.1*  MG  --  1.9 2.0 2.0 2.2  PHOS  --  4.0 3.9 3.8 4.1   GFR: Estimated Creatinine Clearance: 60.6 mL/min (by C-G formula based on SCr of 0.8 mg/dL). Liver Function Tests: Recent Labs  Lab 06/06/18 0432 06/07/18 0431 06/08/18 0409 06/09/18 0347 06/10/18 0315  AST 1,370* 1,586* 1,772* 1,990* 1,867*  ALT 825* 945*  991* 1,108* 1,060*  ALKPHOS 149* 140* 136* 126 116  BILITOT 14.2* 15.5* 17.2* 17.2* 20.0*  PROT 6.5 6.7 7.0 6.9 7.2  ALBUMIN 2.2* 2.2* 2.2* 2.1* 2.2*   No results for input(s): LIPASE, AMYLASE in the last 168 hours. No results for input(s): AMMONIA in the last 168 hours. Coagulation Profile: Recent Labs  Lab 06/06/18 0432 06/07/18 0431 06/08/18 0409 06/09/18 0347 06/10/18 0315  INR 1.5* 1.4* 1.6* 1.5* 1.5*   Cardiac Enzymes: No results for input(s): CKTOTAL, CKMB, CKMBINDEX, TROPONINI in the last 168 hours. BNP (last 3 results) No results for input(s): PROBNP in  the last 8760 hours. HbA1C: No results for input(s): HGBA1C in the last 72 hours. CBG: No results for input(s): GLUCAP in the last 168 hours. Lipid Profile: No results for input(s): CHOL, HDL, LDLCALC, TRIG, CHOLHDL, LDLDIRECT in the last 72 hours. Thyroid Function Tests: No results for input(s): TSH, T4TOTAL, FREET4, T3FREE, THYROIDAB in the last 72 hours. Anemia Panel: No results for input(s): VITAMINB12, FOLATE, FERRITIN, TIBC, IRON, RETICCTPCT in the last 72 hours. Sepsis Labs: No results for input(s): PROCALCITON, LATICACIDVEN in the last 168 hours.  No results found for this or any previous visit (from the past 240 hour(s)).   Radiology Studies: No results found.  Scheduled Meds: . polyethylene glycol  17 g Oral BID  . risperiDONE  0.5 mg Oral QHS  . senna-docusate  1 tablet Oral BID  . Tenofovir Alafenamide Fumarate  25 mg Oral Daily   Continuous Infusions:   LOS: 8 days   Kerney Elbe, DO [facility] PAGER is on AMION  If 7PM-7AM, please contact night-coverage [email or web address] Password [email or web address] 06/10/2018, 12:10 PM

## 2018-06-11 ENCOUNTER — Telehealth: Payer: Self-pay | Admitting: Pharmacy Technician

## 2018-06-11 DIAGNOSIS — E871 Hypo-osmolality and hyponatremia: Secondary | ICD-10-CM

## 2018-06-11 LAB — CBC WITH DIFFERENTIAL/PLATELET
Abs Immature Granulocytes: 0.07 10*3/uL (ref 0.00–0.07)
Basophils Absolute: 0.1 10*3/uL (ref 0.0–0.1)
Basophils Relative: 1 %
Eosinophils Absolute: 0.1 10*3/uL (ref 0.0–0.5)
Eosinophils Relative: 2 %
HCT: 31.6 % — ABNORMAL LOW (ref 36.0–46.0)
Hemoglobin: 10.8 g/dL — ABNORMAL LOW (ref 12.0–15.0)
Immature Granulocytes: 1 %
Lymphocytes Relative: 13 %
Lymphs Abs: 0.8 10*3/uL (ref 0.7–4.0)
MCH: 30.9 pg (ref 26.0–34.0)
MCHC: 34.2 g/dL (ref 30.0–36.0)
MCV: 90.3 fL (ref 80.0–100.0)
Monocytes Absolute: 1.1 10*3/uL — ABNORMAL HIGH (ref 0.1–1.0)
Monocytes Relative: 18 %
Neutro Abs: 4.1 10*3/uL (ref 1.7–7.7)
Neutrophils Relative %: 65 %
Platelets: 127 10*3/uL — ABNORMAL LOW (ref 150–400)
RBC: 3.5 MIL/uL — ABNORMAL LOW (ref 3.87–5.11)
RDW: 20.2 % — ABNORMAL HIGH (ref 11.5–15.5)
WBC: 6.3 10*3/uL (ref 4.0–10.5)
nRBC: 0 % (ref 0.0–0.2)

## 2018-06-11 LAB — MAGNESIUM: Magnesium: 2.2 mg/dL (ref 1.7–2.4)

## 2018-06-11 LAB — COMPREHENSIVE METABOLIC PANEL
ALT: 1030 U/L — ABNORMAL HIGH (ref 0–44)
ALT: 1111 U/L — ABNORMAL HIGH (ref 0–44)
AST: 1927 U/L — ABNORMAL HIGH (ref 15–41)
AST: 1991 U/L — ABNORMAL HIGH (ref 15–41)
Albumin: 2.1 g/dL — ABNORMAL LOW (ref 3.5–5.0)
Albumin: 2.2 g/dL — ABNORMAL LOW (ref 3.5–5.0)
Alkaline Phosphatase: 105 U/L (ref 38–126)
Alkaline Phosphatase: 113 U/L (ref 38–126)
Anion gap: 6 (ref 5–15)
Anion gap: 7 (ref 5–15)
BUN: 10 mg/dL (ref 8–23)
BUN: 11 mg/dL (ref 8–23)
CO2: 24 mmol/L (ref 22–32)
CO2: 26 mmol/L (ref 22–32)
Calcium: 8 mg/dL — ABNORMAL LOW (ref 8.9–10.3)
Calcium: 8.3 mg/dL — ABNORMAL LOW (ref 8.9–10.3)
Chloride: 103 mmol/L (ref 98–111)
Chloride: 102 mmol/L (ref 98–111)
Creatinine, Ser: 0.57 mg/dL (ref 0.44–1.00)
Creatinine, Ser: 0.89 mg/dL (ref 0.44–1.00)
GFR calc Af Amer: >60 mL/min (ref >60)
GFR calc Af Amer: >60 mL/min (ref >60)
GFR calc non Af Amer: >60 mL/min (ref >60)
GFR calc non Af Amer: >60 mL/min (ref >60)
Glucose, Bld: 98 mg/dL (ref 70–99)
Glucose, Bld: 113 mg/dL — ABNORMAL HIGH (ref 70–99)
Potassium: 4.7 mmol/L (ref 3.5–5.1)
Potassium: 3.8 mmol/L (ref 3.5–5.1)
Sodium: 133 mmol/L — ABNORMAL LOW (ref 135–145)
Sodium: 135 mmol/L (ref 135–145)
Total Bilirubin: 20.4 mg/dL (ref 0.3–1.2)
Total Bilirubin: 22.5 mg/dL (ref 0.3–1.2)
Total Protein: 7.1 g/dL (ref 6.5–8.1)
Total Protein: 7.6 g/dL (ref 6.5–8.1)

## 2018-06-11 LAB — PROTIME-INR
INR: 1.5 — ABNORMAL HIGH (ref 0.8–1.2)
Prothrombin Time: 18.3 seconds — ABNORMAL HIGH (ref 11.4–15.2)

## 2018-06-11 LAB — PHOSPHORUS: Phosphorus: 3.4 mg/dL (ref 2.5–4.6)

## 2018-06-11 MED ORDER — SODIUM CHLORIDE 0.9 % IV SOLN
INTRAVENOUS | Status: DC
  Administered 2018-06-11 – 2018-06-12 (×3): via INTRAVENOUS

## 2018-06-11 MED FILL — VEMLIDY 25 MG TABLET: 25 | 30 days supply | Qty: 30 | Fill #0

## 2018-06-11 NOTE — Progress Notes (Signed)
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  Patient has been feeling bad since 05/23/2018 and reported soft stools occurring every other day with yellow discoloration.  Family notes that her skin and a more yellowish hue.  Recently she has been receiving chemotherapy for follicular lymphoma at that time which was treated with tenderness.  To prevent remission of her hepatitis B however she stopped taking this after she completed her chemotherapy in December 2019.  She presented and has acute on chronic hepatitis B likely in the setting of to benefit discontinuation.    Gastroenterology and Infectious diseases were consulted for further evaluation recommendations and she underwent further work-up.  She is now back on to Tenofovir, however LFTs were worsening and and slowly started to trend down but have now trended up again. T Bili appears to be still trending up as well.  Infectious diseases is recommending keeping her at a minimum through Monday to ensure she gets better and has access to her uninterrupted Tenofovir Treatment and she used to be discharged today but her LFTs started trending upwards.  We will need to ensure the patient's LFTs are stable and trending downwards before we can discharge patient home.  Assessment & Plan:   Principal Problem:   Acute hepatitis Active Problems:   Hypokalemia   Chronic viral hepatitis B without delta-agent (HCC)   Follicular lymphoma grade II of lymph nodes of multiple sites [facility])  Depression   Acute liver failure without hepatic coma  Acute Hepatitis and likely re-activation of Chronic Hepatitis B and Hep B Flare  Abnormal LFTs, worsening still Hyperbilirubinemia, worsening -LFTs and T Bili significantly elevated compared to relatively normal labs on 05/16/2018.   -AST is now 1,991 (1867), ALT is 1,111 (1060), and T Bili is 22.5 (17.2); PT is 18.3 and INR is now 1.5 -Abdominal ultrasund showed normal portal venous flow, CBD diameter 3.4 mm.  Gallbladder was unable to be visualized due to overlying bowel versus decompressed gallbladder.   -Viral hepatitis is suspected.  Patient currently alert and oriented without abdominal pain. -Checked hepatitis A, B, and C studies.  Hepatitis D also a possibility with known hep B -this is a send out study -Hep A Ab, IgM negative, Hep B Surface Ag <3:1, HBsAg Conf Positive, HCV Ab 0.4; Hep B Cort Total Ab is POSITIVE; Hep B DNA test reveals 164,000,000 international units/cc -Checked HIV Ab, and Non-reactive  APAP level was <10; Ammonia Level was 33 -Supportive IV fluids now D/C'd -Continue to Monitor and Trend Hepatic Function as it is now improving; T Bili is the same as yesterday  -GI Consulted and recommending continuing Tenofovir 25 mg po Daily and that it will take several days before her LFT's stablize -ID was consulted and recommended restarting Tenofovir and they have procured this for the patient at D/C and Dr. Drucilla Schmidt has recommended keeping to at least Monday to ensure that she gets uninterrupted treatment and to make sure that she stabilizes improved -Patient was to be discharged today as LFTs have started trending downward yesterday but now they are trending  back up again so we will hold today's discharge and continue monitor and ensure the patient's liver functions are trending downwards before discharge -The patient back on low-dose IV fluids with normal saline at rate of 75 mL/hr -Repeat CMP in AM   Hypokalemia  -Improved. K+ was 4.7 this AM and repeat was 3.8  -Continue to monitor and replete as necessary -Repeat CMP in the a.m.  Stage IV Follicular Lymphoma -Follows with oncology Dr. Irene Limbo. Status post chemotherapy of  3 cycles of Bendamustine/Rituxan followed by 2 cycles of only Rituxan (due to neutropenia)(completed 10/2017) with complete metabolic response per last oncology note 05/23/2018.   -CT chest/abdomen/pelvis 05/17/2018 showed no residual or recurrent lymphadenopathy.  MDD with psychosis/PTSD/Panic disorder: -Follows with behavioral health, currently stable on home regimen of Zoloft 50 mg daily and Risperdal 0.5 mg nightly. -Continue Risperidone 0.5 mg po qHS -Hold Zoloft for now given liver dysfunction  Ectatic Abdominal Aorta -Seen on abdominal ultrasound, abdominal aorta measures 2.8 cm.   -Follow-up aortic ultrasound in 5 years recommended as an outpatient  Obesity -Estimated body mass index is 30.54 kg/m as calculated from the following:   Height as of this encounter: 5\' 2"  (1.575 m).   Weight as of this encounter: 75.8 kg. -Weight Loss and Dietary Counseling given   Pancytopenia, improving  -In the Setting of Chronic Disease and Follicular Lymphoma -WBC went from 2.9 at its lowest and is now 6.3 -Hb/Hct is stable at 10.8/31.6 -Platelet Count went from 102 -> 111 -> 126 -> 111 -> 150 -> 128 -> 127 -S/p Chemotherapy  -Checked Anemia Panel and showed iron level of 124, U IBC of 22, TIBC 146, saturation ratios of 85%, ferritin level 2187, folate level of 33.7, vitamin B12 level 1288 -Continue to Monitor for S/Sx of Bleeding -Repeat CBC in AM   Constipation -Patient had some constipation yesterday -Started patient on MiraLAX 17 g p.o. twice daily along with senna docusate 1 tab p.o. twice daily with improvement -Continue monitor  Hyponatremia -Mild at 133 this morning but repeat was 135 -We will start gentle IV fluid hydration with normal saline rate of 75 mL's per hour  -Continue monitor and trend sodium level -Repeat CMP in a.m.  DVT prophylaxis: SCDs Code Status: FULL CODE  Family Communication: No family present at bedside  Disposition Plan: D/C Home when Hepatic Function starts to improve and this will likely be tomorrow given that her LFTs are now trending down and that she will have her Tenofovir delivered to her home tomorrow.   Consultants:   [facility]  Infectious Diseases   Procedures: None  Antimicrobials:  Anti-infectives (From admission, onward)   Start     Dose/Rate Route Frequency Ordered Stop   06/05/18 1600  Tenofovir Alafenamide Fumarate TABS 25 mg     25 mg Oral Daily 06/05/18 0942     06/05/18 1400  Tenofovir Alafenamide Fumarate TABS 25 mg  Status:  Discontinued     25 mg Oral Daily 06/05/18 0941 06/05/18 0941   06/05/18 0000  Tenofovir Alafenamide Fumarate (VEMLIDY) 25 MG TABS     25 mg Oral Daily 06/05/18 1106       Subjective: Seen and examined at bedside and was hopeful that she could go home today but her LFTs started slowly trending back up again.  On repeat they were even more significantly elevated so discharge was held.  She denies any chest pain, lightheadedness or dizziness.  No nausea or vomiting.  Still remains frustrated and  a little bit anxious about her LFTs going back up again.  No other concerns or complaints at this time.  Objective: Vitals:   06/10/18 1420 06/10/18 1454 06/10/18 2054 06/11/18 0510  BP:   (!) 116/59 136/65  Pulse:   71 69  Resp:   20 20  Temp:   98.9 F (37.2 C) 98.8 F (37.1 C)  TempSrc:   Oral Oral  SpO2:  96% 98% 99%  Weight: 75.8 kg     Height:        Intake/Output Summary (Last 24 hours) at 06/11/2018 1201 Last data filed at 06/11/2018 1000 Gross per 24 hour  Intake 840 ml  Output 1700 ml  Net -860 ml   Filed Weights   06/02/18 1533 06/10/18 1420  Weight: 78 kg 75.8 kg   Examination: Physical Exam:  Constitutional: Well-nourished, well-developed  slightly obese Caucasian female currently no acute distress and remains frustrated as the discharge has been canceled given her LFTs being elevated again. Eyes: Lids and conjunctive a are normal but sclera is icteric.  It looks like she has some tattooed eyebrows ENMT: External ears and nose appear normal.  Mucous members are moist Neck: Appears supple no JVD Respiratory: Slightly diminished auscultation but no appreciable wheezing, rales, rhonchi.  Patient not tachypneic or using any accessory muscles to breathe Cardiovascular: Regular rate and rhythm.  No appreciable murmurs, rubs, gallops.  No lower extremity edema noted. Abdomen: Soft, nontender, distended mildly secondary to body habitus.  Bowel sounds present GU: Deferred Musculoskeletal: No contractures or cyanosis.  No joint deformities in the upper and lower extremites Skin: Is a jaundiced skin hue.  No appreciable rashes or lesions on to skin evaluation. Neurologic: Cranial nerves II through XII grossly intact no appreciable focal deficits.  Romberg sign cerebellar reflexes were not assessed Psychiatric: Intact judgment and insight.  Patient is awake, alert, oriented x3.  She is slightly anxious today  Data Reviewed: I have personally reviewed following labs and imaging studies  CBC: Recent Labs  Lab 06/07/18 0431 06/08/18 0409 06/09/18 0347 06/10/18 0315 06/11/18 0257  WBC 4.5 4.4 5.2 5.9 6.3  NEUTROABS 3.0 2.8 3.3 3.8 4.1  HGB 10.7* 10.3* 10.6* 10.2* 10.8*  HCT 31.5* 31.1* 31.0* 30.0* 31.6*  MCV 91.0 90.4 90.1 90.6 90.3  PLT 126* 111* 150 128* 681*   Basic Metabolic Panel: Recent Labs  Lab 06/07/18 0431 06/08/18 0409 06/09/18 0347 06/10/18 0315 06/11/18 0257 06/11/18 1042  NA 136 135 135 136 133* 135  K 3.7 4.1 4.8 4.3 4.7 3.8  CL 106 107 102 104 103 102  CO2 25 25 25 26 24 26   GLUCOSE 90 91 93 94 98 113*  BUN 11 10 10 10 10 11   CREATININE 0.82 0.76 0.49 0.80 0.57 0.89  CALCIUM 8.0* 8.1* 8.2* 8.1* 8.0* 8.3*   MG 1.9 2.0 2.0 2.2 2.2  --   PHOS 4.0 3.9 3.8 4.1 3.4  --    GFR: Estimated Creatinine Clearance: 53.7 mL/min (by C-G formula based on SCr of 0.89 mg/dL). Liver Function Tests: Recent Labs  Lab 06/08/18 0409 06/09/18 0347 06/10/18 0315 06/11/18 0257 06/11/18 1042  AST 1,772* 1,990* 1,867* 1,927* 1,991*  ALT 991* 1,108* 1,060* 1,030* 1,111*  ALKPHOS 136* 126 116 105 113  BILITOT 17.2* 17.2* 20.0* 20.4* 22.5*  PROT 7.0 6.9 7.2 7.1 7.6  ALBUMIN 2.2* 2.1* 2.2* 2.1* 2.2*   No results for input(s): LIPASE, AMYLASE in the last 168 hours. No results for input(s): AMMONIA  in the last 168 hours. Coagulation Profile: Recent Labs  Lab 06/07/18 0431 06/08/18 0409 06/09/18 0347 06/10/18 0315 06/11/18 0257  INR 1.4* 1.6* 1.5* 1.5* 1.5*   Cardiac Enzymes: No results for input(s): CKTOTAL, CKMB, CKMBINDEX, TROPONINI in the last 168 hours. BNP (last 3 results) No results for input(s): PROBNP in the last 8760 hours. HbA1C: No results for input(s): HGBA1C in the last 72 hours. CBG: No results for input(s): GLUCAP in the last 168 hours. Lipid Profile: No results for input(s): CHOL, HDL, LDLCALC, TRIG, CHOLHDL, LDLDIRECT in the last 72 hours. Thyroid Function Tests: No results for input(s): TSH, T4TOTAL, FREET4, T3FREE, THYROIDAB in the last 72 hours. Anemia Panel: No results for input(s): VITAMINB12, FOLATE, FERRITIN, TIBC, IRON, RETICCTPCT in the last 72 hours. Sepsis Labs: No results for input(s): PROCALCITON, LATICACIDVEN in the last 168 hours.  No results found for this or any previous visit (from the past 240 hour(s)).   Radiology Studies: No results found.  Scheduled Meds: . feeding supplement (ENSURE ENLIVE)  237 mL Oral BID BM  . multivitamin with minerals  1 tablet Oral Daily  . polyethylene glycol  17 g Oral BID  . risperiDONE  0.5 mg Oral QHS  . senna-docusate  1 tablet Oral BID  . Tenofovir Alafenamide Fumarate  25 mg Oral Daily   Continuous Infusions:   LOS: 9  days   Kerney Elbe, DO [facility] PAGER is on AMION  If 7PM-7AM, please contact night-coverage [email or web address] Password [email or web address] 06/11/2018, 12:01 PM

## 2018-06-11 NOTE — Care Management Important Message (Signed)
Important Message  Patient Details IM Letter given to Servando Snare SW to present to the Patient Name: Brooke Huber MRN: [id number] Date of Birth: 12-Jun-1945   Medicare Important Message Given:  Yes    Kerin Salen 06/11/2018, 10:42 AMImportant Message  Patient Details  Name: Brooke Huber MRN: [id number] Date of Birth: 03/20/1945   Medicare Important Message Given:  Yes    Kerin Salen 06/11/2018, 10:42 AM

## 2018-06-11 NOTE — Telephone Encounter (Signed)
Excellent

## 2018-06-11 NOTE — Telephone Encounter (Signed)
Jimmy Footman confirmed that Ms. Haynie is being discharged today from [facility].  We are shipping [employer] from [place] with Patient [place] covering her copayment.  Her address has been verified.  Brooke Huber. Nadara Mustard [place] Patient [facility] Phone: [phone] Fax:  [phone]

## 2018-06-12 DIAGNOSIS — K7200 Acute and subacute hepatic failure without coma: Secondary | ICD-10-CM

## 2018-06-12 LAB — MAGNESIUM: Magnesium: 2.3 mg/dL (ref 1.7–2.4)

## 2018-06-12 LAB — CBC WITH DIFFERENTIAL/PLATELET
Abs Immature Granulocytes: 0.05 10*3/uL (ref 0.00–0.07)
Basophils Absolute: 0 10*3/uL (ref 0.0–0.1)
Basophils Relative: 1 %
Eosinophils Absolute: 0.1 10*3/uL (ref 0.0–0.5)
Eosinophils Relative: 2 %
HCT: 31.4 % — ABNORMAL LOW (ref 36.0–46.0)
Hemoglobin: 10.2 g/dL — ABNORMAL LOW (ref 12.0–15.0)
Immature Granulocytes: 1 %
Lymphocytes Relative: 8 %
Lymphs Abs: 0.4 10*3/uL — ABNORMAL LOW (ref 0.7–4.0)
MCH: 30 pg (ref 26.0–34.0)
MCHC: 32.5 g/dL (ref 30.0–36.0)
MCV: 92.4 fL (ref 80.0–100.0)
Monocytes Absolute: 0.9 10*3/uL (ref 0.1–1.0)
Monocytes Relative: 19 %
Neutro Abs: 3.2 10*3/uL (ref 1.7–7.7)
Neutrophils Relative %: 69 %
Platelets: 128 10*3/uL — ABNORMAL LOW (ref 150–400)
RBC: 3.4 MIL/uL — ABNORMAL LOW (ref 3.87–5.11)
RDW: 20.8 % — ABNORMAL HIGH (ref 11.5–15.5)
WBC: 4.7 10*3/uL (ref 4.0–10.5)
nRBC: 0 % (ref 0.0–0.2)

## 2018-06-12 LAB — COMPREHENSIVE METABOLIC PANEL
ALT: 1012 U/L — ABNORMAL HIGH (ref 0–44)
AST: 1905 U/L — ABNORMAL HIGH (ref 15–41)
Albumin: 2.1 g/dL — ABNORMAL LOW (ref 3.5–5.0)
Alkaline Phosphatase: 98 U/L (ref 38–126)
Anion gap: 4 — ABNORMAL LOW (ref 5–15)
BUN: 10 mg/dL (ref 8–23)
CO2: 24 mmol/L (ref 22–32)
Calcium: 8 mg/dL — ABNORMAL LOW (ref 8.9–10.3)
Chloride: 108 mmol/L (ref 98–111)
Creatinine, Ser: 0.7 mg/dL (ref 0.44–1.00)
GFR calc Af Amer: >60 mL/min (ref >60)
GFR calc non Af Amer: >60 mL/min (ref >60)
Glucose, Bld: 103 mg/dL — ABNORMAL HIGH (ref 70–99)
Potassium: 3.7 mmol/L (ref 3.5–5.1)
Sodium: 136 mmol/L (ref 135–145)
Total Bilirubin: 22.4 mg/dL (ref 0.3–1.2)
Total Protein: 7.2 g/dL (ref 6.5–8.1)

## 2018-06-12 LAB — PROTIME-INR
INR: 1.3 — ABNORMAL HIGH (ref 0.8–1.2)
Prothrombin Time: 15.9 seconds — ABNORMAL HIGH (ref 11.4–15.2)

## 2018-06-12 LAB — PHOSPHORUS: Phosphorus: 3.5 mg/dL (ref 2.5–4.6)

## 2018-06-12 MED ORDER — POLYETHYLENE GLYCOL 3350 17 G PO PACK: 17.0000 g | PACK | Freq: Every day | ORAL | 0 refills | Status: AC

## 2018-06-12 MED ORDER — ADULT MULTIVITAMIN W/MINERALS CH: 1.0000 | ORAL_TABLET | Freq: Every day | ORAL | 0 refills | Status: AC

## 2018-06-12 MED ORDER — SENNOSIDES-DOCUSATE SODIUM 8.6-50 MG PO TABS: 1.0000 | ORAL_TABLET | Freq: Every evening | ORAL | 0 refills | Status: AC | PRN

## 2018-06-12 MED ORDER — ENSURE ENLIVE PO LIQD: 237.0000 mL | Freq: Two times a day (BID) | ORAL | 12 refills | Status: DC

## 2018-06-12 NOTE — Discharge Summary (Signed)
Physician Discharge Summary  Brooke Huber QMG:[id number] DOB: 13-May-1945 DOA: 06/02/2018  PCP: Alroy Dust, L.Marlou Sa, MD  Admit date: 06/02/2018 Discharge date: 06/12/2018  Admitted From: Home Disposition: Home  Recommendations for Outpatient Follow-up:  1. Follow up with PCP in 1-2 weeks 2. Follow-up with gastroenterology Dr. Oletta Lamas on 06/19/2018 at 1 PM and repeat CMP on 06/15/2018 at 12:45 PM at the central lab located off of [address] [place], [place] [address] 3. Follow-up with infectious diseases Dr. Linus Salmons in 1 to 2 weeks 4. Follow-up with Dr. Irene Limbo as deemed necessary 5. Please obtain CMP/CBC, Mag, Phos in one week 6. 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  Patient has been feeling bad since 05/23/2018 and reported soft stools occurring every other day with yellow discoloration.  Family notes that her skin and a more yellowish hue.  Recently she has been receiving chemotherapy for follicular lymphoma at that time which was treated with tenderness.  To prevent remission of her hepatitis B however she stopped taking this after she completed her chemotherapy in December 2019.  She presented and has acute on chronic hepatitis B likely in the setting of to benefit discontinuation.    Gastroenterology and Infectious diseases were consulted for further evaluation recommendations and she underwent further work-up.  She is now back on  to Tenofovir, however LFTs were worsening and and slowly started to trend down and then trended up slight but stabilized. T Bili appeared to be still trending up as well but has now stablized.  Infectious diseases was recommending keeping her at a minimum through Monday to ensure she gets better and has access to her uninterrupted Tenofovir Treatment and will receive delivery at Home.  I discussed the case with Dr. Johnn Hai of gastroenterology and he had no reservation of discharging the patient as her LFTs have now stabilized and trended down slightly.  He recommends follow-up with Dr. Oletta Lamas within 1 week an appointment is been made and repeat blood work is to be done on Friday.  Patient will also need to see Dr. Chana Bode in the outpatient setting.  She also need to follow-up with PCP within 1 week and ensure that repeat blood work was done.  She was deemed medically stable to be discharged at this time she will need to follow-up with PCP, gastroenterology and infectious diseases and she is understandable and agreeable with the plan of care going forward.  Discharge Diagnoses:  Principal Problem:   Acute hepatitis Active Problems:   Hypokalemia   Chronic viral hepatitis B without delta-agent (HCC)   Follicular lymphoma grade II of lymph nodes of multiple sites [facility])   Depression   Acute liver failure without hepatic coma  Acute Hepatitis and likely re-activation of Chronic Hepatitis B and Hep B Flare  Abnormal LFTs, Elevated but stable and slightly trending down Hyperbilirubinemia, Elevated but stable  -LFTs and T Bili significantly elevated compared to relatively normal labs on 05/16/2018.  -AST is now 1,905 (1992), ALT is 1,012 (1111), and  T Bili is 22.4 (22.5); PT is 18.3 and INR is now 1.5 -Abdominal ultrasund showed normal portal venous flow, CBD diameter 3.4 mm. Gallbladder was unable to be visualized due to overlying bowel versus decompressed gallbladder.  -Viral hepatitis is  suspected. Patient currently alert and oriented without abdominal pain. -Checked hepatitis A, B, and C studies. Hepatitis D also a possibility with known hep B -this is a send out study -Hep A Ab, IgM negative, Hep B Surface Ag <3:1, HBsAg Conf Positive, HCV Ab 0.4; Hep B Cort Total Ab is POSITIVE; Hep B DNA test reveals 164,000,000 international units/cc -Checked HIVAb, and Non-reactive APAP level was <10; Ammonia Level was 33 -Supportive IV fluids now D/C'd -Continue to Monitor and Trend Hepatic Function as it is now improving; T Bili is the same as yesterday  -GI Consulted and recommending continuing Tenofovir 25 mg po Daily and that it will take several days before her LFT's stablize -ID was consulted and recommended restarting Tenofovir and they have procured this for the patient at D/C and Dr. Drucilla Schmidt has recommended keeping to at least Monday to ensure that she gets uninterrupted treatment and to make sure that she stabilizes improved -Patient was to be discharged yesterday as LFTs had started trending downward yesterday but now they are trending back up again held discharge and continue monitor and ensure the patient's liver functions are trending downwards before discharge; They are stable and trending down  -The patient back on low-dose IV fluids with normal saline at rate of 75 mL/hr -Repeat CMP as an outpatient   Hypokalemia -Improved. K+ was 3.7 this AM  -Continue to monitor and replete as necessary -Repeat CMP as an outpatient   Stage IV Follicular Lymphoma -Follows with oncology Dr.Kale.Status post chemotherapy of 3 cycles of Bendamustine/Rituxan followed by 2 cycles of only Rituxan (due to neutropenia)(completed 10/2017) with complete metabolic response per last oncology note 05/23/2018.  -CT chest/abdomen/pelvis 05/17/2018 showed no residual or recurrent lymphadenopathy. -Follow up with Dr. Irene Limbo as an outpatient   MDD with psychosis/PTSD/Panic disorder: -Follows with  behavioral health, currently stable on home regimen of Zoloft 50 mg daily and Risperdal 0.5 mg nightly. -Continue Risperidone 0.5 mg po qHS -Hold Zoloft for now given liver dysfunction and defer to PCP when to Resume  Ectatic Abdominal Aorta -Seen on abdominal ultrasound, abdominal aorta measures 2.8 cm.  -Follow-up aortic ultrasound in 5 years recommended as an outpatient  Obesity -Estimated body mass index is 30.54 kg/m as calculated from the following:   Height as of this encounter: 5\' 2"  (1.575 m).   Weight as of this encounter: 75.8 kg. -Weight Loss and Dietary Counseling given   Pancytopenia, improving  -In the Setting of Chronic Disease and Follicular Lymphoma -WBC went from 2.9 at its lowest and is now 4.7 -Hb/Hct is stable at 10.2/31.4 -Platelet Count went from 102 -> 111 -> 126 -> 111 -> 150 -> 128 -> 127 -> 128 -S/p Chemotherapy  -Checked Anemia Panel and showed iron level of 124, U IBC of 22, TIBC 146, saturation ratios of 85%, ferritin level 2187, folate level of 33.7, vitamin B12 level 1288 -Continue to Monitor for S/Sx of Bleeding -Repeat CBC as an outpatient    Constipation -Patient had some constipation yesterday -Started patient on MiraLAX 17 g p.o. twice daily along with senna docusate 1 tab p.o. twice daily with improvement and will send home with Miralax daily and Senna-Docusate prn  -Continue monitor as an outpatient  Hyponatremia -Mild at 133 this  morning but repeat was 136 -IVF Hydration now stopped -Continue monitor and trend sodium level -Repeat CMP within 1 week   Discharge Instructions  Discharge Instructions    Call MD for:  difficulty breathing, headache or visual disturbances   Complete by:  As directed    Call MD for:  extreme fatigue   Complete by:  As directed    Call MD for:  hives   Complete by:  As directed    Call MD for:  persistant dizziness or light-headedness   Complete by:  As directed    Call MD for:  persistant nausea  and vomiting   Complete by:  As directed    Call MD for:  redness, tenderness, or signs of infection (pain, swelling, redness, odor or green/yellow discharge around incision site)   Complete by:  As directed    Call MD for:  severe uncontrolled pain   Complete by:  As directed    Call MD for:  temperature >100.4   Complete by:  As directed    Diet - low sodium heart healthy   Complete by:  As directed    Discharge instructions   Complete by:  As directed    You were cared for by a hospitalist during your hospital stay. If you have any questions about your discharge medications or the care you received while you were in the hospital after you are discharged, you can call the unit and ask to speak with the hospitalist on call if the hospitalist that took care of you is not available. Once you are discharged, your primary care physician will handle any further medical issues. Please note that NO REFILLS for any discharge medications will be authorized once you are discharged, as it is imperative that you return to your primary care physician (or establish a relationship with a primary care physician if you do not have one) for your aftercare needs so that they can reassess your need for medications and monitor your lab values.  Follow up with PCP, Gastroenterology, and Infectious Diseases as an outpatient. Take all medications as prescribed. If symptoms change or worsen please return to the ED for evaluation   Increase activity slowly   Complete by:  As directed      Allergies as of 06/12/2018      Reactions   Penicillins Hives, Rash   Did it involve swelling of the face/tongue/throat, SOB, or low BP? N Did it involve sudden or severe rash/hives, skin peeling, or any reaction on the inside of your mouth or nose? Y Did you need to seek medical attention at a hospital or doctor's office? N When did it last happen? If all above answers are "NO", may proceed with cephalosporin use.       Medication List    STOP taking these medications   acyclovir 400 MG tablet Commonly known as:  ZOVIRAX   calcium-vitamin D 500-200 MG-UNIT tablet Commonly known as:  OSCAL WITH D   clotrimazole 1 % cream Commonly known as:  LOTRIMIN   dexamethasone 4 MG tablet Commonly known as:  DECADRON   sertraline 50 MG tablet Commonly known as:  Zoloft     TAKE these medications   Calcium Carb-Cholecalciferol 600-100 MG-UNIT Caps Take 1 tablet by mouth daily.   feeding supplement (ENSURE ENLIVE) Liqd Take 237 mLs by mouth 2 (two) times daily between meals.   LORazepam 0.5 MG tablet Commonly known as:  Ativan Take 1 tablet (0.5 mg total) by mouth  every 6 (six) hours as needed (Nausea or vomiting).   multivitamin with minerals Tabs tablet Take 1 tablet by mouth daily. Start taking on:  Jun 13, 2018   ondansetron 8 MG tablet Commonly known as:  Zofran Take 1 tablet (8 mg total) by mouth 2 (two) times daily as needed for refractory nausea / vomiting. Start on day 2 after bendamustine chemo.   polyethylene glycol 17 g packet Commonly known as:  MIRALAX / GLYCOLAX Take 17 g by mouth daily.   prochlorperazine 10 MG tablet Commonly known as:  COMPAZINE Take 1 tablet (10 mg total) by mouth every 6 (six) hours as needed (Nausea or vomiting).   risperiDONE 0.5 MG tablet Commonly known as:  RISPERDAL Take 1 tablet (0.5 mg total) by mouth at bedtime.   senna-docusate 8.6-50 MG tablet Commonly known as:  Senokot-S Take 1 tablet by mouth at bedtime as needed for mild constipation.   Tenofovir Alafenamide Fumarate 25 MG Tabs Commonly known as:  Vemlidy Take 1 tablet (25 mg total) by mouth daily.      Follow-up Information    Alroy Dust, L.Marlou Sa, MD. Call.   Specialty:  Family Medicine Why:  Follow up within 1 week Contact information: [address] [place] [address] [phone]        Laurence Spates, MD. Go to.   Specialty:  Gastroenterology Why:   Appointment scheduled for 06/19/2018 at 1:00 pm; PLEASE GO TO CENTRAL LAB on 06/15/2018 at 12:45 to repeat Labwork prior to seeing Dr. Oletta Lamas. Address is [address], [place], [address] on the [address].  Contact information: [address] [place] [address] [phone]        Thayer Headings, MD. Call.   Specialty:  Infectious Diseases Why:  Follow up with Dr. Linus Salmons in 1-2 weeks Contact information: [address] [place] [address] [phone]          Allergies  Allergen Reactions  . Penicillins Hives and Rash    Did it involve swelling of the face/tongue/throat, SOB, or low BP? N Did it involve sudden or severe rash/hives, skin peeling, or any reaction on the inside of your mouth or nose? Y Did you need to seek medical attention at a hospital or doctor's office? N When did it last happen? If all above answers are "NO", may proceed with cephalosporin use.    Consultations:  Infectious Diseases  Gastroenterology  Procedures/Studies: Ct Chest W Contrast  Result Date: 05/17/2018 CLINICAL DATA:  Follow-up follicular lymphoma EXAM: CT CHEST, ABDOMEN, AND PELVIS WITH CONTRAST TECHNIQUE: Multidetector CT imaging of the chest, abdomen and pelvis was performed following the standard protocol during bolus administration of intravenous contrast. 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 Normal heart size. No pericardial effusion. Mediastinum/Nodes: No enlarged mediastinal, hilar, or axillary lymph nodes. Thyroid gland, trachea, and esophagus demonstrate no significant findings. Lungs/Pleura: Stable 7 mm ground-glass opacity of the right upper lobe (series 6, image 54). No pleural effusion or pneumothorax. Musculoskeletal:  No chest wall mass or suspicious bone lesions identified. CT ABDOMEN PELVIS FINDINGS Hepatobiliary: Low-attenuation cysts of the liver. No gallstones, gallbladder wall thickening, or biliary dilatation. Pancreas: Unremarkable. No pancreatic ductal dilatation or surrounding inflammatory changes. Spleen: Mild splenomegaly, maximum span 13.2 cm, significantly decreased in size compared to prior examinations.  Adrenals/Urinary Tract: Adrenal glands are unremarkable. Kidneys are normal, without renal calculi, focal lesion, or hydronephrosis. Bladder is unremarkable. Stomach/Bowel: Stomach is within normal limits. No evidence of bowel wall thickening, distention, or inflammatory changes. Occasional sigmoid diverticula. Vascular/Lymphatic: No significant vascular findings are present. No enlarged abdominal or pelvic lymph nodes. Reproductive: No mass or other abnormality. Other: No abdominal wall hernia or abnormality. No abdominopelvic ascites. Musculoskeletal: No acute or significant osseous findings. IMPRESSION: 1. No residual or recurrent lymphadenopathy in the chest, abdomen, or pelvis. 2. Mild splenomegaly, maximum span 13.2 cm, significantly decreased in size compared to prior examinations. 3. Stable incidental 7 mm ground-glass opacity of the right upper lobe (series 6, image 54). Attention on follow-up. Electronically Signed   By: Eddie Candle M.D.   On: 05/17/2018 09:20   US Abdomen Complete  Result Date: 06/02/2018 CLINICAL DATA:  Transaminitis.  Biliary obstruction. EXAM: ABDOMEN ULTRASOUND COMPLETE COMPARISON:  None. FINDINGS: Gallbladder: Gallbladder not visualized secondary to overlying bowel gas versus decompressed gallbladder. Common bile duct: Diameter: 3.4 mm Liver: 2.6 x 3.4 x 2.7 cm anechoic right hepatic mass consistent with a cyst. Smaller 10 mm left hepatic cyst. Increased hepatic parenchymal echogenicity. Portal vein is patent on color Doppler imaging with normal direction of blood flow towards  the liver. IVC: No abnormality visualized. Pancreas: Visualized portion unremarkable. Spleen: Spleen is enlarged measuring 13.6 cm in length with a volume of 469 mL. No focal splenic lesion. Right Kidney: Length: 9.4 cm. Echogenicity within normal limits. No mass or hydronephrosis visualized. Left Kidney: Length: 9.6 cm. Echogenicity within normal limits. No mass or hydronephrosis visualized. Abdominal aorta: Abdominal aorta measures 2.8 cm. Other findings: None IMPRESSION: 1. Gallbladder not visualized likely secondary to a contracted state. 2. Splenomegaly. 3. Increased hepatic echogenicity as can be seen with hepatic steatosis. 4. Ectatic abdominal aorta, at risk for aneurysm development. Recommend follow-up aortic ultrasound in 5 years. This recommendation follows ACR consensus guidelines: White Paper of the ACR Incidental Findings Committee II on Vascular Findings. J Am Coll Radiol 2013; 10:789-794. Electronically Signed   By: Kathreen Devoid   On: 06/02/2018 18:30   Ct Abdomen Pelvis W Contrast  Result Date: 05/17/2018 CLINICAL DATA:  Follow-up follicular lymphoma EXAM: CT CHEST, ABDOMEN, AND PELVIS WITH CONTRAST TECHNIQUE: Multidetector CT imaging of the chest, abdomen and pelvis was performed following the standard protocol during bolus administration of intravenous contrast. 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 Normal heart size. No pericardial effusion. Mediastinum/Nodes: No enlarged mediastinal, hilar, or axillary lymph nodes. Thyroid gland, trachea, and esophagus demonstrate no significant findings. Lungs/Pleura: Stable 7 mm ground-glass opacity of the right upper lobe (series 6, image 54). No pleural effusion or pneumothorax. Musculoskeletal: No chest wall mass or suspicious bone lesions  identified. CT ABDOMEN PELVIS FINDINGS Hepatobiliary: Low-attenuation cysts of the liver. No gallstones, gallbladder wall thickening, or biliary dilatation. Pancreas: Unremarkable. No pancreatic ductal dilatation or surrounding inflammatory changes. Spleen: Mild splenomegaly, maximum span 13.2 cm, significantly decreased in size compared to prior examinations. Adrenals/Urinary Tract: Adrenal glands are unremarkable. Kidneys are normal, without renal calculi, focal lesion, or hydronephrosis. Bladder is unremarkable. Stomach/Bowel: Stomach is within normal limits. No evidence of bowel wall thickening, distention, or inflammatory changes. Occasional sigmoid diverticula. Vascular/Lymphatic: No significant vascular findings are present. No enlarged abdominal or pelvic lymph nodes. Reproductive: No mass or other abnormality. Other:  No abdominal wall hernia or abnormality. No abdominopelvic ascites. Musculoskeletal: No acute or significant osseous findings. IMPRESSION: 1. No residual or recurrent lymphadenopathy in the chest, abdomen, or pelvis. 2. Mild splenomegaly, maximum span 13.2 cm, significantly decreased in size compared to prior examinations. 3. Stable incidental 7 mm ground-glass opacity of the right upper lobe (series 6, image 54). Attention on follow-up. Electronically Signed   By: Eddie Candle M.D.   On: 05/17/2018 09:20     Subjective: Seen and examined at bedside no complaints.  Denies nausea, vomiting, lightheadedness or dizziness.  Felt well and happy that her liver functions are stabilized and trended downward slightly.  I discussed the case with Dr. Driscilla Grammes of gastroenterology and he has no hesitation or reservation to keep the patient in the hospital and recommends outpatient follow-up with Dr. Oletta Lamas as well as infectious disease.  He recommends follow-up blood work and repeating a CMP by Friday and this is been arranged.  Patient will be discharged home will need to follow-up with PCP, infectious  disease, gastroenterology as well as oncology in the outpatient setting and she is understandable and agreeable with the plan of care   Discharge Exam: Vitals:   06/12/18 0506 06/12/18 1400  BP: (!) 125/57 (!) 120/55  Pulse: 70 67  Resp: 18 18  Temp: 98.2 F (36.8 C) 98.3 F (36.8 C)  SpO2: 95%    Vitals:   06/11/18 2053 06/12/18 0200 06/12/18 0506 06/12/18 1400  BP: (!) 141/62  (!) 125/57 (!) 120/55  Pulse: 70  70 67  Resp: 18  18 18   Temp: (!) 100.8 F (38.2 C) 99.9 F (37.7 C) 98.2 F (36.8 C) 98.3 F (36.8 C)  TempSrc: Oral Oral Oral Oral  SpO2: 100%  95%   Weight:      Height:       General: Pt is alert, awake, not in acute distress; Remains severely jaundiced Cardiovascular: RRR, S1/S2 +, no rubs, no gallops Respiratory: Diminished bilaterally, no wheezing, no rhonchi Abdominal: Soft, NT, Distended slightly, bowel sounds + Extremities: Trace edema, no cyanosis  The results of significant diagnostics from this hospitalization (including imaging, microbiology, ancillary and laboratory) are listed below for reference.    Microbiology: No results found for this or any previous visit (from the past 240 hour(s)).   Labs: BNP (last 3 results) No results for input(s): BNP in the last 8760 hours. Basic Metabolic Panel: Recent Labs  Lab 06/08/18 0409 06/09/18 0347 06/10/18 0315 06/11/18 0257 06/11/18 1042 06/12/18 0927  NA 135 135 136 133* 135 136  K 4.1 4.8 4.3 4.7 3.8 3.7  CL 107 102 104 103 102 108  CO2 25 25 26 24 26 24   GLUCOSE 91 93 94 98 113* 103*  BUN 10 10 10 10 11 10   CREATININE 0.76 0.49 0.80 0.57 0.89 0.70  CALCIUM 8.1* 8.2* 8.1* 8.0* 8.3* 8.0*  MG 2.0 2.0 2.2 2.2  --  2.3  PHOS 3.9 3.8 4.1 3.4  --  3.5   Liver Function Tests: Recent Labs  Lab 06/09/18 0347 06/10/18 0315 06/11/18 0257 06/11/18 1042 06/12/18 0927  AST 1,990* 1,867* 1,927* 1,991* 1,905*  ALT 1,108* 1,060* 1,030* 1,111* 1,012*  ALKPHOS 126 116 105 113 98  BILITOT 17.2*  20.0* 20.4* 22.5* 22.4*  PROT 6.9 7.2 7.1 7.6 7.2  ALBUMIN 2.1* 2.2* 2.1* 2.2* 2.1*   No results for input(s): LIPASE, AMYLASE in the last 168 hours. No results for input(s): AMMONIA in the last 168 hours.  CBC: Recent Labs  Lab 06/08/18 0409 06/09/18 0347 06/10/18 0315 06/11/18 0257 06/12/18 0927  WBC 4.4 5.2 5.9 6.3 4.7  NEUTROABS 2.8 3.3 3.8 4.1 3.2  HGB 10.3* 10.6* 10.2* 10.8* 10.2*  HCT 31.1* 31.0* 30.0* 31.6* 31.4*  MCV 90.4 90.1 90.6 90.3 92.4  PLT 111* 150 128* 127* 128*   Cardiac Enzymes: No results for input(s): CKTOTAL, CKMB, CKMBINDEX, TROPONINI in the last 168 hours. BNP: Invalid input(s): POCBNP CBG: No results for input(s): GLUCAP in the last 168 hours. D-Dimer No results for input(s): DDIMER in the last 72 hours. Hgb A1c No results for input(s): HGBA1C in the last 72 hours. Lipid Profile No results for input(s): CHOL, HDL, LDLCALC, TRIG, CHOLHDL, LDLDIRECT in the last 72 hours. Thyroid function studies No results for input(s): TSH, T4TOTAL, T3FREE, THYROIDAB in the last 72 hours.  Invalid input(s): FREET3 Anemia work up No results for input(s): VITAMINB12, FOLATE, FERRITIN, TIBC, IRON, RETICCTPCT in the last 72 hours. Urinalysis    Component Value Date/Time   COLORURINE AMBER (A) 06/02/2018 1615   APPEARANCEUR CLEAR 06/02/2018 1615   LABSPEC 1.025 06/02/2018 1615   PHURINE 5.0 06/02/2018 1615   GLUCOSEU NEGATIVE 06/02/2018 1615   HGBUR NEGATIVE 06/02/2018 1615   BILIRUBINUR MODERATE (A) 06/02/2018 1615   KETONESUR NEGATIVE 06/02/2018 1615   PROTEINUR 30 (A) 06/02/2018 1615   UROBILINOGEN 1.0 06/08/2014 1559   NITRITE NEGATIVE 06/02/2018 1615   LEUKOCYTESUR NEGATIVE 06/02/2018 1615   Sepsis Labs Invalid input(s): PROCALCITONIN,  WBC,  LACTICIDVEN Microbiology No results found for this or any previous visit (from the past 240 hour(s)).  Time coordinating discharge: 35 minutes  SIGNED:  Kerney Elbe, DO [facility] 06/12/2018,  8:49 PM Pager is on [place]  If 7PM-7AM, please contact night-coverage [email or web address] Password [email or web address]

## 2018-06-12 NOTE — Progress Notes (Signed)
Subjective:  Concern over lfts  Antibiotics:  Anti-infectives (From admission, onward)   Start     Dose/Rate Route Frequency Ordered Stop   06/05/18 1600  Tenofovir Alafenamide Fumarate TABS 25 mg     25 mg Oral Daily 06/05/18 0942     06/05/18 1400  Tenofovir Alafenamide Fumarate TABS 25 mg  Status:  Discontinued     25 mg Oral Daily 06/05/18 0941 06/05/18 0941   06/05/18 0000  Tenofovir Alafenamide Fumarate (VEMLIDY) 25 MG TABS     25 mg Oral Daily 06/05/18 1106        Medications: Scheduled Meds: . feeding supplement (ENSURE ENLIVE)  237 mL Oral BID BM  . multivitamin with minerals  1 tablet Oral Daily  . polyethylene glycol  17 g Oral BID  . risperiDONE  0.5 mg Oral QHS  . senna-docusate  1 tablet Oral BID  . Tenofovir Alafenamide Fumarate  25 mg Oral Daily   Continuous Infusions: . sodium chloride 75 mL/hr at 06/12/18 0745   PRN Meds:.ondansetron **OR** [DISCONTINUED] ondansetron (ZOFRAN) IV    Objective: Weight change:   Intake/Output Summary (Last 24 hours) at 06/12/2018 1409 Last data filed at 06/12/2018 1000 Gross per 24 hour  Intake 2722.68 ml  Output 1400 ml  Net 1322.68 ml   Blood pressure (!) 120/55, pulse 67, temperature 98.3 F (36.8 C), temperature source Oral, resp. rate 18, height 5\' 2"  (1.575 m), weight 75.8 kg, SpO2 95 %. Temp:  [98.2 F (36.8 C)-100.8 F (38.2 C)] 98.3 F (36.8 C) (05/12 1400) Pulse Rate:  [67-70] 67 (05/12 1400) Resp:  [18] 18 (05/12 1400) BP: (120-141)/(55-62) 120/55 (05/12 1400) SpO2:  [95 %-100 %] 95 % (05/12 0506)  Physical Exam: General: Alert and awake, oriented x3, not in any acute distress. HEENT: nicteric sclera, EOMI CVS regular rate, normal  Chest: , no wheezing, no respiratory distress Abdomen: soft non-distended,  Extremities: no edema or deformity noted bilaterally Skin: no rashes Neuro: nonfocal  CBC:    BMET Recent Labs    06/11/18 1042 06/12/18 0927  NA 135 136  K 3.8 3.7  CL  102 108  CO2 26 24  GLUCOSE 113* 103*  BUN 11 10  CREATININE 0.89 0.70  CALCIUM 8.3* 8.0*     Liver Panel  Recent Labs    06/11/18 1042 06/12/18 0927  PROT 7.6 7.2  ALBUMIN 2.2* 2.1*  AST 1,991* 1,905*  ALT 1,111* 1,012*  ALKPHOS 113 98  BILITOT 22.5* 22.4*       Sedimentation Rate No results for input(s): ESRSEDRATE in the last 72 hours. C-Reactive Protein No results for input(s): CRP in the last 72 hours.  Micro Results: No results found for this or any previous visit (from the past 720 hour(s)).  Studies/Results: No results found.    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 female with reactivation of her chronic hepatitis B after stopping Vemlidy this fall which she was taking as a prophylactic measure in the context of chemotherapy.  1.  Acute hepatitis B flare:  CMP Latest Ref Rng & Units 06/12/2018 06/11/2018 06/11/2018  Glucose 70 -  99 mg/dL 103(H) 113(H) 98  BUN 8 - 23 mg/dL 10 11 10   Creatinine 0.44 - 1.00 mg/dL 0.70 0.89 0.57  Sodium 135 - 145 mmol/L 136 135 133(L)  Potassium 3.5 - 5.1 mmol/L 3.7 3.8 4.7  Chloride 98 - 111 mmol/L 108 102 103  CO2 22 - 32 mmol/L 24 26 24   Calcium 8.9 - 10.3 mg/dL 8.0(L) 8.3(L) 8.0(L)  Total Protein 6.5 - 8.1 g/dL 7.2 7.6 7.1  Total Bilirubin 0.3 - 1.2 mg/dL 22.4(HH) 22.5(HH) 20.4(HH)  Alkaline Phos 38 - 126 U/L 98 113 105  AST 15 - 41 U/L 1,905(H) 1,991(H) 1,927(H)  ALT 0 - 44 U/L 1,012(H) 1,111(H) 1,030(H)   LFTs seem to be stabilizing  Continue Vemlidy  After she is DC she NEEDS LFTS checked again would defer to GI on timing  This can be done at PCP, GI or with [name] but should be  done  I am arranging with follow-up with Dr. Linus Huber.    LOS: 10 days   Brooke Huber 06/12/2018, 2:09 PM

## 2018-06-15 ENCOUNTER — Other Ambulatory Visit: Payer: Self-pay | Admitting: *Deleted

## 2018-06-15 DIAGNOSIS — R7989 Other specified abnormal findings of blood chemistry: Secondary | ICD-10-CM | POA: Diagnosis not present

## 2018-06-15 DIAGNOSIS — R740 Nonspecific elevation of levels of transaminase and lactic acid dehydrogenase [LDH]: Secondary | ICD-10-CM | POA: Diagnosis not present

## 2018-06-15 NOTE — Patient Outreach (Signed)
[facility]) Care Management  06/15/2018  BRALEIGH MASSOUD 12-01-1945 [id number]   Subjective: Telephone call to patient's home number, spoke with patient, and HIPAA verified.  Discussed [facility] Care Management [employer] EMMI General Discharge Red Alert Flag follow up, patient voiced understanding, and is in agreement to follow up.  Patient states she is feeling a little tired this afternoon, had blood work drawn at gastroenterologist office today, picked up  Some prescriptions from pharmacy, and ran some errands.   Patient states she remembers getting EMMI automated calls, has her discharge papers, has filled her prescriptions, her bilateral arm wounds (caused by allergic reaction to tape used ) from hospital blood work, and IV site are healing.   Patient states she is aware of signs/ symptoms to report, how to reach provider if needed after hours, when to go to ED, and / or call 911.  States she will follow up with primary MD regarding arm wounds if they do not get any better and hospital is aware of wounds prior to hospital discharge.   States right arm wound is healing better than left arm wound, she will continue to monitor, and follow up with primary MD as needed.  Patient planning to follow up with hospital to obtain copy of discharge summary and other medical records as needed.  Patient states she has the following scheduled hospital follow up appointments: with gastroenterologist on 06/18/2018, with primary MD on 06/19/2018, and with infectious disease MD on 06/21/2018.  Patient states she is able to manage self care and has assistance as needed. Patient voices understanding of medical diagnosis and treatment plan.  Patient states she does not have any education material, EMMI follow up, care coordination, care management, disease monitoring, transportation, community resource, or pharmacy needs at this time.  States she is very appreciative of the follow up.    Objective:  Per KPN (Knowledge Performance Now, point of care tool) and chart review, patient hospitalized 06/02/2018 -06/12/2018 for Acute hepatitis, Acute liver failure without hepatic coma.   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       Assessment: Received [employer] EMMI General Discharge Red Flag Alert times 5, follow up referral on 06/15/2018.   Red Flag Alert Triggers: patient answered I do not know to the following questions:  Got discharge papers?  New prescriptions?   Patient answered no to the following questions: Know who to call about changes in condition?   Wounds healing well?  Patient answered yes to the following question: Other questions/problems?    EMMI follow up completed and no further care management needs.      Plan: RNCM will complete case closure due to follow up completed / no care management needs.       Edker Punt H. Annia Friendly, BSN, [place] Management [facility] Telephonic CM Phone: [phone] Fax: [phone]

## 2018-06-18 ENCOUNTER — Telehealth: Payer: Self-pay | Admitting: *Deleted

## 2018-06-18 DIAGNOSIS — F419 Anxiety disorder, unspecified: Secondary | ICD-10-CM | POA: Diagnosis not present

## 2018-06-18 DIAGNOSIS — C8590 Non-Hodgkin lymphoma, unspecified, unspecified site: Secondary | ICD-10-CM | POA: Diagnosis not present

## 2018-06-18 DIAGNOSIS — K5900 Constipation, unspecified: Secondary | ICD-10-CM | POA: Diagnosis not present

## 2018-06-18 DIAGNOSIS — B169 Acute hepatitis B without delta-agent and without hepatic coma: Secondary | ICD-10-CM | POA: Diagnosis not present

## 2018-06-18 NOTE — Telephone Encounter (Signed)
Patient left voice mail - was d/c'd from hospital on 5/11. They put Dexamethasone on her med list on discharge. She said she hasn't taken it since last fall when she was taking chemo. She wants to know if Dr. Irene Limbo wants her to start back taking it.  Contacted patient - per Dr. Irene Limbo, she does not need dexamethasone from him. Patient verbalized understanding.

## 2018-06-19 DIAGNOSIS — B169 Acute hepatitis B without delta-agent and without hepatic coma: Secondary | ICD-10-CM | POA: Diagnosis not present

## 2018-06-19 DIAGNOSIS — B181 Chronic viral hepatitis B without delta-agent: Secondary | ICD-10-CM | POA: Diagnosis not present

## 2018-06-19 DIAGNOSIS — K5900 Constipation, unspecified: Secondary | ICD-10-CM | POA: Diagnosis not present

## 2018-06-20 ENCOUNTER — Telehealth: Payer: Self-pay | Admitting: Family

## 2018-06-20 NOTE — Telephone Encounter (Signed)
COVID-19 Pre-Screening Questions: ° °Do you currently have a fever (>100 °F), chills or unexplained body aches?no  °Are you currently experiencing new cough, shortness of breath, sore throat, runny nose?no  °•  °Have you recently travelled outside the state of [place] in the last 14 days?no  °•  °1. Have you been in contact with someone that is currently pending confirmation of Covid19 testing or has been confirmed to have the Covid19 virus?  No  ° °

## 2018-06-21 ENCOUNTER — Encounter: Payer: Self-pay | Admitting: Family

## 2018-06-21 ENCOUNTER — Other Ambulatory Visit: Payer: Self-pay

## 2018-06-21 ENCOUNTER — Ambulatory Visit (INDEPENDENT_AMBULATORY_CARE_PROVIDER_SITE_OTHER): Payer: PPO | Admitting: Family

## 2018-06-21 VITALS — BP 112/72 | HR 80 | Temp 97.5°F | Wt 172.0 lb

## 2018-06-21 DIAGNOSIS — K5900 Constipation, unspecified: Secondary | ICD-10-CM | POA: Diagnosis not present

## 2018-06-21 DIAGNOSIS — B181 Chronic viral hepatitis B without delta-agent: Secondary | ICD-10-CM | POA: Diagnosis not present

## 2018-06-21 NOTE — Patient Instructions (Addendum)
Nice to meet you.  We will check your blood work today.  It sounds like you are having with constipation.  Would recommend increasing fluid intake and also activity levels as tolerated.  Can consider magnesium citrate or miralax to help with bowel movements.   Try to avoid straining.  Recommend follow up with Gastroenterology.   Please continue to take your [facility] as prescribed.  Plan for follow up in 1 month or sooner if needed with Dr. Linus Salmons.

## 2018-06-21 NOTE — Assessment & Plan Note (Signed)
Ms. Hockley has acute liver failure without hepatic coma likely related to reactivation of chronic viral hepatitis B having previously been on tenofovir and then stopping medication.  Liver function tests at discharge remain significantly elevated she has good adherence and tolerance to her tenofovir that was restarted while inpatient.  We will check blood work today to determine current status.  Continue current dose of tenofovir.

## 2018-06-21 NOTE — Assessment & Plan Note (Signed)
Brooke Huber has constipation and is refractory to Senokot.  Discussed importance of determining cause of her constipation as opposed to masking symptoms with laxatives.  Recommend magnesium citrate or MiraLAX as needed to help with bowel movements.  Most of her symptoms she is experiencing are likely related to constipation.  If symptoms worsen or do not improve recommend follow-up with gastroenterology for further evaluation and treatment options.

## 2018-06-21 NOTE — Progress Notes (Signed)
Subjective:    Patient ID: Brooke Huber, female    DOB: 1946/01/10, 73 y.o.   MRN: [id number]  Chief Complaint  Patient presents with  . Follow-up     HPI:  Brooke Huber is a 74 y.o. female with multiple medical problems including Grade II Follicular lymphoma of lymph nodes at multiple sites and followed by ID for Chronic Hepatitis B who was last seen in the office on  01/17/18 with good adherence and tolerance to Tenofovir and was recommended to stop medication with no further plans for chemotherapy.  Brooke Huber was recently admitted to the hospital with the chief complaints of nausea, lethargy, jaundice, and clay colored stools found to have acute hepatitis and reactivation of her Hepatitis B. US of the abdomen without evidence of cirrhosis and possible hepatic steatosis. She had previous problems paying for [facility] in the past. She was restarted on [place]. At discharge her LFTs appeared to be stabilizing and she was discharged on Vemlidy. At discharge her AST was 1,905; ALT 1012, Bilirubin 22.4, and platelets of 128.   Since leaving the hospital Brooke Huber has been taking her tenofovir as prescribed with no adverse side effects or missed doses.  She has been experiencing constipation and increased trouble sleeping at night.  She does on occasion a stomachache that is relieved with small bowel movements.  She also has right upper quadrant pain at times.  She is eating okay and not drinking very well currently.  Denies nausea, vomiting, fevers, chills, or sweats.  Skin remains jaundiced.  Has concerns regarding her liver function returning to normal.   Allergies  Allergen Reactions  . Penicillins Hives and Rash    Did it involve swelling of the face/tongue/throat, SOB, or low BP? N Did it involve sudden or severe rash/hives, skin peeling, or any reaction on the inside of your mouth or nose? Y Did you need to seek medical attention at a hospital or doctor's office? N  When did it last happen? If all above answers are "NO", may proceed with cephalosporin use.       Outpatient Medications Prior to Visit  Medication Sig Dispense Refill  . Calcium Carb-Cholecalciferol 600-100 MG-UNIT CAPS Take 1 tablet by mouth daily.    . Multiple Vitamin (MULTIVITAMIN WITH MINERALS) TABS tablet Take 1 tablet by mouth daily. 30 tablet 0  . ondansetron (ZOFRAN) 8 MG tablet Take 1 tablet (8 mg total) by mouth 2 (two) times daily as needed for refractory nausea / vomiting. Start on day 2 after bendamustine chemo. 30 tablet 1  . risperiDONE (RISPERDAL) 0.5 MG tablet Take 1 tablet (0.5 mg total) by mouth at bedtime. 90 tablet 0  . senna-docusate (SENOKOT-S) 8.6-50 MG tablet Take 1 tablet by mouth at bedtime as needed for mild constipation. 30 tablet 0  . Tenofovir Alafenamide Fumarate (VEMLIDY) 25 MG TABS Take 1 tablet (25 mg total) by mouth daily. 30 tablet 3  . feeding supplement, ENSURE ENLIVE, (ENSURE ENLIVE) LIQD Take 237 mLs by mouth 2 (two) times daily between meals. (Patient not taking: Reported on 06/21/2018) 237 mL 12  . LORazepam (ATIVAN) 0.5 MG tablet Take 1 tablet (0.5 mg total) by mouth every 6 (six) hours as needed (Nausea or vomiting). (Patient not taking: Reported on 06/21/2018) 30 tablet 0  . polyethylene glycol (MIRALAX / GLYCOLAX) 17 g packet Take 17 g by mouth daily. (Patient not taking: Reported on 06/21/2018) 14 each 0  . prochlorperazine (COMPAZINE) 10 MG tablet Take 1  tablet (10 mg total) by mouth every 6 (six) hours as needed (Nausea or vomiting). (Patient not taking: Reported on 06/21/2018) 30 tablet 1   Facility-Administered Medications Prior to Visit  Medication Dose Route Frequency Provider Last Rate Last Dose  . pneumococcal 13-valent conjugate vaccine (PREVNAR 13) injection 0.5 mL  0.5 mL Intramuscular Once Brunetta Genera, MD         Past Medical History:  Diagnosis Date  . 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 Cancer [facility])     breast lymphoma with mets  . Depression   . GERD (gastroesophageal reflux disease)   . Hepatitis   . Hepatitis B 02/1995  . Seizures ([place])    hx of seizure in 1993 and 1994 - caused by stress per patient      Past Surgical History:  Procedure Laterality Date  . APPENDECTOMY    . COLONOSCOPY WITH PROPOFOL N/A 12/29/2015   Procedure: COLONOSCOPY WITH PROPOFOL;  Surgeon: Garlan Fair, MD;  Location: [facility] ENDOSCOPY;  Service: Endoscopy;  Laterality: N/A;  . TONSILLECTOMY AND ADENOIDECTOMY Bilateral 1954  . TUBAL LIGATION         Review of Systems  Constitutional: Positive for appetite change. Negative for chills, fatigue, fever and unexpected weight change.  Respiratory: Negative for cough, chest tightness, shortness of breath and wheezing.   Cardiovascular: Negative for chest pain and leg swelling.  Gastrointestinal: Positive for abdominal pain and constipation. Negative for abdominal distention, diarrhea, nausea and vomiting.  Neurological: Negative for dizziness, weakness, light-headedness and headaches.  Hematological: Does not bruise/bleed easily.      Objective:    BP 112/72   Pulse 80   Temp (!) 97.5 F (36.4 C) (Oral)   Wt 172 lb (78 kg)   BMI 31.46 kg/m  Nursing note and vital signs reviewed.  Physical Exam Constitutional:      General: She is not in acute distress.    Appearance: She is well-developed. She is obese.     Comments: Seated in the chair; has anxious mood.   Eyes:     General: Scleral icterus present.  Cardiovascular:     Rate and Rhythm: Normal rate and regular rhythm.     Heart sounds: Normal heart sounds. No murmur. No friction rub. No gallop.   Pulmonary:     Effort: Pulmonary effort is normal. No respiratory distress.     Breath sounds: Normal breath sounds. No wheezing or rales.  Chest:     Chest wall: No tenderness.  Abdominal:     General: Bowel sounds are normal. There is no distension.     Palpations: Abdomen is soft. There is  no mass.     Tenderness: There is abdominal tenderness. There is no guarding or rebound.  Skin:    General: Skin is warm and dry.     Coloration: Skin is jaundiced.  Neurological:     Mental Status: She is alert and oriented to person, place, and time.        Assessment & Plan:   Problem List Items Addressed This Visit      Digestive   Chronic viral hepatitis B without delta-agent ([place]) - Primary    Brooke Huber has acute liver failure without hepatic coma likely related to reactivation of chronic viral hepatitis B having previously been on tenofovir and then stopping medication.  Liver function tests at discharge remain significantly elevated she has good adherence and tolerance to her tenofovir that was restarted while  inpatient.  We will check blood work today to determine current status.  Continue current dose of tenofovir.      Relevant Orders   Hepatitis B core antibody, IgM   Hepatitis B core antibody, total   Hepatitis B DNA, ultraquantitative, PCR   Hepatitis B e antibody   Hepatitis B e antigen   Hepatitis B surface antigen   Hepatic function panel   Basic metabolic panel     Other   Constipation    Brooke Huber has constipation and is refractory to Senokot.  Discussed importance of determining cause of her constipation as opposed to masking symptoms with laxatives.  Recommend magnesium citrate or MiraLAX as needed to help with bowel movements.  Most of her symptoms she is experiencing are likely related to constipation.  If symptoms worsen or do not improve recommend follow-up with gastroenterology for further evaluation and treatment options.          I am having Brooke Huber. Brooke Huber maintain her Calcium Carb-Cholecalciferol, prochlorperazine, ondansetron, LORazepam, risperiDONE, Tenofovir Alafenamide Fumarate, feeding supplement (ENSURE ENLIVE), multivitamin with minerals, polyethylene glycol, and senna-docusate.   Follow-up: Return in about 1 month (around  07/22/2018), or if symptoms worsen or fail to improve.   Terri Piedra, MSN, FNP-C Nurse Practitioner [facility] for Infectious Disease [place] number: [phone]

## 2018-06-22 ENCOUNTER — Telehealth: Payer: Self-pay

## 2018-06-22 NOTE — Telephone Encounter (Signed)
Patient called regarding her Sertraline. Patient was in the hospital from 06/02/18 to 06/15/18 for liver failure. While she was in the hospital, they discontinued the Sertraline at discharge. She wanted to know whether or not to start the Sertraline up again. I informed her that they discontinued it when she was discharged and not to take it per [place].

## 2018-06-26 ENCOUNTER — Telehealth: Payer: Self-pay | Admitting: Family

## 2018-06-26 LAB — HEPATITIS B E ANTIBODY: Hep B E Ab: REACTIVE — AB

## 2018-06-26 LAB — HEPATIC FUNCTION PANEL
AG Ratio: 0.6 (calc) — ABNORMAL LOW (ref 1.0–2.5)
ALT: 221 U/L — ABNORMAL HIGH (ref 6–29)
AST: 268 U/L — ABNORMAL HIGH (ref 10–35)
Albumin: 2.7 g/dL — ABNORMAL LOW (ref 3.6–5.1)
Alkaline phosphatase (APISO): 107 U/L (ref 37–153)
Bilirubin, Direct: >10 mg/dL — ABNORMAL HIGH (ref 0.0–0.2)
Globulin: 4.6 g/dL (calc) — ABNORMAL HIGH (ref 1.9–3.7)
Total Bilirubin: 25.1 mg/dL — ABNORMAL HIGH (ref 0.2–1.2)
Total Protein: 7.3 g/dL (ref 6.1–8.1)

## 2018-06-26 LAB — BASIC METABOLIC PANEL
BUN/Creatinine Ratio: 14 (calc) (ref 6–22)
BUN: 18 mg/dL (ref 7–25)
CO2: 25 mmol/L (ref 20–32)
Calcium: 8.6 mg/dL (ref 8.6–10.4)
Chloride: 102 mmol/L (ref 98–110)
Creat: 1.28 mg/dL — ABNORMAL HIGH (ref 0.60–0.93)
Glucose, Bld: 88 mg/dL (ref 65–99)
Potassium: 3.1 mmol/L — ABNORMAL LOW (ref 3.5–5.3)
Sodium: 133 mmol/L — ABNORMAL LOW (ref 135–146)

## 2018-06-26 LAB — HEPATITIS B SURFACE ANTIGEN: Hepatitis B Surface Ag: REACTIVE — AB

## 2018-06-26 LAB — HEPATITIS B E ANTIGEN: Hep B E Ag: NONREACTIVE

## 2018-06-26 LAB — HEPATITIS B DNA, ULTRAQUANTITATIVE, PCR
Hepatitis B DNA (Calc): 3.45 Log IU/mL — ABNORMAL HIGH
Hepatitis B DNA: 2810 IU/mL — ABNORMAL HIGH

## 2018-06-26 LAB — HEPATITIS B CORE ANTIBODY, IGM: Hep B C IgM: REACTIVE — AB

## 2018-06-26 LAB — HEPATITIS B CORE ANTIBODY, TOTAL: Hep B Core Total Ab: REACTIVE — AB

## 2018-06-26 NOTE — Telephone Encounter (Signed)
Spoke with Brooke Huber on the phone regarding her results and that they remain consistent with a Hepatitis B flare. She will continue to take her Tenofovir until seen by Dr. Linus Salmons in 1 month.

## 2018-06-27 DIAGNOSIS — B169 Acute hepatitis B without delta-agent and without hepatic coma: Secondary | ICD-10-CM | POA: Diagnosis not present

## 2018-07-05 DIAGNOSIS — B181 Chronic viral hepatitis B without delta-agent: Secondary | ICD-10-CM | POA: Diagnosis not present

## 2018-07-09 MED FILL — VEMLIDY 25 MG TABLET: 25 | 30 days supply | Qty: 30 | Fill #1

## 2018-07-16 DIAGNOSIS — R740 Nonspecific elevation of levels of transaminase and lactic acid dehydrogenase [LDH]: Secondary | ICD-10-CM | POA: Diagnosis not present

## 2018-07-16 DIAGNOSIS — R7989 Other specified abnormal findings of blood chemistry: Secondary | ICD-10-CM | POA: Diagnosis not present

## 2018-07-19 DIAGNOSIS — B181 Chronic viral hepatitis B without delta-agent: Secondary | ICD-10-CM | POA: Diagnosis not present

## 2018-07-19 DIAGNOSIS — F419 Anxiety disorder, unspecified: Secondary | ICD-10-CM | POA: Diagnosis not present

## 2018-07-19 DIAGNOSIS — E7800 Pure hypercholesterolemia, unspecified: Secondary | ICD-10-CM | POA: Diagnosis not present

## 2018-07-19 DIAGNOSIS — Z0000 Encounter for general adult medical examination without abnormal findings: Secondary | ICD-10-CM | POA: Diagnosis not present

## 2018-07-30 ENCOUNTER — Telehealth: Payer: Self-pay | Admitting: Internal Medicine

## 2018-07-30 NOTE — Telephone Encounter (Signed)
COVID-19 Pre-Screening Questions: ° °Do you currently have a fever (>100 °F), chills or unexplained body aches? No  ° °Are you currently experiencing new cough, shortness of breath, sore throat, runny nose? No  °•  °Have you recently travelled outside the state of [place] in the last 14 days? No  °•  °1. Have you been in contact with someone that is currently pending confirmation of Covid19 testing or has been confirmed to have the Covid19 virus?  No  ° °

## 2018-07-31 ENCOUNTER — Encounter: Payer: Self-pay | Admitting: Internal Medicine

## 2018-07-31 ENCOUNTER — Other Ambulatory Visit: Payer: Self-pay

## 2018-07-31 ENCOUNTER — Ambulatory Visit (INDEPENDENT_AMBULATORY_CARE_PROVIDER_SITE_OTHER): Payer: PPO | Admitting: Internal Medicine

## 2018-07-31 VITALS — BP 132/77 | HR 84 | Temp 98.2°F | Wt 164.0 lb

## 2018-07-31 DIAGNOSIS — Z5181 Encounter for therapeutic drug level monitoring: Secondary | ICD-10-CM

## 2018-07-31 DIAGNOSIS — B179 Acute viral hepatitis, unspecified: Secondary | ICD-10-CM | POA: Diagnosis not present

## 2018-07-31 DIAGNOSIS — B181 Chronic viral hepatitis B without delta-agent: Secondary | ICD-10-CM

## 2018-07-31 MED ORDER — VEMLIDY 25 MG PO TABS: 25.0000 mg | ORAL_TABLET | Freq: Every day | ORAL | 11 refills | Status: DC

## 2018-07-31 NOTE — Assessment & Plan Note (Signed)
Will check the creat on her medication

## 2018-07-31 NOTE — Progress Notes (Signed)
   Subjective:    Patient ID: Brooke Huber, female    DOB: 12-21-1945, 73 y.o.   MRN: [id number]  HPI Here for follow up of chronic hepatitis B She was treated for lymphoma by Dr. Irene Limbo and was on suppressive tenofovir for history of chronic carrier of hepatitis B with a low level viremia prior to and during treatment for lymphoma.  She then stopped the medication during her chemo and developed a flare of acute hepatitis B.  LFTs in the thousands and T bili around 25.  DNA was sent but no result reported from the inpatient labs.  She was put back on TAF and has continued on this.  Seen by my partner last visit after hospitalization and LFTs greatly improved.  DNA just 2,810 IU/mL.  Creat was up some.  Still taking daily.     Review of Systems  Constitutional: Positive for fatigue. Negative for unexpected weight change.  Gastrointestinal: Negative for diarrhea.  Skin: Negative for rash.       Objective:   Physical Exam Constitutional:      Appearance: Normal appearance.  Eyes:     General: No scleral icterus. Cardiovascular:     Rate and Rhythm: Normal rate and regular rhythm.     Heart sounds: No murmur.  Pulmonary:     Effort: Pulmonary effort is normal.     Breath sounds: Normal breath sounds.  Skin:    Findings: No rash.  Neurological:     Mental Status: She is alert.   SH: no alcohol        Assessment & Plan:

## 2018-07-31 NOTE — Assessment & Plan Note (Signed)
Much improved LFTs and stable T bili.   Will recheck today.

## 2018-07-31 NOTE — Assessment & Plan Note (Signed)
On TAF and will continue indefinetly.   Will check DNA level today.  RTC 3 months for recheck of labs.

## 2018-08-08 LAB — COMPLETE METABOLIC PANEL WITH GFR
AG Ratio: 0.9 (calc) — ABNORMAL LOW (ref 1.0–2.5)
ALT: 28 U/L (ref 6–29)
AST: 44 U/L — ABNORMAL HIGH (ref 10–35)
Albumin: 3.4 g/dL — ABNORMAL LOW (ref 3.6–5.1)
Alkaline phosphatase (APISO): 73 U/L (ref 37–153)
BUN/Creatinine Ratio: 12 (calc) (ref 6–22)
BUN: 13 mg/dL (ref 7–25)
CO2: 28 mmol/L (ref 20–32)
Calcium: 9.2 mg/dL (ref 8.6–10.4)
Chloride: 107 mmol/L (ref 98–110)
Creat: 1.06 mg/dL — ABNORMAL HIGH (ref 0.60–0.93)
GFR, Est African American: 60 mL/min/{1.73_m2} (ref >=60)
GFR, Est Non African American: 52 mL/min/{1.73_m2} — ABNORMAL LOW (ref >=60)
Globulin: 3.9 g/dL (calc) — ABNORMAL HIGH (ref 1.9–3.7)
Glucose, Bld: 132 mg/dL — ABNORMAL HIGH (ref 65–99)
Potassium: 3.8 mmol/L (ref 3.5–5.3)
Sodium: 142 mmol/L (ref 135–146)
Total Bilirubin: 2.5 mg/dL — ABNORMAL HIGH (ref 0.2–1.2)
Total Protein: 7.3 g/dL (ref 6.1–8.1)

## 2018-08-08 LAB — HEPATITIS B DNA, ULTRAQUANTITATIVE, PCR
Hepatitis B DNA (Calc): 1.84 Log IU/mL — ABNORMAL HIGH
Hepatitis B DNA: 68 IU/mL — ABNORMAL HIGH

## 2018-08-08 LAB — HEPATITIS DELTA ANTIBODY: Hepatitis D Ab, Total: NEGATIVE

## 2018-08-13 ENCOUNTER — Telehealth: Payer: Self-pay | Admitting: *Deleted

## 2018-08-13 NOTE — Telephone Encounter (Signed)
Patient notified and advised if she does come down with Covid she should contact her PCP for follow.

## 2018-08-13 NOTE — Telephone Encounter (Signed)
She should isolate away from him as much as possible for 14 days.

## 2018-08-13 NOTE — Telephone Encounter (Signed)
Patient called to ask what she should do as her son, whom she has been staying with since getting out of the hospital, was just called by his employer and advised that 24 co-workers have tested positive for Covid 19 and he is being sent for testing. She is very upset and wants to know what she should do. No body is in the home has any symptoms at this time she is just super worried and wants to know what she should do. Advised will ask the provider and give her a call back once he responds.  Call back [phone]

## 2018-09-10 DIAGNOSIS — R899 Unspecified abnormal finding in specimens from other organs, systems and tissues: Secondary | ICD-10-CM | POA: Diagnosis not present

## 2018-09-10 DIAGNOSIS — R740 Nonspecific elevation of levels of transaminase and lactic acid dehydrogenase [LDH]: Secondary | ICD-10-CM | POA: Diagnosis not present

## 2018-09-21 ENCOUNTER — Telehealth: Payer: Self-pay | Admitting: Hematology

## 2018-09-21 ENCOUNTER — Inpatient Hospital Stay: Payer: PPO | Attending: Hematology

## 2018-09-21 ENCOUNTER — Other Ambulatory Visit: Payer: Self-pay

## 2018-09-21 ENCOUNTER — Inpatient Hospital Stay (HOSPITAL_BASED_OUTPATIENT_CLINIC_OR_DEPARTMENT_OTHER): Payer: PPO | Admitting: Hematology

## 2018-09-21 VITALS — BP 127/65 | HR 75 | Temp 98.3°F | Resp 18 | Ht 62.0 in | Wt 168.2 lb

## 2018-09-21 DIAGNOSIS — F329 Major depressive disorder, single episode, unspecified: Secondary | ICD-10-CM | POA: Insufficient documentation

## 2018-09-21 DIAGNOSIS — M8580 Other specified disorders of bone density and structure, unspecified site: Secondary | ICD-10-CM | POA: Diagnosis not present

## 2018-09-21 DIAGNOSIS — F419 Anxiety disorder, unspecified: Secondary | ICD-10-CM | POA: Insufficient documentation

## 2018-09-21 DIAGNOSIS — B1910 Unspecified viral hepatitis B without hepatic coma: Secondary | ICD-10-CM | POA: Insufficient documentation

## 2018-09-21 DIAGNOSIS — K5900 Constipation, unspecified: Secondary | ICD-10-CM | POA: Diagnosis not present

## 2018-09-21 DIAGNOSIS — D696 Thrombocytopenia, unspecified: Secondary | ICD-10-CM | POA: Diagnosis not present

## 2018-09-21 DIAGNOSIS — C8218 Follicular lymphoma grade II, lymph nodes of multiple sites: Secondary | ICD-10-CM

## 2018-09-21 DIAGNOSIS — C8210 Follicular lymphoma grade II, unspecified site: Secondary | ICD-10-CM | POA: Insufficient documentation

## 2018-09-21 DIAGNOSIS — Z79899 Other long term (current) drug therapy: Secondary | ICD-10-CM | POA: Insufficient documentation

## 2018-09-21 LAB — CBC WITH DIFFERENTIAL/PLATELET
Abs Immature Granulocytes: 0.01 10*3/uL (ref 0.00–0.07)
Basophils Absolute: 0 10*3/uL (ref 0.0–0.1)
Basophils Relative: 1 %
Eosinophils Absolute: 0.1 10*3/uL (ref 0.0–0.5)
Eosinophils Relative: 3 %
HCT: 41.2 % (ref 36.0–46.0)
Hemoglobin: 13.9 g/dL (ref 12.0–15.0)
Immature Granulocytes: 0 %
Lymphocytes Relative: 24 %
Lymphs Abs: 0.8 10*3/uL (ref 0.7–4.0)
MCH: 33.2 pg (ref 26.0–34.0)
MCHC: 33.7 g/dL (ref 30.0–36.0)
MCV: 98.3 fL (ref 80.0–100.0)
Monocytes Absolute: 0.4 10*3/uL (ref 0.1–1.0)
Monocytes Relative: 13 %
Neutro Abs: 2 10*3/uL (ref 1.7–7.7)
Neutrophils Relative %: 59 %
Platelets: 106 10*3/uL — ABNORMAL LOW (ref 150–400)
RBC: 4.19 MIL/uL (ref 3.87–5.11)
RDW: 11.7 % (ref 11.5–15.5)
WBC: 3.4 10*3/uL — ABNORMAL LOW (ref 4.0–10.5)
nRBC: 0 % (ref 0.0–0.2)

## 2018-09-21 LAB — LACTATE DEHYDROGENASE: LDH: 138 U/L (ref 98–192)

## 2018-09-21 LAB — CMP (CANCER CENTER ONLY)
ALT: 21 U/L (ref 0–44)
AST: 28 U/L (ref 15–41)
Albumin: 3.7 g/dL (ref 3.5–5.0)
Alkaline Phosphatase: 69 U/L (ref 38–126)
Anion gap: 9 (ref 5–15)
BUN: 15 mg/dL (ref 8–23)
CO2: 25 mmol/L (ref 22–32)
Calcium: 9.7 mg/dL (ref 8.9–10.3)
Chloride: 106 mmol/L (ref 98–111)
Creatinine: 1.16 mg/dL — ABNORMAL HIGH (ref 0.44–1.00)
GFR, Est AFR Am: 54 mL/min — ABNORMAL LOW (ref >60)
GFR, Estimated: 47 mL/min — ABNORMAL LOW (ref >60)
Glucose, Bld: 111 mg/dL — ABNORMAL HIGH (ref 70–99)
Potassium: 3.9 mmol/L (ref 3.5–5.1)
Sodium: 140 mmol/L (ref 135–145)
Total Bilirubin: 0.9 mg/dL (ref 0.3–1.2)
Total Protein: 7.3 g/dL (ref 6.5–8.1)

## 2018-09-21 NOTE — Telephone Encounter (Signed)
Gave avs and claendar

## 2018-09-21 NOTE — Progress Notes (Signed)
HEMATOLOGY/ONCOLOGY CLINIC NOTE  Date of Service:  09/21/18    Patient Care Team: Alroy Dust, L.Marlou Sa, MD as PCP - General (Family Medicine)  CHIEF COMPLAINTS/PURPOSE OF CONSULTATION:  F/u for mx of  Follicular Lymphoma  HISTORY OF PRESENTING ILLNESS:    Brooke Huber is a wonderful 73 y.o. female who has been referred to [name] by her PCP, Dr. 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 She presents to the clinic today accompanied by her friend.   She had a yearly screening mammogram on 04/17/17 which found her to have an enlarged axillary lymph nodes. She had a biopsy on 04/28/17 which showed evidence of lymphoma.   Today she notes she feels fine in general with no changes over the last 6 months. She hopes to go to the beach on the 18th of this month with her friend for 5 days.   She notes years ago in the late 1990s she went to donate blood and they told her she had Hep B, but never had liver damage/change or concerning symptoms. She never had been out of the country, tattoo or blood transfusion. She denies any other major comorbidities. She sees a psychiatrist every 4 months for her depression and anxiety. She notes diagnosis like this can trigger her depression but she is working to maintain her mood during this time.   On review of symptoms, pt notes limited ROM of her left arm for the last 2 years with tenderness. She notes arthritis in her hands and possibly in her shoulder. She denies bowel issues or trouble with bowel movements or passing urine.  Interval History:  Brooke Huber returns today regarding her low grade Follicular Lymphoma after completing BR treatment. The patient's last visit with [name] was on 05/23/2018. The pt reports that she is doing well overall.  The pt reports occasional constipation that she treats with laxatives. She is up to date with Pneumonia vaccines via Dr. Alroy Dust.   Of note since the patient's last visit, pt has had  Abdominal US completed on 06/02/2018 with results revealing "1. Gallbladder not visualized likely secondary to a contracted State. 2. Splenomegaly. 3. Increased hepatic echogenicity as can be seen with hepatic Steatosis. 4. Ectatic abdominal aorta, at risk for aneurysm development."  Lab results today (09/21/18) of CBC w/diff and CMP is as follows: all values are WNL except for WBC at 3.4K, Platelets at 106, Glucose at 111, Creatinine at 1.16, GRF Est non Af Am at 47. 09/21/2018 LDH at 138.   On review of systems, pt denies new lumps/bumps, abdominal pain and any other symptoms.    MEDICAL HISTORY:  Past Medical History:  Diagnosis Date  . 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 Cancer [facility])    breast lymphoma with mets  . Depression   . GERD (gastroesophageal reflux disease)   . Hepatitis   . Hepatitis B 02/1995  . Seizures ([place])    hx of seizure in 1993 and 1994 - caused by stress per patient   -Diverticulitis  -Osteopenia  -Acute gastritis   SURGICAL HISTORY: Past Surgical History:  Procedure Laterality Date  . APPENDECTOMY    . COLONOSCOPY WITH PROPOFOL N/A 12/29/2015   Procedure: COLONOSCOPY WITH PROPOFOL;  Surgeon: Garlan Fair, MD;  Location: [facility] ENDOSCOPY;  Service: Endoscopy;  Laterality: N/A;  . TONSILLECTOMY AND ADENOIDECTOMY Bilateral 1954  . TUBAL LIGATION      SOCIAL HISTORY: Social History   Socioeconomic History  .  Marital status: Single    Spouse name: Not on file  . Number of children: Not on file  . Years of education: Not on file  . Highest education level: Not on file  Occupational History  . Not on file  Social Needs  . Financial resource strain: Not on file  . Food insecurity    Worry: Not on file    Inability: Not on file  . Transportation needs    Medical: Not on file    Non-medical: Not on file  Tobacco Use  . Smoking status: Never Smoker  . Smokeless tobacco: Never Used  Substance and Sexual Activity  . Alcohol use: No  .  Drug use: No  . Sexual activity: Never    Birth control/protection: None  Lifestyle  . Physical activity    Days per week: Not on file    Minutes per session: Not on file  . Stress: Not on file  Relationships  . Social Herbalist on phone: Not on file    Gets together: Not on file    Attends religious service: Not on file    Active member of club or organization: Not on file    Attends meetings of clubs or organizations: Not on file    Relationship status: Not on file  . Intimate partner violence    Fear of current or ex partner: Not on file    Emotionally abused: Not on file    Physically abused: Not on file    Forced sexual activity: Not on file  Other Topics Concern  . Not on file  Social History Narrative  . Not on file    FAMILY HISTORY: Family History  Problem Relation Age of Onset  . Alzheimer's disease Sister   . Dementia Sister   . Sexual abuse Sister   . Seizures Sister   . Alcohol abuse Brother   . Alcohol abuse Maternal Uncle   . Drug abuse Cousin   . Drug abuse Son   . Depression Neg Hx     ALLERGIES:  is allergic to penicillins.  MEDICATIONS:  Current Outpatient Medications  Medication Sig Dispense Refill  . Calcium Carb-Cholecalciferol 600-100 MG-UNIT CAPS Take 1 tablet by mouth daily.    . famotidine (PEPCID) 20 MG tablet Take 20 mg by mouth 2 (two) times daily.    . feeding supplement, ENSURE ENLIVE, (ENSURE ENLIVE) LIQD Take 237 mLs by mouth 2 (two) times daily between meals. (Patient not taking: Reported on 06/21/2018) 237 mL 12  . LORazepam (ATIVAN) 0.5 MG tablet Take 1 tablet (0.5 mg total) by mouth every 6 (six) hours as needed (Nausea or vomiting). (Patient not taking: Reported on 06/21/2018) 30 tablet 0  . Multiple Vitamin (MULTIVITAMIN WITH MINERALS) TABS tablet Take 1 tablet by mouth daily. 30 tablet 0  . ondansetron (ZOFRAN) 8 MG tablet Take 1 tablet (8 mg total) by mouth 2 (two) times daily as needed for refractory nausea /  vomiting. Start on day 2 after bendamustine chemo. (Patient not taking: Reported on 07/31/2018) 30 tablet 1  . polyethylene glycol (MIRALAX / GLYCOLAX) 17 g packet Take 17 g by mouth daily. (Patient not taking: Reported on 06/21/2018) 14 each 0  . prochlorperazine (COMPAZINE) 10 MG tablet Take 1 tablet (10 mg total) by mouth every 6 (six) hours as needed (Nausea or vomiting). (Patient not taking: Reported on 06/21/2018) 30 tablet 1  . risperiDONE (RISPERDAL) 0.5 MG tablet Take 1 tablet (0.5 mg total) by  mouth at bedtime. (Patient not taking: Reported on 07/31/2018) 90 tablet 0  . senna-docusate (SENOKOT-S) 8.6-50 MG tablet Take 1 tablet by mouth at bedtime as needed for mild constipation. 30 tablet 0  . Tenofovir Alafenamide Fumarate (VEMLIDY) 25 MG TABS Take 1 tablet (25 mg total) by mouth daily. 30 tablet 11   No current facility-administered medications for this visit.    Facility-Administered Medications Ordered in Other Visits  Medication Dose Route Frequency Provider Last Rate Last Dose  . pneumococcal 13-valent conjugate vaccine (PREVNAR 13) injection 0.5 mL  0.5 mL Intramuscular Once Irene Limbo, Cloria Spring, MD        REVIEW OF SYSTEMS:    A 10+ POINT REVIEW OF SYSTEMS WAS OBTAINED including neurology, dermatology, psychiatry, cardiac, respiratory, lymph, extremities, GI, GU, Musculoskeletal, constitutional, breasts, reproductive, HEENT.  All pertinent positives are noted in the HPI.  All others are negative.   PHYSICAL EXAMINATION: ECOG PERFORMANCE STATUS: 1 - Symptomatic but completely ambulatory  Vitals:   09/21/18 1043  BP: 127/65  Pulse: 75  Resp: 18  Temp: 98.3 F (36.8 C)  SpO2: 99%   Filed Weights   09/21/18 1043  Weight: 168 lb 3.2 oz (76.3 kg)   .Body mass index is 30.76 kg/m.   GENERAL:alert, in no acute distress and comfortable SKIN: no acute rashes, no significant lesions EYES: conjunctiva are pink and non-injected, sclera anicteric OROPHARYNX: MMM, no exudates,  no oropharyngeal erythema or ulceration NECK: supple, no JVD LYMPH:  no palpable lymphadenopathy in the cervical, axillary or inguinal regions LUNGS: clear to auscultation b/l with normal respiratory effort HEART: regular rate & rhythm ABDOMEN:  normoactive bowel sounds , non tender, not distended. No palpable hepatosplenomegaly.  Extremity: no pedal edema PSYCH: alert & oriented x 3 with fluent speech NEURO: no focal motor/sensory deficits   LABORATORY DATA:  I have reviewed the data as listed  . CBC Latest Ref Rng & Units 09/21/2018 06/12/2018 06/11/2018  WBC 4.0 - 10.5 K/uL 3.4(L) 4.7 6.3  Hemoglobin 12.0 - 15.0 g/dL 13.9 10.2(L) 10.8(L)  Hematocrit 36.0 - 46.0 % 41.2 31.4(L) 31.6(L)  Platelets 150 - 400 K/uL 106(L) 128(L) 127(L)   ANC 200 . CMP Latest Ref Rng & Units 09/21/2018 07/31/2018 06/21/2018  Glucose 70 - 99 mg/dL 111(H) 132(H) 88  BUN 8 - 23 mg/dL 15 13 18   Creatinine 0.44 - 1.00 mg/dL 1.16(H) 1.06(H) 1.28(H)  Sodium 135 - 145 mmol/L 140 142 133(L)  Potassium 3.5 - 5.1 mmol/L 3.9 3.8 3.1(L)  Chloride 98 - 111 mmol/L 106 107 102  CO2 22 - 32 mmol/L 25 28 25   Calcium 8.9 - 10.3 mg/dL 9.7 9.2 8.6  Total Protein 6.5 - 8.1 g/dL 7.3 7.3 7.3  Total Bilirubin 0.3 - 1.2 mg/dL 0.9 2.5(H) 25.1(H)  Alkaline Phos 38 - 126 U/L 69 - -  AST 15 - 41 U/L 28 44(H) 268(H)  ALT 0 - 44 U/L 21 28 221(H)   Component     Latest Ref Rng & Units 05/02/2017 06/29/2017  HBV DNA SERPL PCR-ACNC     IU/mL 290 HBV DNA not detected  HBV DNA SERPL PCR-LOG IU     log10 IU/mL 2.462 UNABLE TO CALCULATE  Test Info:      Comment Comment   Component     Latest Ref Rng & Units 07/12/2017  HBV DNA SERPL PCR-ACNC     IU/mL HBV DNA not detected  HBV DNA SERPL PCR-LOG IU     log10 IU/mL UNABLE TO CALCULATE  Test Info:      Comment   Component     Latest Ref Rng & Units 05/02/2017  HBV DNA SERPL PCR-ACNC     IU/mL 290  HBV DNA SERPL PCR-LOG IU     log10 IU/mL 2.462  Test Info:      Comment  Hep B  Core Ab, IgM     Negative Negative  Hep B Core Ab, Tot     Negative Positive (A)  Hepatitis B Surface Ag     Negative Confirm. indicated  HCV Ab     0.0 - 0.9 s/co ratio <0.1  HBsAg Conf      Positive (A)    PATHOLOGY    Diagnosis 04/25/17  Lymph node, needle/core biopsy, left - FOLLICULAR LYMPHOMA, GRADE 1-2. - SEE ONCOLOGY TABLE. Microscopic Comment LYMPHOMA Histologic type: Non-Hodgkin B-cell lymphoma: Follicular lymphoma. Grade (if applicable): Low grade (grade 1-2). Flow cytometry: N/A. Immunohistochemical stains: CD20, CD3, CD5, CD10, CD23, CD43, bcl-2, bcl-6, CD138, light chains, Ki-67. Touch preps/imprints: N/A. Comments: Core biopsies reveal effacement by a vaguely nodular infiltrate of atypical lymphocytes. The lymphocytes are predominately small with irregular nuclear contours. There are no diffuse areas of large cells. Immunohistochemistry reveals abundant CD20-positive B-cells. CD3, CD43, and CD5 highlight interfollicular T-cells. Bcl-6 and CD23 reveal numerous small follicles with bcl-6 positive cells extending outside the follicular dendritic networks. The majority of these cells are bcl-2 positive. CD10 reveals only a few small residual germinal center foci. CD138 highlights scattered plasma cells which are polytypic by light chain in situ hybridization. The overall findings are most consistent with a low grade follicular lymphoma. Dr. Isaiah Blakes was notified on 04/27/2017.   RADIOGRAPHIC STUDIES: I have personally reviewed the radiological images as listed and agreed with the findings in the report. No results found.  Screening Mammogram 04/17/17 IMPRESSION:  The New enlarged nodes in both axillae are indeterminate. An [name] is recommended.    ASSESSMENT & PLAN:   Brooke Huber is a 73 y.o. Caucasian female with   1. 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  10/18/17 PET/CT revealed No measurable metabolic activity within previously enlarged cervical, axillary, mediastinal, and abdominal and pelvic lymph nodes ( [place] 1). 2. Findings support complete response to therapy. 3. Spleen is normal size with normal metabolic activity. 4. No evidence of marrow involvement.   The pt reached complete metabolic response after three cycles of Bendamustine/Rituxan and 2 cycles of only Rituxan (due to neutropenia)  05/16/18 CT C/A/P revealed No residual or recurrent lymphadenopathy in the chest, abdomen, or pelvis. 2. Mild splenomegaly, maximum span 13.2 cm, significantly decreased in size compared to prior examinations. 3. Stable incidental 7 mm ground-glass opacity of the right upper lobe (series 6, image 54). Attention on follow-up.  2. Mild thrombocytopenia-- likely from lymphoma. ? BM involvement vs Immune thrombocytopenia.+ chemotherapy PLT improved to 42k   3. Chronic active Hepatitis B -Hepatitis B labs from 05/02/17 revealed chronic active disease which will be a limiting factor in her treatment.  -Pt completed Tenofovir AF with ID, Dr Linus Salmons -Tenofovir has successfully suppressed her Hep B virus as of 06/29/17, 07/24/2017, 10/17/2017 and 01/17/18 labs  PLAN:  -Discussed pt labwork today, 09/21/18; all values are  WNL except for WBC at 3.4K, Platelets at 106, Glucose at 111, Creatinine at 1.16, GRF Est non Af Am at 47. -Discussed 09/21/2018 LDH at 138.  -The pt shows no clinical, radiographic, or lab progression of her follicular lymphoma at this time. We will continue to monitor.  -Do not consider maintenance Rituxan to be appropriate for this pt considering risk posed by concern for possible Hep B reactivation -Avoid  NSAIDs. Tylenol is okay for mild pains.  -Continue follow up with ID for Hep B management -Recommend pt obtain annual flu vaccine -Will see back in 4 months with labs    RTC with Dr Irene Limbo with labs in 4 months    All of the patients questions were answered with apparent satisfaction. The patient knows to call the clinic with any problems, questions or concerns.  The total time spent in the appt was 20 minutes and more than 50% was on counseling and direct patient cares.    Sullivan Lone MD [place] AAHIVMS [facility] Hematology/Oncology Physician [facility]  (Office):       [phone] (Work cell):  [phone] (Fax):           [phone]  09/21/2018 2:20 PM  I, Yevette Edwards, am acting as a scribe for Dr. Sullivan Lone.   .I have reviewed the above documentation for accuracy and completeness, and I agree with the above. Brunetta Genera MD

## 2018-11-07 ENCOUNTER — Other Ambulatory Visit: Payer: Self-pay

## 2018-11-07 ENCOUNTER — Encounter: Payer: Self-pay | Admitting: Internal Medicine

## 2018-11-07 ENCOUNTER — Ambulatory Visit (INDEPENDENT_AMBULATORY_CARE_PROVIDER_SITE_OTHER): Payer: PPO | Admitting: Internal Medicine

## 2018-11-07 VITALS — BP 121/77 | HR 80 | Temp 97.8°F

## 2018-11-07 DIAGNOSIS — B181 Chronic viral hepatitis B without delta-agent: Secondary | ICD-10-CM | POA: Diagnosis not present

## 2018-11-07 DIAGNOSIS — Z9189 Other specified personal risk factors, not elsewhere classified: Secondary | ICD-10-CM | POA: Insufficient documentation

## 2018-11-07 DIAGNOSIS — Z5181 Encounter for therapeutic drug level monitoring: Secondary | ICD-10-CM | POA: Diagnosis not present

## 2018-11-07 NOTE — Progress Notes (Signed)
   Subjective:    Patient ID: Brooke Huber, female    DOB: [birth year], 73 y.o.   MRN: [id number]  HPI Here for follow up of chronic hepatitis B She was treated for lymphoma by Dr. Irene Limbo and was on suppressive tenofovir for history of chronic carrier of hepatitis B with a low level viremia prior to and during treatment for lymphoma.  She then stopped the medication during her chemo and developed a flare of acute hepatitis B.  LFTs in the thousands and T bili around 25.  DNA was sent but no result reported from the inpatient labs.  She was put back on TAF and has continued on this.   She is here for follow up and no new issues.  No missed doses on the TAF and no associated rash or diarrhea.  No labs prior to visit.  No associated fatigue, weight loss.    Review of Systems  Constitutional: Negative for fatigue and unexpected weight change.  Gastrointestinal: Negative for diarrhea.  Skin: Negative for rash.       Objective:   Physical Exam Constitutional:      Appearance: Normal appearance.  Eyes:     General: No scleral icterus. Cardiovascular:     Rate and Rhythm: Normal rate and regular rhythm.     Heart sounds: No murmur.  Pulmonary:     Effort: Pulmonary effort is normal.     Breath sounds: Normal breath sounds.  Skin:    Findings: No rash.  Neurological:     Mental Status: She is alert.  Psychiatric:        Mood and Affect: Mood normal.   SH: no alcohol        Assessment & Plan:

## 2018-11-08 NOTE — Assessment & Plan Note (Signed)
She is doing well on TAF and no changes.  Labs today and she can rtc in 6 months.

## 2018-11-08 NOTE — Assessment & Plan Note (Signed)
Will do [place] screening with ultrasound and continue every 6 months.

## 2018-11-08 NOTE — Assessment & Plan Note (Signed)
Will monitor her creat today

## 2018-11-12 LAB — COMPLETE METABOLIC PANEL WITH GFR
AG Ratio: 1.6 (calc) (ref 1.0–2.5)
ALT: 23 U/L (ref 6–29)
AST: 28 U/L (ref 10–35)
Albumin: 4.2 g/dL (ref 3.6–5.1)
Alkaline phosphatase (APISO): 63 U/L (ref 37–153)
BUN/Creatinine Ratio: 16 (calc) (ref 6–22)
BUN: 16 mg/dL (ref 7–25)
CO2: 28 mmol/L (ref 20–32)
Calcium: 9.7 mg/dL (ref 8.6–10.4)
Chloride: 107 mmol/L (ref 98–110)
Creat: 1.03 mg/dL — ABNORMAL HIGH (ref 0.60–0.93)
GFR, Est African American: 62 mL/min/{1.73_m2} (ref >=60)
GFR, Est Non African American: 54 mL/min/{1.73_m2} — ABNORMAL LOW (ref >=60)
Globulin: 2.6 g/dL (calc) (ref 1.9–3.7)
Glucose, Bld: 110 mg/dL — ABNORMAL HIGH (ref 65–99)
Potassium: 4 mmol/L (ref 3.5–5.3)
Sodium: 141 mmol/L (ref 135–146)
Total Bilirubin: 0.7 mg/dL (ref 0.2–1.2)
Total Protein: 6.8 g/dL (ref 6.1–8.1)

## 2018-11-12 LAB — HEPATITIS B DNA, ULTRAQUANTITATIVE, PCR
Hepatitis B DNA (Calc): <1 Log IU/mL — ABNORMAL HIGH
Hepatitis B DNA: <10 IU/mL — ABNORMAL HIGH

## 2018-11-13 ENCOUNTER — Ambulatory Visit
Admission: RE | Admit: 2018-11-13 | Discharge: 2018-11-13 | Disposition: A | Discharge status: Home / Self Care | Payer: PPO | Source: Ambulatory Visit | Attending: Internal Medicine | Admitting: Internal Medicine

## 2018-11-13 DIAGNOSIS — K7689 Other specified diseases of liver: Secondary | ICD-10-CM | POA: Diagnosis not present

## 2018-11-13 DIAGNOSIS — B181 Chronic viral hepatitis B without delta-agent: Secondary | ICD-10-CM

## 2018-11-13 DIAGNOSIS — K8020 Calculus of gallbladder without cholecystitis without obstruction: Secondary | ICD-10-CM | POA: Diagnosis not present

## 2018-12-13 DIAGNOSIS — Z20828 Contact with and (suspected) exposure to other viral communicable diseases: Secondary | ICD-10-CM | POA: Diagnosis not present

## 2019-01-03 ENCOUNTER — Ambulatory Visit (INDEPENDENT_AMBULATORY_CARE_PROVIDER_SITE_OTHER): Payer: PPO | Admitting: Psychiatry

## 2019-01-03 ENCOUNTER — Other Ambulatory Visit: Payer: Self-pay

## 2019-01-03 ENCOUNTER — Encounter (HOSPITAL_COMMUNITY): Payer: Self-pay | Admitting: Psychiatry

## 2019-01-03 DIAGNOSIS — F5105 Insomnia due to other mental disorder: Secondary | ICD-10-CM

## 2019-01-03 DIAGNOSIS — F99 Mental disorder, not otherwise specified: Secondary | ICD-10-CM | POA: Diagnosis not present

## 2019-01-03 DIAGNOSIS — F4310 Post-traumatic stress disorder, unspecified: Secondary | ICD-10-CM | POA: Diagnosis not present

## 2019-01-03 DIAGNOSIS — F411 Generalized anxiety disorder: Secondary | ICD-10-CM

## 2019-01-03 DIAGNOSIS — F333 Major depressive disorder, recurrent, severe with psychotic symptoms: Secondary | ICD-10-CM

## 2019-01-03 MED ORDER — SERTRALINE HCL 100 MG PO TABS: 100.0000 mg | ORAL_TABLET | Freq: Every day | ORAL | 0 refills | Status: DC

## 2019-01-03 MED ORDER — RISPERIDONE 1 MG PO TABS: 1.0000 mg | ORAL_TABLET | Freq: Every day | ORAL | 0 refills | Status: DC

## 2019-01-03 NOTE — Progress Notes (Signed)
Virtual Visit via Telephone Note  I connected with Brooke Huber  on 01/03/19 at 10:15 AM EST by telephone and verified that I am speaking with the correct person using two identifiers.  Location: Patient: home Provider: office   I discussed the limitations, risks, security and privacy concerns of performing an evaluation and management service by telephone and the availability of in person appointments. I also discussed with the patient that there may be a patient responsible charge related to this service. The patient expressed understanding and agreed to proceed.   History of Present Illness: I need your help. I have 5 concerns that I need to talk to you about. 1. My sons both have noticed a change in my mood and told me I need to get help 2. I worry about everything. I am on a fixed income and I worry.  3. I don't have patience and I want things fixed now. I get a burning feeling in my stomach, figit and shaking and I can't stop for a while. That scares me. I just want it to stop.  -it seem to be stressed induced panic attacks because they occur when she is stressed 4. I do not sleep at night. I toss and turn. I can't stop thinking and worrying. The Risperdal is not helping anymore. Please can you give me something to help me sleep. 5. I called the behavioral outpatient clinic and left a voice message. She felt she couldn't wait 24-48 hrs so she called her PCP who called in a Sertraline 50mg . It doesn't seem like it is helping with my anxiety. She started a few weeks ago. I just need medicine to help me take things one day at a time.   Her depression is worsening due to her high anxiety. She tries to walk outside, watch tv, do word finds to distract herself. It helps for a little while but not at night. She denies SI/HI/AVH. Her sons note that she is not joking or laughing. She is isolating. When she does talk it is related to her anxieties. She denies HV. COVID is getting to her  because she can't go out much or do much. When it happens she will call a friend or family member and that helps.     Observations/Objective: I spoke with Brooke Huber on the phone.  Pt was  Cooperative but obviously distressed .  Pt was engaged in the conversation and answered questions appropriately.  Speech was clear and coherent with normal rate and volume. Tone was anxious.  Mood is depressed and anxious, affect is congruent. Thought processes are coherent, goal oriented and intact.  Thought content is with ruminations.  Pt denies SI/HI.   Pt denies auditory and visual hallucinations and did not appear to be responding to internal stimuli.  Memory and concentration are good.  Fund of knowledge and use of language are average.  Insight and judgment are fair.  I am unable to comment on psychomotor activity, general appearance, hygiene, or eye contact as I was unable to physically see the patient on the phone.  Vital signs not available since interview conducted virtually.     Assessment and Plan: GAD; Insomnia; MDD-severe with psychotic features; Panic disorder without agoraphobia; PTSD  Status of current symptoms: worsening anxiety and depression  Increase Zoloft 100mg  po qD Increase Risperdal 1mg  po qHS  Follow Up Instructions: In 6 weeks or sooner if needed   I discussed the assessment and treatment plan with the patient.  The patient was provided an opportunity to ask questions and all were answered. The patient agreed with the plan and demonstrated an understanding of the instructions.   The patient was advised to call back or seek an in-person evaluation if the symptoms worsen or if the condition fails to improve as anticipated.  I provided 30 minutes of non-face-to-face time during this encounter.   Charlcie Cradle, MD

## 2019-01-21 NOTE — Progress Notes (Signed)
HEMATOLOGY/ONCOLOGY CLINIC NOTE  Date of Service:  01/22/19    Patient Care Team: Alroy Dust, L.Marlou Sa, MD as PCP - General (Family Medicine)  CHIEF COMPLAINTS/PURPOSE OF CONSULTATION:  F/u for mx of  Follicular Lymphoma  HISTORY OF PRESENTING ILLNESS:    Brooke Huber is a wonderful 73 y.o. female who has been referred to [name] by her PCP, Dr. 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 She presents to the clinic today accompanied by her friend.   She had a yearly screening mammogram on 04/17/17 which found her to have an enlarged axillary lymph nodes. She had a biopsy on 04/28/17 which showed evidence of lymphoma.   Today she notes she feels fine in general with no changes over the last 6 months. She hopes to go to the beach on the 18th of this month with her friend for 5 days.   She notes years ago in the late 1990s she went to donate blood and they told her she had Hep B, but never had liver damage/change or concerning symptoms. She never had been out of the country, tattoo or blood transfusion. She denies any other major comorbidities. She sees a psychiatrist every 4 months for her depression and anxiety. She notes diagnosis like this can trigger her depression but she is working to maintain her mood during this time.   On review of symptoms, pt notes limited ROM of her left arm for the last 2 years with tenderness. She notes arthritis in her hands and possibly in her shoulder. She denies bowel issues or trouble with bowel movements or passing urine.  Interval History: Brooke Huber returns today regarding her low grade Follicular Lymphoma after completing BR treatment. The patient's last visit with [name] was on 09/21/2018. The pt reports that she is doing well overall.  The pt reports that they have increased the dose of Zoloft from 50 mg to 100 mg, as well as one of her sleep medications. She feels that the increased dose of these medications have  contributed to improved life quality. Pt has continued to follow-up with Dr. Linus Salmons for the treatment of her Hepatitis B.   Lab results today (01/22/19) of CBC w/diff and CMP is as follows: all values are WNL except for PLT at 126K, Lymphs Abs at 0.6K, Glucose at 123, Creatinine at 1.19, Total Protein at 6.4, GFR Est Non Af Am at 45. 01/22/2019 LDH at 192  On review of systems, pt denies fevers, chills, night sweats, new bone pain, rashes, unexpected weight loss, abdominal pain, leg swelling and any other symptoms.   MEDICAL HISTORY:  Past Medical History:  Diagnosis Date  . 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 Cancer [facility])    breast lymphoma with mets  . Depression   . GERD (gastroesophageal reflux disease)   . Hepatitis   . Hepatitis B 02/1995  . Seizures ([place])    hx of seizure in 1993 and 1994 - caused by stress per patient   -Diverticulitis  -Osteopenia  -Acute gastritis   SURGICAL HISTORY: Past Surgical History:  Procedure Laterality Date  . APPENDECTOMY    . COLONOSCOPY WITH PROPOFOL N/A 12/29/2015   Procedure: COLONOSCOPY WITH PROPOFOL;  Surgeon: Garlan Fair, MD;  Location: [facility];  Service: Endoscopy;  Laterality: N/A;  . TONSILLECTOMY AND ADENOIDECTOMY Bilateral 1954  . TUBAL LIGATION      SOCIAL HISTORY: Social History   Socioeconomic History  . Marital status: Single  Spouse name: Not on file  . Number of children: Not on file  . Years of education: Not on file  . Highest education level: Not on file  Occupational History  . Not on file  Tobacco Use  . Smoking status: Never Smoker  . Smokeless tobacco: Never Used  Substance and Sexual Activity  . Alcohol use: No  . Drug use: No  . Sexual activity: Never    Birth control/protection: None  Other Topics Concern  . Not on file  Social History Narrative  . Not on file   Social Determinants of Health   Financial Resource Strain:   . Difficulty of Paying Living Expenses: Not on file    Food Insecurity:   . Worried About Charity fundraiser in the Last Year: Not on file  . Ran Out of Food in the Last Year: Not on file  Transportation Needs:   . Lack of Transportation (Medical): Not on file  . Lack of Transportation (Non-Medical): Not on file  Physical Activity:   . Days of Exercise per Week: Not on file  . Minutes of Exercise per Session: Not on file  Stress:   . Feeling of Stress : Not on file  Social Connections:   . Frequency of Communication with Friends and Family: Not on file  . Frequency of Social Gatherings with Friends and Family: Not on file  . Attends Religious Services: Not on file  . Active Member of Clubs or Organizations: Not on file  . Attends Archivist Meetings: Not on file  . Marital Status: Not on file  Intimate Partner Violence:   . Fear of Current or Ex-Partner: Not on file  . Emotionally Abused: Not on file  . Physically Abused: Not on file  . Sexually Abused: Not on file    FAMILY HISTORY: Family History  Problem Relation Age of Onset  . Alzheimer's disease Sister   . Dementia Sister   . Sexual abuse Sister   . Seizures Sister   . Alcohol abuse Brother   . Alcohol abuse Maternal Uncle   . Drug abuse Cousin   . Drug abuse Son   . Depression Neg Hx     ALLERGIES:  is allergic to penicillins.  MEDICATIONS:  Current Outpatient Medications  Medication Sig Dispense Refill  . Calcium Carb-Cholecalciferol 600-100 MG-UNIT CAPS Take 1 tablet by mouth daily.    . Cholecalciferol (VITAMIN D-400 PO) Take by mouth. 1x/ day    . famotidine (PEPCID) 20 MG tablet Take 20 mg by mouth 2 (two) times daily.    . Multiple Vitamin (MULTIVITAMIN WITH MINERALS) TABS tablet Take 1 tablet by mouth daily. 30 tablet 0  . polyethylene glycol (MIRALAX / GLYCOLAX) 17 g packet Take 17 g by mouth daily. 14 each 0  . potassium chloride SA (KLOR-CON) 20 MEQ tablet TK 1 T PO QD WF    . risperiDONE (RISPERDAL) 1 MG tablet Take 1 tablet (1 mg total) by  mouth at bedtime. 90 tablet 0  . senna-docusate (SENOKOT-S) 8.6-50 MG tablet Take 1 tablet by mouth at bedtime as needed for mild constipation. 30 tablet 0  . sertraline (ZOLOFT) 100 MG tablet Take 1 tablet (100 mg total) by mouth daily. 90 tablet 0  . Tenofovir Alafenamide Fumarate (VEMLIDY) 25 MG TABS Take 1 tablet (25 mg total) by mouth daily. 30 tablet 11   No current facility-administered medications for this visit.   Facility-Administered Medications Ordered in Other Visits  Medication Dose  Route Frequency Provider Last Rate Last Admin  . pneumococcal 13-valent conjugate vaccine (PREVNAR 13) injection 0.5 mL  0.5 mL Intramuscular Once Irene Limbo, Cloria Spring, MD        REVIEW OF SYSTEMS:   A 10+ POINT REVIEW OF SYSTEMS WAS OBTAINED including neurology, dermatology, psychiatry, cardiac, respiratory, lymph, extremities, GI, GU, Musculoskeletal, constitutional, breasts, reproductive, HEENT.  All pertinent positives are noted in the HPI.  All others are negative.   PHYSICAL EXAMINATION: ECOG PERFORMANCE STATUS: 1 - Symptomatic but completely ambulatory  Vitals:   01/22/19 1220  BP: 121/82  Pulse: (!) 56  Resp: 18  Temp: 98.5 F (36.9 C)  SpO2: 99%   Filed Weights   01/22/19 1220  Weight: 158 lb 6.4 oz (71.8 kg)   .Body mass index is 28.97 kg/m.   GENERAL:alert, in no acute distress and comfortable SKIN: no acute rashes, no significant lesions EYES: conjunctiva are pink and non-injected, sclera anicteric OROPHARYNX: MMM, no exudates, no oropharyngeal erythema or ulceration NECK: supple, no JVD LYMPH:  no palpable lymphadenopathy in the cervical, axillary or inguinal regions LUNGS: clear to auscultation b/l with normal respiratory effort HEART: regular rate & rhythm ABDOMEN:  normoactive bowel sounds , non tender, not distended. No palpable hepatosplenomegaly.  Extremity: no pedal edema PSYCH: alert & oriented x 3 with fluent speech NEURO: no focal motor/sensory  deficits  LABORATORY DATA:  I have reviewed the data as listed  . CBC Latest Ref Rng & Units 01/22/2019 09/21/2018 06/12/2018  WBC 4.0 - 10.5 K/uL 4.4 3.4(L) 4.7  Hemoglobin 12.0 - 15.0 g/dL 13.6 13.9 10.2(L)  Hematocrit 36.0 - 46.0 % 40.5 41.2 31.4(L)  Platelets 150 - 400 K/uL 126(L) 106(L) 128(L)   ANC 200 . CMP Latest Ref Rng & Units 01/22/2019 11/07/2018 09/21/2018  Glucose 70 - 99 mg/dL 123(H) 110(H) 111(H)  BUN 8 - 23 mg/dL _0 Creatinine 0.44 - 1.00 mg/dL 1.19(H) 1.03(H) 1.16(H)  Sodium 135 - 145 mmol/L 141 141 140  Potassium 3.5 - 5.1 mmol/L 3.9 4.0 3.9  Chloride 98 - 111 mmol/L 106 107 106  CO2 22 - 32 mmol/L _1 Calcium 8.9 - 10.3 mg/dL 9.2 9.7 9.7  Total Protein 6.5 - 8.1 g/dL 6.4(L) 6.8 7.3  Total Bilirubin 0.3 - 1.2 mg/dL 0.4 0.7 0.9  Alkaline Phos 38 - 126 U/L 71 - 69  AST 15 - 41 U/L _2 ALT 0 - 44 U/L _3 Component     Latest Ref Rng & Units 05/02/2017 06/29/2017  HBV DNA SERPL PCR-ACNC     IU/mL 290 HBV DNA not detected  HBV DNA SERPL PCR-LOG IU     log10 IU/mL 2.462 UNABLE TO CALCULATE  Test Info:      Comment Comment   Component     Latest Ref Rng & Units 07/12/2017  HBV DNA SERPL PCR-ACNC     IU/mL HBV DNA not detected  HBV DNA SERPL PCR-LOG IU     log10 IU/mL UNABLE TO CALCULATE  Test Info:      Comment   Component     Latest Ref Rng & Units 05/02/2017  HBV DNA SERPL PCR-ACNC     IU/mL 290  HBV DNA SERPL PCR-LOG IU     log10 IU/mL 2.462  Test Info:      Comment  Hep B Core Ab, IgM     Negative Negative  Hep B Core Ab, Tot  Negative Positive (A)  Hepatitis B Surface Ag     Negative Confirm. indicated  HCV Ab     0.0 - 0.9 s/co ratio <0.1  HBsAg Conf      Positive (A)    PATHOLOGY    Diagnosis 04/25/17  Lymph node, needle/core biopsy, left - FOLLICULAR LYMPHOMA, GRADE 1-2. - SEE ONCOLOGY TABLE. Microscopic Comment LYMPHOMA Histologic type: Non-Hodgkin B-cell lymphoma: Follicular lymphoma. Grade (if  applicable): Low grade (grade 1-2). Flow cytometry: N/A. Immunohistochemical stains: CD20, CD3, CD5, CD10, CD23, CD43, bcl-2, bcl-6, CD138, light chains, Ki-67. Touch preps/imprints: N/A. Comments: Core biopsies reveal effacement by a vaguely nodular infiltrate of atypical lymphocytes. The lymphocytes are predominately small with irregular nuclear contours. There are no diffuse areas of large cells. Immunohistochemistry reveals abundant CD20-positive B-cells. CD3, CD43, and CD5 highlight interfollicular T-cells. Bcl-6 and CD23 reveal numerous small follicles with bcl-6 positive cells extending outside the follicular dendritic networks. The majority of these cells are bcl-2 positive. CD10 reveals only a few small residual germinal center foci. CD138 highlights scattered plasma cells which are polytypic by light chain in situ hybridization. The overall findings are most consistent with a low grade follicular lymphoma. Dr. Isaiah Blakes was notified on 04/27/2017.   RADIOGRAPHIC STUDIES: I have personally reviewed the radiological images as listed and agreed with the findings in the report. No results found.  Screening Mammogram 04/17/17 IMPRESSION:  The New enlarged nodes in both axillae are indeterminate. An [name] is recommended.    ASSESSMENT & PLAN:   Brooke Huber is a 73 y.o. Caucasian female with   1. 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  10/18/17 PET/CT revealed No measurable metabolic activity within previously enlarged cervical, axillary, mediastinal, and abdominal and pelvic lymph nodes ( [place] 1). 2. Findings support complete response to therapy. 3.  Spleen is normal size with normal metabolic activity. 4. No evidence of marrow involvement.   The pt reached complete metabolic response after three cycles of Bendamustine/Rituxan and 2 cycles of only Rituxan (due to neutropenia)  05/16/18 CT C/A/P revealed No residual or recurrent lymphadenopathy in the chest, abdomen, or pelvis. 2. Mild splenomegaly, maximum span 13.2 cm, significantly decreased in size compared to prior examinations. 3. Stable incidental 7 mm ground-glass opacity of the right upper lobe (series 6, image 54). Attention on follow-up.  2. Mild thrombocytopenia-- likely from lymphoma. ? BM involvement vs Immune thrombocytopenia.+ chemotherapy PLT improved to 42k   3. Chronic active Hepatitis B -Hepatitis B labs from 05/02/17 revealed chronic active disease which will be a limiting factor in her treatment.  -Pt completed Tenofovir AF with ID, Dr Linus Salmons -Tenofovir has successfully suppressed her Hep B virus as of 06/29/17, 07/24/2017, 10/17/2017 and 01/17/18 labs  PLAN: -Discussed pt labwork today, 01/22/19; blood counts look good, liver enzymes are stable,  -Discussed 01/22/2019 LDH is WNL at 192 -The pt shows no clinical, radiographic, or lab progression of her follicular lymphoma at this time. Will continue to monitor.  -Do not consider maintenance Rituxan to be appropriate for this pt considering risk posed by possible Hep B reactivation -Advised pt to avoid NSAIDs. Tylenol is okay for mild pains.  -Plan to get a rpt CT C/A/P in 1 year -Continue follow  up with Dr. Linus Salmons for Hep B management -Will see back in 6 months with labs  FOLLOW UP: RTC with Dr Irene Limbo with labs in 6 months  The total time spent in the appt was 15 minutes and more than 50% was on counseling and direct patient cares.  All of the patient's questions were answered with apparent satisfaction. The patient knows to call the clinic with any problems, questions or concerns.   Sullivan Lone MD [place] AAHIVMS [facility]  [facility] Hematology/Oncology Physician [facility]  (Office):       [phone] (Work cell):  [phone] (Fax):           [phone]  01/22/2019 1:00 PM  I, Yevette Edwards, am acting as a scribe for Dr. Sullivan Lone.   .I have reviewed the above documentation for accuracy and completeness, and I agree with the above. Brunetta Genera MD

## 2019-01-22 ENCOUNTER — Inpatient Hospital Stay: Payer: PPO | Attending: Hematology

## 2019-01-22 ENCOUNTER — Inpatient Hospital Stay (HOSPITAL_BASED_OUTPATIENT_CLINIC_OR_DEPARTMENT_OTHER): Payer: PPO | Admitting: Hematology

## 2019-01-22 ENCOUNTER — Other Ambulatory Visit: Payer: Self-pay

## 2019-01-22 ENCOUNTER — Other Ambulatory Visit: Payer: PPO

## 2019-01-22 ENCOUNTER — Ambulatory Visit: Payer: PPO | Admitting: Hematology

## 2019-01-22 VITALS — BP 121/82 | HR 56 | Temp 98.5°F | Resp 18 | Ht 62.0 in | Wt 158.4 lb

## 2019-01-22 DIAGNOSIS — Z79899 Other long term (current) drug therapy: Secondary | ICD-10-CM | POA: Diagnosis not present

## 2019-01-22 DIAGNOSIS — C8218 Follicular lymphoma grade II, lymph nodes of multiple sites: Secondary | ICD-10-CM | POA: Diagnosis not present

## 2019-01-22 DIAGNOSIS — B1910 Unspecified viral hepatitis B without hepatic coma: Secondary | ICD-10-CM | POA: Insufficient documentation

## 2019-01-22 DIAGNOSIS — D696 Thrombocytopenia, unspecified: Secondary | ICD-10-CM | POA: Diagnosis not present

## 2019-01-22 DIAGNOSIS — C8291 Follicular lymphoma, unspecified, lymph nodes of head, face, and neck: Secondary | ICD-10-CM | POA: Insufficient documentation

## 2019-01-22 DIAGNOSIS — F329 Major depressive disorder, single episode, unspecified: Secondary | ICD-10-CM | POA: Diagnosis not present

## 2019-01-22 DIAGNOSIS — M8580 Other specified disorders of bone density and structure, unspecified site: Secondary | ICD-10-CM | POA: Insufficient documentation

## 2019-01-22 LAB — CBC WITH DIFFERENTIAL/PLATELET
Abs Immature Granulocytes: 0.01 10*3/uL (ref 0.00–0.07)
Basophils Absolute: 0 10*3/uL (ref 0.0–0.1)
Basophils Relative: 1 %
Eosinophils Absolute: 0.1 10*3/uL (ref 0.0–0.5)
Eosinophils Relative: 3 %
HCT: 40.5 % (ref 36.0–46.0)
Hemoglobin: 13.6 g/dL (ref 12.0–15.0)
Immature Granulocytes: 0 %
Lymphocytes Relative: 14 %
Lymphs Abs: 0.6 10*3/uL — ABNORMAL LOW (ref 0.7–4.0)
MCH: 32.7 pg (ref 26.0–34.0)
MCHC: 33.6 g/dL (ref 30.0–36.0)
MCV: 97.4 fL (ref 80.0–100.0)
Monocytes Absolute: 0.5 10*3/uL (ref 0.1–1.0)
Monocytes Relative: 11 %
Neutro Abs: 3.1 10*3/uL (ref 1.7–7.7)
Neutrophils Relative %: 71 %
Platelets: 126 10*3/uL — ABNORMAL LOW (ref 150–400)
RBC: 4.16 MIL/uL (ref 3.87–5.11)
RDW: 12.5 % (ref 11.5–15.5)
WBC: 4.4 10*3/uL (ref 4.0–10.5)
nRBC: 0 % (ref 0.0–0.2)

## 2019-01-22 LAB — CMP (CANCER CENTER ONLY)
ALT: 18 U/L (ref 0–44)
AST: 27 U/L (ref 15–41)
Albumin: 3.6 g/dL (ref 3.5–5.0)
Alkaline Phosphatase: 71 U/L (ref 38–126)
Anion gap: 10 (ref 5–15)
BUN: 14 mg/dL (ref 8–23)
CO2: 25 mmol/L (ref 22–32)
Calcium: 9.2 mg/dL (ref 8.9–10.3)
Chloride: 106 mmol/L (ref 98–111)
Creatinine: 1.19 mg/dL — ABNORMAL HIGH (ref 0.44–1.00)
GFR, Est AFR Am: 52 mL/min — ABNORMAL LOW (ref >60)
GFR, Estimated: 45 mL/min — ABNORMAL LOW (ref >60)
Glucose, Bld: 123 mg/dL — ABNORMAL HIGH (ref 70–99)
Potassium: 3.9 mmol/L (ref 3.5–5.1)
Sodium: 141 mmol/L (ref 135–145)
Total Bilirubin: 0.4 mg/dL (ref 0.3–1.2)
Total Protein: 6.4 g/dL — ABNORMAL LOW (ref 6.5–8.1)

## 2019-01-22 LAB — LACTATE DEHYDROGENASE: LDH: 192 U/L (ref 98–192)

## 2019-01-23 ENCOUNTER — Telehealth: Payer: Self-pay | Admitting: Hematology

## 2019-01-23 NOTE — Telephone Encounter (Signed)
Scheduled appt per 12/22 los.  Spoke with pt and she is aware of the appt date and time

## 2019-02-19 DIAGNOSIS — Z20822 Contact with and (suspected) exposure to covid-19: Secondary | ICD-10-CM | POA: Diagnosis not present

## 2019-02-21 ENCOUNTER — Other Ambulatory Visit: Payer: Self-pay

## 2019-02-21 ENCOUNTER — Encounter (HOSPITAL_COMMUNITY): Payer: Self-pay | Admitting: Psychiatry

## 2019-02-21 ENCOUNTER — Ambulatory Visit (HOSPITAL_COMMUNITY): Payer: PPO | Admitting: Psychiatry

## 2019-02-21 DIAGNOSIS — F333 Major depressive disorder, recurrent, severe with psychotic symptoms: Secondary | ICD-10-CM

## 2019-02-21 DIAGNOSIS — F4310 Post-traumatic stress disorder, unspecified: Secondary | ICD-10-CM

## 2019-02-21 DIAGNOSIS — F411 Generalized anxiety disorder: Secondary | ICD-10-CM

## 2019-02-21 MED ORDER — SERTRALINE HCL 100 MG PO TABS: 100.0000 mg | ORAL_TABLET | Freq: Every day | ORAL | 0 refills | Status: DC

## 2019-02-21 MED ORDER — RISPERIDONE 1 MG PO TABS: 1.0000 mg | ORAL_TABLET | Freq: Every day | ORAL | 0 refills | Status: DC

## 2019-02-21 NOTE — Progress Notes (Unsigned)
Virtual Visit via Telephone Note  I connected with [name] on 02/21/19 at  1:00 PM EST by telephone and verified that I am speaking with the correct person using two identifiers.  Location: Patient: home Provider: office   I discussed the limitations, risks, security and privacy concerns of performing an evaluation and management service by telephone and the availability of in person appointments. I also discussed with the patient that there may be a patient responsible charge related to this service. The patient expressed understanding and agreed to proceed.   History of Present Illness: ***   Observations/Objective:  General Appearance: {Appearance:22683}  Eye Contact:  {BHH EYE CONTACT:22684}  Speech:  {Speech:22685}  Volume:  {Volume (PAA):22686}  Mood:  {BHH MOOD:22306}  Affect:  {Affect (PAA):22687}  Thought Process:  {Thought Process (PAA):22688}  Orientation:  {BHH ORIENTATION (PAA):22689}  Thought Content:  {Thought Content:22690}  Suicidal Thoughts:  {ST/HT (PAA):22692}  Homicidal Thoughts:  {ST/HT (PAA):22692}  Memory:  {BHH MEMORY:22881}  Judgement:  {Judgement (PAA):22694}  Insight:  {Insight (PAA):22695}  Psychomotor Activity:  {Psychomotor (PAA):22696}  Concentration:  {Concentration:21399}  Recall:  {BHH GOOD/FAIR/POOR:22877}  Fund of Knowledge:  {BHH GOOD/FAIR/POOR:22877}  Language:  {BHH GOOD/FAIR/POOR:22877}  Akathisia:  {BHH YES OR NO:22294}  Handed:  {Handed:22697}  AIMS (if indicated):     Assets:  {Assets (PAA):22698}  ADL's:  {BHH XO:4411959  Cognition:  {chl bhh cognition:304700322}  Sleep:        Assessment and Plan: ***  Follow Up Instructions: In 2 months or sooner if needed   I discussed the assessment and treatment plan with the patient. The patient was provided an opportunity to ask questions and all were answered. The patient agreed with the plan and demonstrated an understanding of the instructions.   The patient was  advised to call back or seek an in-person evaluation if the symptoms worsen or if the condition fails to improve as anticipated.  I provided *** minutes of non-face-to-face time during this encounter.   Charlcie Cradle, MD

## 2019-03-12 ENCOUNTER — Ambulatory Visit: Payer: Self-pay

## 2019-03-14 ENCOUNTER — Ambulatory Visit: Payer: Self-pay

## 2019-04-25 ENCOUNTER — Encounter (HOSPITAL_COMMUNITY): Payer: Self-pay | Admitting: Psychiatry

## 2019-04-25 ENCOUNTER — Other Ambulatory Visit: Payer: Self-pay

## 2019-04-25 ENCOUNTER — Ambulatory Visit (INDEPENDENT_AMBULATORY_CARE_PROVIDER_SITE_OTHER): Payer: PPO | Admitting: Psychiatry

## 2019-04-25 DIAGNOSIS — F4310 Post-traumatic stress disorder, unspecified: Secondary | ICD-10-CM

## 2019-04-25 DIAGNOSIS — F410 Panic disorder [episodic paroxysmal anxiety] without agoraphobia: Secondary | ICD-10-CM | POA: Diagnosis not present

## 2019-04-25 DIAGNOSIS — F333 Major depressive disorder, recurrent, severe with psychotic symptoms: Secondary | ICD-10-CM

## 2019-04-25 DIAGNOSIS — F411 Generalized anxiety disorder: Secondary | ICD-10-CM

## 2019-04-25 MED ORDER — RISPERIDONE 1 MG PO TABS: 1.0000 mg | ORAL_TABLET | Freq: Every day | ORAL | 1 refills | Status: DC

## 2019-04-25 MED ORDER — SERTRALINE HCL 100 MG PO TABS: 100.0000 mg | ORAL_TABLET | Freq: Every day | ORAL | 1 refills | Status: DC

## 2019-04-25 NOTE — Progress Notes (Signed)
Virtual Visit via Telephone Note  I connected with Brooke Huber on 04/25/19 at 11:30 AM EDT by telephone and verified that I am speaking with the correct person using two identifiers.  Location: Patient: home Provider: office   I discussed the limitations, risks, security and privacy concerns of performing an evaluation and management service by telephone and the availability of in person appointments. I also discussed with the patient that there may be a patient responsible charge related to this service. The patient expressed understanding and agreed to proceed.   History of Present Illness: "I am taking it one day at a time". She has started going to clean up the [place] once a month. She is talking to friends and family on the phone. Sometimes her family will come and she enjoys the visits. Her crying episodes have decreased with some weeks being less than others. Her sleep is better as compared to before. The increase medication dose has decreased her depression. The depression is more manageable and is coping with stress better. She denies SI/HI/AVH. Her anxiety is more manageable. She denies any panic attacks. She has rare nightmares and random intrusive memories.     Observations/Objective:  General Appearance: unable to assess  Eye Contact:  unable to assess  Speech:  Clear and Coherent and Slow  Volume:  Normal  Mood:  Anxious and Depressed  Affect:  Congruent and brighter than at previous visit  Thought Process:  Goal Directed, Linear and Descriptions of Associations: Intact  Orientation:  Full (Time, Place, and Person)  Thought Content:  Logical  Suicidal Thoughts:  No  Homicidal Thoughts:  No  Memory:  Immediate;   Good  Judgement:  Good  Insight:  Good  Psychomotor Activity: unable to assess  Concentration:  Concentration: Good  Recall:  Good  Fund of Knowledge:  Good  Language:  Good  Akathisia:  unable to assess  Handed:  Right  AIMS (if indicated):      Assets:  Communication Skills Desire for Improvement Financial Resources/Insurance Housing Resilience Social Support Talents/Skills Transportation  ADL's:  unable to assess  Cognition:  WNL  Sleep:         Assessment and Plan: GAD; MDD- severe with psychotic features; 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 The patient was provided an opportunity to ask questions and all were answered. The patient agreed with the plan and demonstrated an understanding of the instructions.   The patient was advised to call back or seek an in-person evaluation if the symptoms worsen or if the condition fails to improve as anticipated.  I provided 25 minutes of non-face-to-face time during this encounter.   Charlcie Cradle, MD

## 2019-05-08 ENCOUNTER — Encounter: Payer: Self-pay | Admitting: Internal Medicine

## 2019-05-08 ENCOUNTER — Other Ambulatory Visit: Payer: Self-pay

## 2019-05-08 ENCOUNTER — Ambulatory Visit: Payer: PPO | Admitting: Internal Medicine

## 2019-05-08 VITALS — BP 107/70 | HR 67 | Temp 98.0°F | Ht 63.0 in | Wt 166.0 lb

## 2019-05-08 DIAGNOSIS — Z9189 Other specified personal risk factors, not elsewhere classified: Secondary | ICD-10-CM

## 2019-05-08 DIAGNOSIS — B181 Chronic viral hepatitis B without delta-agent: Secondary | ICD-10-CM

## 2019-05-08 NOTE — Assessment & Plan Note (Signed)
She is doing well with no new concerns and her viral load has remained suppressed now.  I will follow her labs yearly, sooner if her LFTs rise unexpectedly.

## 2019-05-08 NOTE — Assessment & Plan Note (Signed)
She will continue with [facility] screening every 6 months with ultrasound and will schedule this.

## 2019-05-08 NOTE — Progress Notes (Signed)
   Subjective:    Patient ID: Brooke Huber, female    DOB: 06-29-1945, 74 y.o.   MRN: [id number]  HPI Here for follow up of chronic hepatitis B She was treated for lymphoma by Dr. Irene Limbo and was on suppressive tenofovir for history of chronic carrier of hepatitis B with a low level viremia prior to and during treatment for lymphoma.  She then stopped the medication during her chemo and developed a flare of acute hepatitis B.  LFTs in the thousands and T bili around 25.  DNA was sent but no result reported from the inpatient labs.  She was put back on TAF and has continued on this.   She is here for follow up and no new issues.  No missed doses.  Last viral load was negative.  Recent LFTs wnl.  No new complaints.    Review of Systems  Constitutional: Negative for fatigue and unexpected weight change.  Gastrointestinal: Negative for diarrhea.  Skin: Negative for rash.       Objective:   Physical Exam Constitutional:      Appearance: Normal appearance.  Eyes:     General: No scleral icterus. Cardiovascular:     Rate and Rhythm: Normal rate and regular rhythm.     Heart sounds: No murmur.  Pulmonary:     Effort: Pulmonary effort is normal.     Breath sounds: Normal breath sounds.  Skin:    Findings: No rash.  Neurological:     Mental Status: She is alert.  Psychiatric:        Mood and Affect: Mood normal.   SH: no alcohol        Assessment & Plan:

## 2019-05-09 ENCOUNTER — Other Ambulatory Visit: Payer: PPO

## 2019-05-09 ENCOUNTER — Ambulatory Visit
Admission: RE | Admit: 2019-05-09 | Discharge: 2019-05-09 | Disposition: A | Discharge status: Home / Self Care | Payer: PPO | Source: Ambulatory Visit | Attending: Internal Medicine | Admitting: Internal Medicine

## 2019-05-09 DIAGNOSIS — B181 Chronic viral hepatitis B without delta-agent: Secondary | ICD-10-CM

## 2019-05-09 DIAGNOSIS — Z9189 Other specified personal risk factors, not elsewhere classified: Secondary | ICD-10-CM

## 2019-05-11 LAB — HEPATITIS B SURFACE ANTIGEN: Hepatitis B Surface Ag: REACTIVE — AB

## 2019-05-11 LAB — HEPATITIS B DNA, ULTRAQUANTITATIVE, PCR
Hepatitis B DNA (Calc): <1 Log IU/mL — ABNORMAL HIGH
Hepatitis B DNA: <10 IU/mL — ABNORMAL HIGH

## 2019-07-04 DIAGNOSIS — Z1231 Encounter for screening mammogram for malignant neoplasm of breast: Secondary | ICD-10-CM | POA: Diagnosis not present

## 2019-07-22 NOTE — Progress Notes (Signed)
HEMATOLOGY/ONCOLOGY CLINIC NOTE  Date of Service:  07/23/19    Patient Care Team: Alroy Dust, L.Marlou Sa, MD as PCP - General (Family Medicine)  CHIEF COMPLAINTS/PURPOSE OF CONSULTATION:  F/u for mx of  Follicular Lymphoma  HISTORY OF PRESENTING ILLNESS:    Brooke Huber is a wonderful 74 y.o. female who has been referred to [name] by her PCP, Dr. 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 She presents to the clinic today accompanied by her friend.   She had a yearly screening mammogram on 04/17/17 which found her to have an enlarged axillary lymph nodes. She had a biopsy on 04/28/17 which showed evidence of lymphoma.   Today she notes she feels fine in general with no changes over the last 6 months. She hopes to go to the beach on the 18th of this month with her friend for 5 days.   She notes years ago in the late 1990s she went to donate blood and they told her she had Hep B, but never had liver damage/change or concerning symptoms. She never had been out of the country, tattoo or blood transfusion. She denies any other major comorbidities. She sees a psychiatrist every 4 months for her depression and anxiety. She notes diagnosis like this can trigger her depression but she is working to maintain her mood during this time.   On review of symptoms, Brooke Huber notes limited ROM of her left arm for the last 2 years with tenderness. She notes arthritis in her hands and possibly in her shoulder. She denies bowel issues or trouble with bowel movements or passing urine.  Interval History: Brooke Huber returns today regarding her low grade Follicular Lymphoma after completing BR treatment. The patient's last visit with [name] was on 01/22/2019. The Brooke Huber reports that she is doing well overall.  The Brooke Huber reports that she fell getting out of bed recently and received a black eye on the left side. She denies being dizzy or lightheaded at the time and states that she fell due to her  legs becoming tangled in her sheets. Brooke Huber has been taken off of Potassium, but has continued to take Tenofovir. She is currently following with Dr. Linus Salmons, Infectious Disease Specialist, yearly. She has received her COVID19 vaccines and tolerated them well.   Lab results today (07/23/19) of CBC w/diff and CMP is as follows: all values are WNL except for WBC at 3.7K, PLT at 85K, Lymphs Abs at 0.6K, Glucose at 136, Creatinine at 1.33, GFR Est Non Af Am at 39. 07/23/2019 LDH at 157  On review of systems, Brooke Huber denies new lumps/bumps, fatigue, mouth sores, rashes, abdominal pain, dizziness, lightheadedness and any other symptoms.   MEDICAL HISTORY:  Past Medical History:  Diagnosis Date  . 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 Cancer [facility])    breast lymphoma with mets  . Depression   . GERD (gastroesophageal reflux disease)   . Hepatitis   . Hepatitis B 02/1995  . Seizures ([place])    hx of seizure in 1993 and 1994 - caused by stress per patient   -Diverticulitis  -Osteopenia  -Acute gastritis   SURGICAL HISTORY: Past Surgical History:  Procedure Laterality Date  . APPENDECTOMY    . COLONOSCOPY WITH PROPOFOL N/A 12/29/2015   Procedure: COLONOSCOPY WITH PROPOFOL;  Surgeon: Garlan Fair, MD;  Location: [facility];  Service: Endoscopy;  Laterality: N/A;  . TONSILLECTOMY AND ADENOIDECTOMY Bilateral 1954  . TUBAL LIGATION  SOCIAL HISTORY: Social History   Socioeconomic History  . Marital status: Single    Spouse name: Not on file  . Number of children: Not on file  . Years of education: Not on file  . Highest education level: Not on file  Occupational History  . Not on file  Tobacco Use  . Smoking status: Never Smoker  . Smokeless tobacco: Never Used  Vaping Use  . Vaping Use: Never used  Substance and Sexual Activity  . Alcohol use: No  . Drug use: No  . Sexual activity: Never    Birth control/protection: None  Other Topics Concern  . Not on file  Social History  Narrative  . Not on file   Social Determinants of Health   Financial Resource Strain:   . Difficulty of Paying Living Expenses:   Food Insecurity:   . Worried About Charity fundraiser in the Last Year:   . Arboriculturist in the Last Year:   Transportation Needs:   . Film/video editor (Medical):   Marland Kitchen Lack of Transportation (Non-Medical):   Physical Activity:   . Days of Exercise per Week:   . Minutes of Exercise per Session:   Stress:   . Feeling of Stress :   Social Connections:   . Frequency of Communication with Friends and Family:   . Frequency of Social Gatherings with Friends and Family:   . Attends Religious Services:   . Active Member of Clubs or Organizations:   . Attends Archivist Meetings:   Marland Kitchen Marital Status:   Intimate Partner Violence:   . Fear of Current or Ex-Partner:   . Emotionally Abused:   Marland Kitchen Physically Abused:   . Sexually Abused:     FAMILY HISTORY: Family History  Problem Relation Age of Onset  . Alzheimer's disease Sister   . Dementia Sister   . Sexual abuse Sister   . Seizures Sister   . Alcohol abuse Brother   . Alcohol abuse Maternal Uncle   . Drug abuse Cousin   . Drug abuse Son   . Depression Neg Hx     ALLERGIES:  is allergic to penicillins.  MEDICATIONS:  Current Outpatient Medications  Medication Sig Dispense Refill  . Calcium Carb-Cholecalciferol 600-100 MG-UNIT CAPS Take 1 tablet by mouth daily.    . Cholecalciferol (VITAMIN D-400 PO) Take by mouth. 1x/ day    . famotidine (PEPCID) 20 MG tablet Take 20 mg by mouth 2 (two) times daily.    . Multiple Vitamin (MULTIVITAMIN WITH MINERALS) TABS tablet Take 1 tablet by mouth daily. 30 tablet 0  . risperiDONE (RISPERDAL) 1 MG tablet Take 1 tablet (1 mg total) by mouth at bedtime. 90 tablet 1  . senna-docusate (SENOKOT-S) 8.6-50 MG tablet Take 1 tablet by mouth at bedtime as needed for mild constipation. 30 tablet 0  . sertraline (ZOLOFT) 100 MG tablet Take 1 tablet (100  mg total) by mouth daily. 90 tablet 1  . Tenofovir Alafenamide Fumarate (VEMLIDY) 25 MG TABS Take 1 tablet (25 mg total) by mouth daily. 30 tablet 11  . polyethylene glycol (MIRALAX / GLYCOLAX) 17 g packet Take 17 g by mouth daily. (Patient not taking: Reported on 05/08/2019) 14 each 0   No current facility-administered medications for this visit.   Facility-Administered Medications Ordered in Other Visits  Medication Dose Route Frequency Provider Last Rate Last Admin  . pneumococcal 13-valent conjugate vaccine (PREVNAR 13) injection 0.5 mL  0.5 mL Intramuscular Once  Brunetta Genera, MD        REVIEW OF SYSTEMS:   A 10+ POINT REVIEW OF SYSTEMS WAS OBTAINED including neurology, dermatology, psychiatry, cardiac, respiratory, lymph, extremities, GI, GU, Musculoskeletal, constitutional, breasts, reproductive, HEENT.  All pertinent positives are noted in the HPI.  All others are negative.   PHYSICAL EXAMINATION: ECOG PERFORMANCE STATUS: 1 - Symptomatic but completely ambulatory  Vitals:   07/23/19 1135  BP: 121/64  Pulse: 61  Resp: 18  Temp: 97.7 F (36.5 C)  SpO2: 95%   Filed Weights   07/23/19 1135  Weight: 167 lb 6.4 oz (75.9 kg)   .Body mass index is 29.65 kg/m.   Exam was given in a chair   GENERAL:alert, in no acute distress and comfortable SKIN: no acute rashes, no significant lesions EYES: conjunctiva are pink and non-injected, sclera anicteric OROPHARYNX: MMM, no exudates, no oropharyngeal erythema or ulceration NECK: supple, no JVD LYMPH:  no palpable lymphadenopathy in the cervical, axillary or inguinal regions LUNGS: clear to auscultation b/l with normal respiratory effort HEART: regular rate & rhythm ABDOMEN:  normoactive bowel sounds , non tender, not distended. No palpable hepatosplenomegaly.  Extremity: no pedal edema PSYCH: alert & oriented x 3 with fluent speech NEURO: no focal motor/sensory deficits  LABORATORY DATA:  I have reviewed the data as  listed  . CBC Latest Ref Rng & Units 07/23/2019 01/22/2019 09/21/2018  WBC 4.0 - 10.5 K/uL 3.7(L) 4.4 3.4(L)  Hemoglobin 12.0 - 15.0 g/dL 13.9 13.6 13.9  Hematocrit 36 - 46 % 41.3 40.5 41.2  Platelets 150 - 400 K/uL 85(L) 126(L) 106(L)   ANC 200 . CMP Latest Ref Rng & Units 07/23/2019 01/22/2019 11/07/2018  Glucose 70 - 99 mg/dL 136(H) 123(H) 110(H)  BUN 8 - 23 mg/dL 16 14 16   Creatinine 0.44 - 1.00 mg/dL 1.33(H) 1.19(H) 1.03(H)  Sodium 135 - 145 mmol/L 141 141 141  Potassium 3.5 - 5.1 mmol/L 3.8 3.9 4.0  Chloride 98 - 111 mmol/L 108 106 107  CO2 22 - 32 mmol/L 24 25 28   Calcium 8.9 - 10.3 mg/dL 9.3 9.2 9.7  Total Protein 6.5 - 8.1 g/dL 6.8 6.4(L) 6.8  Total Bilirubin 0.3 - 1.2 mg/dL 0.8 0.4 0.7  Alkaline Phos 38 - 126 U/L 55 71 -  AST 15 - 41 U/L 20 27 28   ALT 0 - 44 U/L 15 18 23    Component     Latest Ref Rng & Units 05/02/2017 06/29/2017  HBV DNA SERPL PCR-ACNC     IU/mL 290 HBV DNA not detected  HBV DNA SERPL PCR-LOG IU     log10 IU/mL 2.462 UNABLE TO CALCULATE  Test Info:      Comment Comment   Component     Latest Ref Rng & Units 07/12/2017  HBV DNA SERPL PCR-ACNC     IU/mL HBV DNA not detected  HBV DNA SERPL PCR-LOG IU     log10 IU/mL UNABLE TO CALCULATE  Test Info:      Comment   Component     Latest Ref Rng & Units 05/02/2017  HBV DNA SERPL PCR-ACNC     IU/mL 290  HBV DNA SERPL PCR-LOG IU     log10 IU/mL 2.462  Test Info:      Comment  Hep B Core Ab, IgM     Negative Negative  Hep B Core Ab, Tot     Negative Positive (A)  Hepatitis B Surface Ag     Negative Confirm. indicated  HCV Ab     0.0 - 0.9 s/co ratio <0.1  HBsAg Conf      Positive (A)    PATHOLOGY    Diagnosis 04/25/17  Lymph node, needle/core biopsy, left - FOLLICULAR LYMPHOMA, GRADE 1-2. - SEE ONCOLOGY TABLE. Microscopic Comment LYMPHOMA Histologic type: Non-Hodgkin B-cell lymphoma: Follicular lymphoma. Grade (if applicable): Low grade (grade 1-2). Flow cytometry:  N/A. Immunohistochemical stains: CD20, CD3, CD5, CD10, CD23, CD43, bcl-2, bcl-6, CD138, light chains, Ki-67. Touch preps/imprints: N/A. Comments: Core biopsies reveal effacement by a vaguely nodular infiltrate of atypical lymphocytes. The lymphocytes are predominately small with irregular nuclear contours. There are no diffuse areas of large cells. Immunohistochemistry reveals abundant CD20-positive B-cells. CD3, CD43, and CD5 highlight interfollicular T-cells. Bcl-6 and CD23 reveal numerous small follicles with bcl-6 positive cells extending outside the follicular dendritic networks. The majority of these cells are bcl-2 positive. CD10 reveals only a few small residual germinal center foci. CD138 highlights scattered plasma cells which are polytypic by light chain in situ hybridization. The overall findings are most consistent with a low grade follicular lymphoma. Dr. Isaiah Blakes was notified on 04/27/2017.   RADIOGRAPHIC STUDIES: I have personally reviewed the radiological images as listed and agreed with the findings in the report. No results found.  Screening Mammogram 04/17/17 IMPRESSION:  The New enlarged nodes in both axillae are indeterminate. An [name] is recommended.    ASSESSMENT & PLAN:   Brooke Huber is a 74 y.o. Caucasian female with   1. 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  10/18/17 PET/CT revealed No measurable metabolic activity within previously enlarged cervical, axillary, mediastinal, and abdominal and pelvic lymph nodes ( [place] 1). 2. Findings support complete response to therapy. 3. Spleen is normal size with normal metabolic activity.  4. No evidence of marrow involvement.   The Brooke Huber reached complete metabolic response after three cycles of Bendamustine/Rituxan and 2 cycles of only Rituxan (due to neutropenia)  05/16/18 CT C/A/P revealed No residual or recurrent lymphadenopathy in the chest, abdomen, or pelvis. 2. Mild splenomegaly, maximum span 13.2 cm, significantly decreased in size compared to prior examinations. 3. Stable incidental 7 mm ground-glass opacity of the right upper lobe (series 6, image 54). Attention on follow-up.  2. Mild thrombocytopenia-- likely from lymphoma. ? BM involvement vs Immune thrombocytopenia.+ chemotherapy PLT improved to 42k   3. Chronic active Hepatitis B -Hepatitis B labs from 05/02/17 revealed chronic active disease which will be a limiting factor in her treatment.  -Brooke Huber completed Tenofovir AF with ID, Dr Linus Salmons -Tenofovir has successfully suppressed her Hep B virus as of 06/29/17, 07/24/2017, 10/17/2017 and 01/17/18 labs  PLAN: -Discussed Brooke Huber labwork today, 07/23/19; WBC and PLT are low, no anemia, kidney functions have gone up slightly, other blood chemistries are nml, LDH is WNL -The Brooke Huber shows no clinical or lab progression of her follicular lymphoma at this time. Will continue to monitor. -Advised Brooke Huber that Tenofovir can affect PLT count. Will continue to monitor.  -Recommend Brooke Huber continue to f/u with Dr. Linus Salmons for Hep B management -Will get repeat CT C/A/P prior to next visit. Will avoid IV contrast due to Brooke Huber being on Tenofovir.  -Will see back in 6 months, with labs 2 weeks prior  FOLLOW UP: Labs in 22 weeks CT chest/abd/pelvis in 22 weeks RTC with Dr Irene Limbo in 24 weeks   The total time spent in the appt was 30 minutes and more than 50% was on counseling and direct patient cares.  All of the patient's questions were answered with apparent satisfaction. The patient knows to call the clinic with any problems, questions or concerns.    Sullivan Lone MD [place] AAHIVMS [facility] Hematology/Oncology  Physician [facility]  (Office):       [phone] (Work cell):  [phone] (Fax):           [phone]  07/23/2019 12:20 PM  I, Yevette Edwards, am acting as a scribe for Dr. Sullivan Lone.   .I have reviewed the above documentation for accuracy and completeness, and I agree with the above. Brunetta Genera MD

## 2019-07-23 ENCOUNTER — Inpatient Hospital Stay: Payer: PPO | Attending: Hematology | Admitting: Hematology

## 2019-07-23 ENCOUNTER — Inpatient Hospital Stay: Payer: PPO

## 2019-07-23 ENCOUNTER — Other Ambulatory Visit: Payer: Self-pay

## 2019-07-23 VITALS — BP 121/64 | HR 61 | Temp 97.7°F | Resp 18 | Ht 63.0 in | Wt 167.4 lb

## 2019-07-23 DIAGNOSIS — F419 Anxiety disorder, unspecified: Secondary | ICD-10-CM | POA: Diagnosis not present

## 2019-07-23 DIAGNOSIS — B1910 Unspecified viral hepatitis B without hepatic coma: Secondary | ICD-10-CM | POA: Insufficient documentation

## 2019-07-23 DIAGNOSIS — C8218 Follicular lymphoma grade II, lymph nodes of multiple sites: Secondary | ICD-10-CM | POA: Diagnosis not present

## 2019-07-23 DIAGNOSIS — K219 Gastro-esophageal reflux disease without esophagitis: Secondary | ICD-10-CM | POA: Diagnosis not present

## 2019-07-23 DIAGNOSIS — Z79899 Other long term (current) drug therapy: Secondary | ICD-10-CM | POA: Insufficient documentation

## 2019-07-23 DIAGNOSIS — C8214 Follicular lymphoma grade II, lymph nodes of axilla and upper limb: Secondary | ICD-10-CM | POA: Diagnosis not present

## 2019-07-23 DIAGNOSIS — D696 Thrombocytopenia, unspecified: Secondary | ICD-10-CM | POA: Diagnosis not present

## 2019-07-23 DIAGNOSIS — F329 Major depressive disorder, single episode, unspecified: Secondary | ICD-10-CM | POA: Insufficient documentation

## 2019-07-23 DIAGNOSIS — M8580 Other specified disorders of bone density and structure, unspecified site: Secondary | ICD-10-CM | POA: Diagnosis not present

## 2019-07-23 LAB — CBC WITH DIFFERENTIAL/PLATELET
Abs Immature Granulocytes: 0.01 10*3/uL (ref 0.00–0.07)
Basophils Absolute: 0 10*3/uL (ref 0.0–0.1)
Basophils Relative: 1 %
Eosinophils Absolute: 0.1 10*3/uL (ref 0.0–0.5)
Eosinophils Relative: 2 %
HCT: 41.3 % (ref 36.0–46.0)
Hemoglobin: 13.9 g/dL (ref 12.0–15.0)
Immature Granulocytes: 0 %
Lymphocytes Relative: 15 %
Lymphs Abs: 0.6 10*3/uL — ABNORMAL LOW (ref 0.7–4.0)
MCH: 31 pg (ref 26.0–34.0)
MCHC: 33.7 g/dL (ref 30.0–36.0)
MCV: 92 fL (ref 80.0–100.0)
Monocytes Absolute: 0.4 10*3/uL (ref 0.1–1.0)
Monocytes Relative: 9 %
Neutro Abs: 2.7 10*3/uL (ref 1.7–7.7)
Neutrophils Relative %: 73 %
Platelets: 85 10*3/uL — ABNORMAL LOW (ref 150–400)
RBC: 4.49 MIL/uL (ref 3.87–5.11)
RDW: 12.6 % (ref 11.5–15.5)
WBC: 3.7 10*3/uL — ABNORMAL LOW (ref 4.0–10.5)
nRBC: 0 % (ref 0.0–0.2)

## 2019-07-23 LAB — CMP (CANCER CENTER ONLY)
ALT: 15 U/L (ref 0–44)
AST: 20 U/L (ref 15–41)
Albumin: 4 g/dL (ref 3.5–5.0)
Alkaline Phosphatase: 55 U/L (ref 38–126)
Anion gap: 9 (ref 5–15)
BUN: 16 mg/dL (ref 8–23)
CO2: 24 mmol/L (ref 22–32)
Calcium: 9.3 mg/dL (ref 8.9–10.3)
Chloride: 108 mmol/L (ref 98–111)
Creatinine: 1.33 mg/dL — ABNORMAL HIGH (ref 0.44–1.00)
GFR, Est AFR Am: 46 mL/min — ABNORMAL LOW (ref >60)
GFR, Estimated: 39 mL/min — ABNORMAL LOW (ref >60)
Glucose, Bld: 136 mg/dL — ABNORMAL HIGH (ref 70–99)
Potassium: 3.8 mmol/L (ref 3.5–5.1)
Sodium: 141 mmol/L (ref 135–145)
Total Bilirubin: 0.8 mg/dL (ref 0.3–1.2)
Total Protein: 6.8 g/dL (ref 6.5–8.1)

## 2019-07-23 LAB — LACTATE DEHYDROGENASE: LDH: 157 U/L (ref 98–192)

## 2019-07-23 NOTE — Patient Instructions (Signed)
Thank you for choosing [facility] to provide your oncology and hematology care.   Should you have questions after your visit to the [facility] ([facility]), please contact this office at [phone] between 8:30 AM and 4:30 PM.  Voice mails left after 4:00 PM may not be returned until the following business day.  Calls received after 4:30 PM will be answered by an off-site Nurse Triage Line.    Prescription Refills:  Please have your pharmacy contact us directly for most prescription requests.  Contact the office directly for refills of narcotics (pain medications). Allow 48-72 hours for refills.  Appointments: Please contact the [facility] scheduling department [phone] for questions regarding [facility] appointment scheduling.  Contact the schedulers with any scheduling changes so that your appointment can be rescheduled in a timely manner.   Central Scheduling for [place] [phone] - Call to schedule procedures such as PET scans, CT scans, MRI, Ultrasound, etc.  To afford each patient quality time with our providers, please arrive 30 minutes before your scheduled appointment time.  If you arrive late for your appointment, you may be asked to reschedule.  We strive to give you quality time with our providers, and arriving late affects you and other patients whose appointments are after yours. If you are a no show for multiple scheduled visits, you may be dismissed from the clinic at the providers discretion.     Resources: [facility] Social Workers [phone] for additional information on assistance programs or assistance connecting with community support programs   [place] DSS  336-641-3447: Information regarding food stamps, Medicaid, and utility assistance [place] 336-333-6589   [place] [employer]'s shared-ride transportation service for eligible riders who have a disability that prevents them from riding the fixed route bus.   Medicare Rights Center  800-333-4114 Helps people with Medicare understand their rights and benefits, navigate the Medicare system, and secure the quality healthcare they deserve [employer] 800-227-2345 Assists patients locate various types of support and financial assistance Cancer Care: 1-800-813-HOPE (4673) Provides financial assistance, online support groups, medication/co-pay assistance.   Transportation Assistance for appointments at [facility]: Transportation Coordinator [phone]  Again, thank you for choosing [facility] for your care.       

## 2019-07-24 DIAGNOSIS — Z0000 Encounter for general adult medical examination without abnormal findings: Secondary | ICD-10-CM | POA: Diagnosis not present

## 2019-07-24 DIAGNOSIS — K5900 Constipation, unspecified: Secondary | ICD-10-CM | POA: Diagnosis not present

## 2019-07-26 ENCOUNTER — Other Ambulatory Visit: Payer: Self-pay | Admitting: Internal Medicine

## 2019-08-01 ENCOUNTER — Telehealth: Payer: Self-pay | Admitting: Pharmacy Technician

## 2019-08-01 NOTE — Telephone Encounter (Signed)
[facility] Patient Advocate Encounter  Completed and sent [employer] Advancing Access application for [name] for this patient who is uninsured.    They will have a decision within 2 business days.  The Patient [name] is now closed for Hepatits B.  Brooke Huber. Nadara Mustard [place] Patient [facility] for Infectious Disease Phone: [phone] Fax:  [phone]

## 2019-08-06 ENCOUNTER — Telehealth: Payer: Self-pay | Admitting: Pharmacy Technician

## 2019-08-06 NOTE — Telephone Encounter (Signed)
[facility] Patient Advocate Encounter  Completed and sent [employer] Advancing Access application for [place] for this patient who is insured, however cannot afford her copay.  She has Medicare insurance that will not accept a copay card.  Advancing Access approved her.    Patient is approved 08/06/2019 through 08/05/2020.  BIN      [id number] PCN    [id number] GRP    [id number] ID        [id number]  I called this information into her pharmacy and let her know.   Brooke Huber. [name] Patient [facility] for Infectious Disease Phone: [phone] Fax:  [phone]

## 2019-08-08 ENCOUNTER — Encounter (HOSPITAL_COMMUNITY): Payer: Self-pay | Admitting: Psychiatry

## 2019-08-08 ENCOUNTER — Other Ambulatory Visit: Payer: Self-pay

## 2019-08-08 ENCOUNTER — Telehealth (INDEPENDENT_AMBULATORY_CARE_PROVIDER_SITE_OTHER): Payer: PPO | Admitting: Psychiatry

## 2019-08-08 DIAGNOSIS — Z5181 Encounter for therapeutic drug level monitoring: Secondary | ICD-10-CM

## 2019-08-08 DIAGNOSIS — F411 Generalized anxiety disorder: Secondary | ICD-10-CM

## 2019-08-08 DIAGNOSIS — F333 Major depressive disorder, recurrent, severe with psychotic symptoms: Secondary | ICD-10-CM

## 2019-08-08 DIAGNOSIS — F4310 Post-traumatic stress disorder, unspecified: Secondary | ICD-10-CM

## 2019-08-08 MED ORDER — RISPERIDONE 1 MG PO TABS: 1.0000 mg | ORAL_TABLET | Freq: Every day | ORAL | 1 refills | Status: DC

## 2019-08-08 MED ORDER — SERTRALINE HCL 100 MG PO TABS: 100.0000 mg | ORAL_TABLET | Freq: Every day | ORAL | 1 refills | Status: DC

## 2019-08-08 NOTE — Progress Notes (Signed)
Virtual Visit via Telephone Note  I connected with [name] on 08/08/19 at  1:30 PM EDT by telephone and verified that I am speaking with the correct person using two identifiers.  Location: Patient: home Provider: office   I discussed the limitations, risks, security and privacy concerns of performing an evaluation and management service by telephone and the availability of in person appointments. I also discussed with the patient that there may be a patient responsible charge related to this service. The patient expressed understanding and agreed to proceed.   History of Present Illness: Renly shares that this week was hard. She ran out of hepatitis meds for a few days. She is having situational anxiety due to house stressors. Anxiety is not affecting her sleep. Adriyanna denies any panic attacks. Her depression is mild. She is active and denies isolation. She denies SI/HI. She is denying symptoms of PTSD.    Observations/Objective:  General Appearance: unable to assess  Eye Contact:  unable to assess  Speech:  Clear and Coherent and Normal Rate  Volume:  Normal  Mood:  Anxious and Depressed  Affect:  Full Range  Thought Process:  Goal Directed and Descriptions of Associations: Intact  Orientation:  Full (Time, Place, and Person)  Thought Content:  Logical  Suicidal Thoughts:  No  Homicidal Thoughts:  No  Memory:  Immediate;   Good  Judgement:  Good  Insight:  Good  Psychomotor Activity: unable to assess  Concentration:  Concentration: Good  Recall:  Good  Fund of Knowledge:  Good  Language:  Good  Akathisia:  unable to assess  Handed:  Right  AIMS (if indicated):     Assets:  Communication Skills Desire for Improvement Financial Resources/Insurance Housing Leisure Time Resilience Talents/Skills Transportation Vocational/Educational  ADL's:  unable to assess  Cognition:  WNL  Sleep:         Assessment and Plan:  GAD; MDD- recurrent, severe with  psychotic features; Panic disorder without agoraphobia; PTSD  Status of current symptoms: stable  Zoloft 100mg  po qD  Risperdal 1mg  po qHS  She had recent labs done with her PCP and states it was all normal. 07/23/19 labs show glu 136, platelets 85  Ordered EKG  Follow Up Instructions: In 3-4 months or sooner if needed   I discussed the assessment and treatment plan with the patient. The patient was provided an opportunity to ask questions and all were answered. The patient agreed with the plan and demonstrated an understanding of the instructions.   The patient was advised to call back or seek an in-person evaluation if the symptoms worsen or if the condition fails to improve as anticipated.  I provided 20 minutes of non-face-to-face time during this encounter.   Charlcie Cradle, MD

## 2019-08-21 ENCOUNTER — Ambulatory Visit (HOSPITAL_COMMUNITY)
Admission: RE | Admit: 2019-08-21 | Discharge: 2019-08-21 | Disposition: A | Discharge status: Home / Self Care | Payer: PPO | Source: Ambulatory Visit | Attending: Psychiatry | Admitting: Psychiatry

## 2019-08-21 ENCOUNTER — Other Ambulatory Visit: Payer: Self-pay

## 2019-08-21 DIAGNOSIS — R001 Bradycardia, unspecified: Secondary | ICD-10-CM | POA: Diagnosis not present

## 2019-08-21 DIAGNOSIS — Z5181 Encounter for therapeutic drug level monitoring: Secondary | ICD-10-CM | POA: Insufficient documentation

## 2019-08-30 ENCOUNTER — Telehealth: Payer: Self-pay

## 2019-08-30 NOTE — Telephone Encounter (Signed)
Patient called states she can not afford medication. After review of chart LPN made patient aware that she was approved for copay assistance. Pharmacy called and gave [name] information and patient will have $0 copay. Patient called back and made aware. Brooke Huber

## 2019-11-08 ENCOUNTER — Ambulatory Visit
Admission: RE | Admit: 2019-11-08 | Discharge: 2019-11-08 | Disposition: A | Discharge status: Home / Self Care | Payer: PPO | Source: Ambulatory Visit | Attending: Internal Medicine | Admitting: Internal Medicine

## 2019-11-08 DIAGNOSIS — Z9189 Other specified personal risk factors, not elsewhere classified: Secondary | ICD-10-CM

## 2019-11-08 DIAGNOSIS — B181 Chronic viral hepatitis B without delta-agent: Secondary | ICD-10-CM

## 2019-11-08 DIAGNOSIS — B1910 Unspecified viral hepatitis B without hepatic coma: Secondary | ICD-10-CM | POA: Diagnosis not present

## 2019-11-28 ENCOUNTER — Other Ambulatory Visit: Payer: Self-pay

## 2019-11-28 ENCOUNTER — Telehealth (INDEPENDENT_AMBULATORY_CARE_PROVIDER_SITE_OTHER): Payer: PPO | Admitting: Psychiatry

## 2019-11-28 DIAGNOSIS — F411 Generalized anxiety disorder: Secondary | ICD-10-CM

## 2019-11-28 DIAGNOSIS — F4310 Post-traumatic stress disorder, unspecified: Secondary | ICD-10-CM

## 2019-11-28 DIAGNOSIS — F333 Major depressive disorder, recurrent, severe with psychotic symptoms: Secondary | ICD-10-CM | POA: Diagnosis not present

## 2019-11-28 DIAGNOSIS — F410 Panic disorder [episodic paroxysmal anxiety] without agoraphobia: Secondary | ICD-10-CM

## 2019-11-28 MED ORDER — SERTRALINE HCL 100 MG PO TABS: 100.0000 mg | ORAL_TABLET | Freq: Every day | ORAL | 1 refills | Status: DC

## 2019-11-28 MED ORDER — RISPERIDONE 1 MG PO TABS: 1.0000 mg | ORAL_TABLET | Freq: Every day | ORAL | 1 refills | Status: DC

## 2019-11-28 NOTE — Progress Notes (Signed)
Virtual Visit via Telephone Note  I connected with Brooke Huber on 11/28/19 at  1:00 PM EDT by telephone and verified that I am speaking with the correct person using two identifiers.  Location: Patient: home Provider: office   I discussed the limitations, risks, security and privacy concerns of performing an evaluation and management service by telephone and the availability of in person appointments. I also discussed with the patient that there may be a patient responsible charge related to this service. The patient expressed understanding and agreed to proceed.   History of Present Illness: Brooke Huber is doing well. She is upset about finding out things about making her Will. She was disappointed to find that her money may not go to her children if she has debt or hospital bills. She has made her funeral arrangements to decrease the burden on her kids. Brooke Huber denies depression. Her sleep is good. She denies overwhelming anxiety. Brooke Huber denies panic attacks since our last visit. She is eating well. She denies SI/HI/AVH. She plans to spend Christmas with her son.     Observations/Objective:  General Appearance: unable to assess  Eye Contact:  unable to assess  Speech:  Clear and Coherent and Normal Rate  Volume:  Normal  Mood:  Euthymic  Affect:  Blunt  Thought Process:  Goal Directed, Linear and Descriptions of Associations: Intact  Orientation:  Full (Time, Place, and Person)  Thought Content:  Logical  Suicidal Thoughts:  No  Homicidal Thoughts:  No  Memory:  Immediate;   Good  Judgement:  Good  Insight:  Good  Psychomotor Activity: unable to assess  Concentration:  Concentration: Good  Recall:  Good  Fund of Knowledge:  Good  Language:  Good  Akathisia:  unable to assess  Handed:  Right  AIMS (if indicated):     Assets:  Communication Skills Desire for Improvement Financial Resources/Insurance Housing Social  Support Talents/Skills Transportation Vocational/Educational  ADL's:  unable to assess  Cognition:  WNL  Sleep:         Assessment and Plan: GAD; MDD- recurrent, severe with psychotic features; Panic disorder without agoraphobia; PTSD  Zoloft 100mg  po qD  Risperdal 1mg  po qHS   Follow Up Instructions: In 6 months or sooner if needed   I discussed the assessment and treatment plan with the patient. The patient was provided an opportunity to ask questions and all were answered. The patient agreed with the plan and demonstrated an understanding of the instructions.   The patient was advised to call back or seek an in-person evaluation if the symptoms worsen or if the condition fails to improve as anticipated.  I provided 15 minutes of non-face-to-face time during this encounter.   Charlcie Cradle, MD

## 2019-12-05 ENCOUNTER — Telehealth (HOSPITAL_COMMUNITY): Payer: PPO | Admitting: Psychiatry

## 2019-12-23 ENCOUNTER — Other Ambulatory Visit: Payer: Self-pay | Admitting: Internal Medicine

## 2019-12-23 DIAGNOSIS — B181 Chronic viral hepatitis B without delta-agent: Secondary | ICD-10-CM

## 2019-12-24 ENCOUNTER — Encounter (HOSPITAL_COMMUNITY): Payer: Self-pay

## 2019-12-24 ENCOUNTER — Inpatient Hospital Stay: Payer: PPO | Attending: Hematology

## 2019-12-24 ENCOUNTER — Other Ambulatory Visit: Payer: Self-pay

## 2019-12-24 ENCOUNTER — Ambulatory Visit (HOSPITAL_COMMUNITY)
Admission: RE | Admit: 2019-12-24 | Discharge: 2019-12-24 | Disposition: A | Discharge status: Home / Self Care | Payer: PPO | Source: Ambulatory Visit | Attending: Hematology | Admitting: Hematology

## 2019-12-24 DIAGNOSIS — C8218 Follicular lymphoma grade II, lymph nodes of multiple sites: Secondary | ICD-10-CM | POA: Insufficient documentation

## 2019-12-24 DIAGNOSIS — M47814 Spondylosis without myelopathy or radiculopathy, thoracic region: Secondary | ICD-10-CM | POA: Diagnosis not present

## 2019-12-24 DIAGNOSIS — I700 Atherosclerosis of aorta: Secondary | ICD-10-CM | POA: Diagnosis not present

## 2019-12-24 DIAGNOSIS — D696 Thrombocytopenia, unspecified: Secondary | ICD-10-CM | POA: Insufficient documentation

## 2019-12-24 DIAGNOSIS — C8290 Follicular lymphoma, unspecified, unspecified site: Secondary | ICD-10-CM | POA: Diagnosis not present

## 2019-12-24 DIAGNOSIS — Z8572 Personal history of non-Hodgkin lymphomas: Secondary | ICD-10-CM | POA: Insufficient documentation

## 2019-12-24 DIAGNOSIS — K7689 Other specified diseases of liver: Secondary | ICD-10-CM | POA: Diagnosis not present

## 2019-12-24 DIAGNOSIS — K820 Obstruction of gallbladder: Secondary | ICD-10-CM | POA: Diagnosis not present

## 2019-12-24 DIAGNOSIS — J984 Other disorders of lung: Secondary | ICD-10-CM | POA: Diagnosis not present

## 2019-12-24 LAB — CBC WITH DIFFERENTIAL/PLATELET
Abs Immature Granulocytes: 0.01 10*3/uL (ref 0.00–0.07)
Basophils Absolute: 0 10*3/uL (ref 0.0–0.1)
Basophils Relative: 1 %
Eosinophils Absolute: 0.1 10*3/uL (ref 0.0–0.5)
Eosinophils Relative: 2 %
HCT: 40.1 % (ref 36.0–46.0)
Hemoglobin: 13.3 g/dL (ref 12.0–15.0)
Immature Granulocytes: 0 %
Lymphocytes Relative: 18 %
Lymphs Abs: 1 10*3/uL (ref 0.7–4.0)
MCH: 31.3 pg (ref 26.0–34.0)
MCHC: 33.2 g/dL (ref 30.0–36.0)
MCV: 94.4 fL (ref 80.0–100.0)
Monocytes Absolute: 0.6 10*3/uL (ref 0.1–1.0)
Monocytes Relative: 12 %
Neutro Abs: 3.8 10*3/uL (ref 1.7–7.7)
Neutrophils Relative %: 67 %
Platelets: 107 10*3/uL — ABNORMAL LOW (ref 150–400)
RBC: 4.25 MIL/uL (ref 3.87–5.11)
RDW: 12.5 % (ref 11.5–15.5)
WBC: 5.5 10*3/uL (ref 4.0–10.5)
nRBC: 0 % (ref 0.0–0.2)

## 2019-12-24 LAB — CMP (CANCER CENTER ONLY)
ALT: 12 U/L (ref 0–44)
AST: 18 U/L (ref 15–41)
Albumin: 3.9 g/dL (ref 3.5–5.0)
Alkaline Phosphatase: 64 U/L (ref 38–126)
Anion gap: 12 (ref 5–15)
BUN: 15 mg/dL (ref 8–23)
CO2: 27 mmol/L (ref 22–32)
Calcium: 9.4 mg/dL (ref 8.9–10.3)
Chloride: 105 mmol/L (ref 98–111)
Creatinine: 1.08 mg/dL — ABNORMAL HIGH (ref 0.44–1.00)
GFR, Estimated: 54 mL/min — ABNORMAL LOW (ref >60)
Glucose, Bld: 72 mg/dL (ref 70–99)
Potassium: 3.6 mmol/L (ref 3.5–5.1)
Sodium: 144 mmol/L (ref 135–145)
Total Bilirubin: 0.4 mg/dL (ref 0.3–1.2)
Total Protein: 6.8 g/dL (ref 6.5–8.1)

## 2019-12-24 LAB — LACTATE DEHYDROGENASE: LDH: 206 U/L — ABNORMAL HIGH (ref 98–192)

## 2020-01-07 ENCOUNTER — Inpatient Hospital Stay: Payer: PPO | Attending: Hematology | Admitting: Hematology

## 2020-01-07 ENCOUNTER — Inpatient Hospital Stay: Payer: PPO

## 2020-01-07 ENCOUNTER — Other Ambulatory Visit: Payer: Self-pay

## 2020-01-07 VITALS — BP 138/71 | HR 65 | Temp 97.8°F | Resp 18 | Ht 63.0 in | Wt 176.1 lb

## 2020-01-07 DIAGNOSIS — M8580 Other specified disorders of bone density and structure, unspecified site: Secondary | ICD-10-CM | POA: Insufficient documentation

## 2020-01-07 DIAGNOSIS — Z79899 Other long term (current) drug therapy: Secondary | ICD-10-CM | POA: Diagnosis not present

## 2020-01-07 DIAGNOSIS — Z8572 Personal history of non-Hodgkin lymphomas: Secondary | ICD-10-CM | POA: Insufficient documentation

## 2020-01-07 DIAGNOSIS — F419 Anxiety disorder, unspecified: Secondary | ICD-10-CM | POA: Diagnosis not present

## 2020-01-07 DIAGNOSIS — K219 Gastro-esophageal reflux disease without esophagitis: Secondary | ICD-10-CM | POA: Insufficient documentation

## 2020-01-07 DIAGNOSIS — F32A Depression, unspecified: Secondary | ICD-10-CM | POA: Insufficient documentation

## 2020-01-07 DIAGNOSIS — C8218 Follicular lymphoma grade II, lymph nodes of multiple sites: Secondary | ICD-10-CM | POA: Diagnosis not present

## 2020-01-07 DIAGNOSIS — D696 Thrombocytopenia, unspecified: Secondary | ICD-10-CM | POA: Insufficient documentation

## 2020-01-07 DIAGNOSIS — Z23 Encounter for immunization: Secondary | ICD-10-CM | POA: Diagnosis not present

## 2020-01-07 NOTE — Progress Notes (Signed)
HEMATOLOGY/ONCOLOGY CLINIC NOTE  Date of Service:  01/07/20    Patient Care Team: Alroy Dust, L.Marlou Sa, MD as PCP - General (Family Medicine)  CHIEF COMPLAINTS/PURPOSE OF CONSULTATION:  F/u for mx of  Follicular Lymphoma  HISTORY OF PRESENTING ILLNESS:    Brooke Huber is a wonderful 74 y.o. female who has been referred to [name] by her PCP, Dr. 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 She presents to the clinic today accompanied by her friend.   She had a yearly screening mammogram on 04/17/17 which found her to have an enlarged axillary lymph nodes. She had a biopsy on 04/28/17 which showed evidence of lymphoma.   Today she notes she feels fine in general with no changes over the last 6 months. She hopes to go to the beach on the 18th of this month with her friend for 5 days.   She notes years ago in the late 1990s she went to donate blood and they told her she had Hep B, but never had liver damage/change or concerning symptoms. She never had been out of the country, tattoo or blood transfusion. She denies any other major comorbidities. She sees a psychiatrist every 4 months for her depression and anxiety. She notes diagnosis like this can trigger her depression but she is working to maintain her mood during this time.   On review of symptoms, pt notes limited ROM of her left arm for the last 2 years with tenderness. She notes arthritis in her hands and possibly in her shoulder. She denies bowel issues or trouble with bowel movements or passing urine.  Interval History:  Brooke Huber returns today regarding her low grade Follicular Lymphoma. The patient's last visit with [name] was on 07/23/2019. The pt reports that she is doing well overall.  The pt reports that she has been well in the interim. Pt has received her COVID19 vaccines but has not gotten the booster. She has gotten the annual flu vaccine.   Of note since the patient's last visit, pt has  had CT C/A/P ([id number]) ([id number]) completed on 12/24/2019 with results revealing "No suspicious lymphadenopathy in the chest, abdomen, or pelvis. Spleen is normal in size. 7 mm ground-glass nodule in the posterior right upper lobe, grossly unchanged."  Lab results (12/24/19) of CBC w/diff and CMP is as follows: all values are WNL except for PLT at 107K, Creatinine at 1.08, GFR Est at 54. 12/24/2019 LDH at 206  On review of systems, pt denies fevers, chills, night sweats, new lumps/bumps, fatigue, abdominal pain, unexpected weight loss, constipation, falls and any other symptoms.   MEDICAL HISTORY:  Past Medical History:  Diagnosis Date  . 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 Cancer [facility])    breast lymphoma with mets  . Depression   . GERD (gastroesophageal reflux disease)   . Hepatitis   . Hepatitis B 02/1995  . Seizures ([place])    hx of seizure in 1993 and 1994 - caused by stress per patient   -Diverticulitis  -Osteopenia  -Acute gastritis   SURGICAL HISTORY: Past Surgical History:  Procedure Laterality Date  . APPENDECTOMY    . COLONOSCOPY WITH PROPOFOL N/A 12/29/2015   Procedure: COLONOSCOPY WITH PROPOFOL;  Surgeon: Garlan Fair, MD;  Location: [facility];  Service: Endoscopy;  Laterality: N/A;  . TONSILLECTOMY AND ADENOIDECTOMY Bilateral 1954  . TUBAL LIGATION      SOCIAL HISTORY: Social History   Socioeconomic History  . Marital  status: Single    Spouse name: Not on file  . Number of children: Not on file  . Years of education: Not on file  . Highest education level: Not on file  Occupational History  . Not on file  Tobacco Use  . Smoking status: Never Smoker  . Smokeless tobacco: Never Used  Vaping Use  . Vaping Use: Never used  Substance and Sexual Activity  . Alcohol use: No  . Drug use: No  . Sexual activity: Never    Birth control/protection: None  Other Topics Concern  . Not on file  Social History Narrative  . Not on file   Social  Determinants of Health   Financial Resource Strain:   . Difficulty of Paying Living Expenses: Not on file  Food Insecurity:   . Worried About Charity fundraiser in the Last Year: Not on file  . Ran Out of Food in the Last Year: Not on file  Transportation Needs:   . Lack of Transportation (Medical): Not on file  . Lack of Transportation (Non-Medical): Not on file  Physical Activity:   . Days of Exercise per Week: Not on file  . Minutes of Exercise per Session: Not on file  Stress:   . Feeling of Stress : Not on file  Social Connections:   . Frequency of Communication with Friends and Family: Not on file  . Frequency of Social Gatherings with Friends and Family: Not on file  . Attends Religious Services: Not on file  . Active Member of Clubs or Organizations: Not on file  . Attends Archivist Meetings: Not on file  . Marital Status: Not on file  Intimate Partner Violence:   . Fear of Current or Ex-Partner: Not on file  . Emotionally Abused: Not on file  . Physically Abused: Not on file  . Sexually Abused: Not on file    FAMILY HISTORY: Family History  Problem Relation Age of Onset  . Alzheimer's disease Sister   . Dementia Sister   . Sexual abuse Sister   . Seizures Sister   . Alcohol abuse Brother   . Alcohol abuse Maternal Uncle   . Drug abuse Cousin   . Drug abuse Son   . Depression Neg Hx     ALLERGIES:  is allergic to penicillins.  MEDICATIONS:  Current Outpatient Medications  Medication Sig Dispense Refill  . Calcium Carb-Cholecalciferol 600-100 MG-UNIT CAPS Take 1 tablet by mouth daily.    . Cholecalciferol (VITAMIN D-400 PO) Take by mouth. 1x/ day    . famotidine (PEPCID) 20 MG tablet Take 20 mg by mouth 2 (two) times daily.    . Multiple Vitamin (MULTIVITAMIN WITH MINERALS) TABS tablet Take 1 tablet by mouth daily. 30 tablet 0  . polyethylene glycol (MIRALAX / GLYCOLAX) 17 g packet Take 17 g by mouth daily. 14 each 0  . risperiDONE (RISPERDAL) 1  MG tablet Take 1 tablet (1 mg total) by mouth at bedtime. 90 tablet 1  . senna-docusate (SENOKOT-S) 8.6-50 MG tablet Take 1 tablet by mouth at bedtime as needed for mild constipation. 30 tablet 0  . sertraline (ZOLOFT) 100 MG tablet Take 1 tablet (100 mg total) by mouth daily. 90 tablet 1  . VEMLIDY 25 MG TABS TAKE 1 TABLET BY MOUTH EVERY DAY 30 tablet 5   No current facility-administered medications for this visit.   Facility-Administered Medications Ordered in Other Visits  Medication Dose Route Frequency Provider Last Rate Last Admin  .  pneumococcal 13-valent conjugate vaccine (PREVNAR 13) injection 0.5 mL  0.5 mL Intramuscular Once Brooke Huber, Cloria Spring, MD        REVIEW OF SYSTEMS:   A 10+ POINT REVIEW OF SYSTEMS WAS OBTAINED including neurology, dermatology, psychiatry, cardiac, respiratory, lymph, extremities, GI, GU, Musculoskeletal, constitutional, breasts, reproductive, HEENT.  All pertinent positives are noted in the HPI.  All others are negative.   PHYSICAL EXAMINATION: ECOG PERFORMANCE STATUS: 1 - Symptomatic but completely ambulatory  Vitals:   01/07/20 1030  BP: 138/71  Pulse: 65  Resp: 18  Temp: 97.8 F (36.6 C)  SpO2: 97%   Filed Weights   01/07/20 1030  Weight: 176 lb 1.6 oz (79.9 kg)   .Body mass index is 31.19 kg/m.   GENERAL:alert, in no acute distress and comfortable SKIN: no acute rashes, no significant lesions EYES: conjunctiva are pink and non-injected, sclera anicteric OROPHARYNX: MMM, no exudates, no oropharyngeal erythema or ulceration NECK: supple, no JVD LYMPH:  no palpable lymphadenopathy in the cervical, axillary or inguinal regions LUNGS: clear to auscultation b/l with normal respiratory effort HEART: regular rate & rhythm ABDOMEN:  normoactive bowel sounds , non tender, not distended. No palpable hepatosplenomegaly.  Extremity: no pedal edema PSYCH: alert & oriented x 3 with fluent speech NEURO: no focal motor/sensory  deficits  LABORATORY DATA:  I have reviewed the data as listed  . CBC Latest Ref Rng & Units 12/24/2019 07/23/2019 01/22/2019  WBC 4.0 - 10.5 K/uL 5.5 3.7(L) 4.4  Hemoglobin 12.0 - 15.0 g/dL 13.3 13.9 13.6  Hematocrit 36 - 46 % 40.1 41.3 40.5  Platelets 150 - 400 K/uL 107(L) 85(L) 126(L)   ANC 200 . CMP Latest Ref Rng & Units 12/24/2019 07/23/2019 01/22/2019  Glucose 70 - 99 mg/dL 72 136(H) 123(H)  BUN 8 - 23 mg/dL 15 16 14   Creatinine 0.44 - 1.00 mg/dL 1.08(H) 1.33(H) 1.19(H)  Sodium 135 - 145 mmol/L 144 141 141  Potassium 3.5 - 5.1 mmol/L 3.6 3.8 3.9  Chloride 98 - 111 mmol/L 105 108 106  CO2 22 - 32 mmol/L 27 24 25   Calcium 8.9 - 10.3 mg/dL 9.4 9.3 9.2  Total Protein 6.5 - 8.1 g/dL 6.8 6.8 6.4(L)  Total Bilirubin 0.3 - 1.2 mg/dL 0.4 0.8 0.4  Alkaline Phos 38 - 126 U/L 64 55 71  AST 15 - 41 U/L 18 20 27   ALT 0 - 44 U/L 12 15 18    Component     Latest Ref Rng & Units 05/02/2017 06/29/2017  HBV DNA SERPL PCR-ACNC     IU/mL 290 HBV DNA not detected  HBV DNA SERPL PCR-LOG IU     log10 IU/mL 2.462 UNABLE TO CALCULATE  Test Info:      Comment Comment   Component     Latest Ref Rng & Units 07/12/2017  HBV DNA SERPL PCR-ACNC     IU/mL HBV DNA not detected  HBV DNA SERPL PCR-LOG IU     log10 IU/mL UNABLE TO CALCULATE  Test Info:      Comment   Component     Latest Ref Rng & Units 05/02/2017  HBV DNA SERPL PCR-ACNC     IU/mL 290  HBV DNA SERPL PCR-LOG IU     log10 IU/mL 2.462  Test Info:      Comment  Hep B Core Ab, IgM     Negative Negative  Hep B Core Ab, Tot     Negative Positive (A)  Hepatitis B Surface Ag  Negative Confirm. indicated  HCV Ab     0.0 - 0.9 s/co ratio <0.1  HBsAg Conf      Positive (A)    PATHOLOGY    Diagnosis 04/25/17  Lymph node, needle/core biopsy, left - FOLLICULAR LYMPHOMA, GRADE 1-2. - SEE ONCOLOGY TABLE. Microscopic Comment LYMPHOMA Histologic type: Non-Hodgkin B-cell lymphoma: Follicular lymphoma. Grade (if applicable): Low  grade (grade 1-2). Flow cytometry: N/A. Immunohistochemical stains: CD20, CD3, CD5, CD10, CD23, CD43, bcl-2, bcl-6, CD138, light chains, Ki-67. Touch preps/imprints: N/A. Comments: Core biopsies reveal effacement by a vaguely nodular infiltrate of atypical lymphocytes. The lymphocytes are predominately small with irregular nuclear contours. There are no diffuse areas of large cells. Immunohistochemistry reveals abundant CD20-positive B-cells. CD3, CD43, and CD5 highlight interfollicular T-cells. Bcl-6 and CD23 reveal numerous small follicles with bcl-6 positive cells extending outside the follicular dendritic networks. The majority of these cells are bcl-2 positive. CD10 reveals only a few small residual germinal center foci. CD138 highlights scattered plasma cells which are polytypic by light chain in situ hybridization. The overall findings are most consistent with a low grade follicular lymphoma. Dr. Isaiah Blakes was notified on 04/27/2017.   RADIOGRAPHIC STUDIES: I have personally reviewed the radiological images as listed and agreed with the findings in the report. CT Abdomen Pelvis Wo Contrast  Result Date: 12/25/2019 CLINICAL DATA:  Follicular lymphoma, chemotherapy complete EXAM: CT CHEST, ABDOMEN AND PELVIS WITHOUT CONTRAST TECHNIQUE: Multidetector CT imaging of the chest, abdomen and pelvis was performed following the standard protocol without IV contrast. COMPARISON:  05/16/2018 FINDINGS: CT CHEST FINDINGS Cardiovascular: Heart is normal in size.  No pericardial effusion. No evidence of thoracic aortic aneurysm. Mild atherosclerotic calcifications of the aortic arch. Mediastinum/Nodes: No suspicious mediastinal or axillary lymphadenopathy. Visualized thyroid is unremarkable. Lungs/Pleura: Mild biapical pleural-parenchymal scarring. 7 mm ground-glass nodule in the posterior right upper lobe (series 6/image 53), previously 6 mm. No suspicious solid nodules. No pleural effusion or pneumothorax.  Musculoskeletal: Mild degenerative changes of the lower thoracic spine. CT ABDOMEN PELVIS FINDINGS Hepatobiliary: Scattered hepatic cysts, measuring up to 3.0 cm in segment 4 (series 2/image 49). Gallbladder is contracted. No intrahepatic or extrahepatic ductal dilatation. Pancreas: Within normal limits. Spleen: Normal in size. Adrenals/Urinary Tract: Adrenal glands are within normal limits. Kidneys are within normal limits. No renal calculi or hydronephrosis. Bladder is underdistended but unremarkable. Stomach/Bowel: Stomach is within normal limits. No evidence of bowel obstruction. Appendix is not discretely visualized. Vascular/Lymphatic: No evidence of abdominal aortic aneurysm. No suspicious abdominopelvic lymphadenopathy. Reproductive: Uterus is grossly unremarkable. Bilateral ovaries are within normal limits. Other: No abdominopelvic ascites. Musculoskeletal: Visualized osseous structures are within normal limits. IMPRESSION: No suspicious lymphadenopathy in the chest, abdomen, or pelvis. Spleen is normal in size. 7 mm ground-glass nodule in the posterior right upper lobe, grossly unchanged. Attention on follow-up is suggested in this patient undergoing cancer surveillance. Otherwise, follow-up CT chest is suggested in 1 year. Electronically Signed   By: Julian Hy M.D.   On: 12/25/2019 10:07   CT Chest Wo Contrast  Result Date: 12/25/2019 CLINICAL DATA:  Follicular lymphoma, chemotherapy complete EXAM: CT CHEST, ABDOMEN AND PELVIS WITHOUT CONTRAST TECHNIQUE: Multidetector CT imaging of the chest, abdomen and pelvis was performed following the standard protocol without IV contrast. COMPARISON:  05/16/2018 FINDINGS: CT CHEST FINDINGS Cardiovascular: Heart is normal in size.  No pericardial effusion. No evidence of thoracic aortic aneurysm. Mild atherosclerotic calcifications of the aortic arch. Mediastinum/Nodes: No suspicious mediastinal or axillary lymphadenopathy. Visualized thyroid is  unremarkable. Lungs/Pleura: Mild  biapical pleural-parenchymal scarring. 7 mm ground-glass nodule in the posterior right upper lobe (series 6/image 53), previously 6 mm. No suspicious solid nodules. No pleural effusion or pneumothorax. Musculoskeletal: Mild degenerative changes of the lower thoracic spine. CT ABDOMEN PELVIS FINDINGS Hepatobiliary: Scattered hepatic cysts, measuring up to 3.0 cm in segment 4 (series 2/image 49). Gallbladder is contracted. No intrahepatic or extrahepatic ductal dilatation. Pancreas: Within normal limits. Spleen: Normal in size. Adrenals/Urinary Tract: Adrenal glands are within normal limits. Kidneys are within normal limits. No renal calculi or hydronephrosis. Bladder is underdistended but unremarkable. Stomach/Bowel: Stomach is within normal limits. No evidence of bowel obstruction. Appendix is not discretely visualized. Vascular/Lymphatic: No evidence of abdominal aortic aneurysm. No suspicious abdominopelvic lymphadenopathy. Reproductive: Uterus is grossly unremarkable. Bilateral ovaries are within normal limits. Other: No abdominopelvic ascites. Musculoskeletal: Visualized osseous structures are within normal limits. IMPRESSION: No suspicious lymphadenopathy in the chest, abdomen, or pelvis. Spleen is normal in size. 7 mm ground-glass nodule in the posterior right upper lobe, grossly unchanged. Attention on follow-up is suggested in this patient undergoing cancer surveillance. Otherwise, follow-up CT chest is suggested in 1 year. Electronically Signed   By: Julian Hy M.D.   On: 12/25/2019 10:07    Screening Mammogram 04/17/17 IMPRESSION:  The New enlarged nodes in both axillae are indeterminate. An [name] is recommended.    ASSESSMENT & PLAN:   Brooke Huber is a 74 y.o. Caucasian female with   1. 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  10/18/17 PET/CT revealed No measurable metabolic activity within previously enlarged cervical, axillary, mediastinal, and abdominal and pelvic lymph nodes ( [place] 1). 2. Findings support complete response to therapy. 3. Spleen is normal size with normal metabolic activity. 4. No evidence of marrow involvement.   The pt reached complete metabolic response after three cycles of Bendamustine/Rituxan and 2 cycles of only Rituxan (due to neutropenia)  05/16/18 CT C/A/P revealed No residual or recurrent lymphadenopathy in the chest, abdomen, or pelvis. 2. Mild splenomegaly, maximum span 13.2 cm, significantly decreased in size compared to prior examinations. 3. Stable incidental 7 mm ground-glass opacity of the right upper lobe (series 6, image 54). Attention on follow-up.  2. Mild thrombocytopenia-- likely from lymphoma. ? BM involvement vs Immune thrombocytopenia.+ chemotherapy PLT improved to 42k   3. Chronic active Hepatitis B -Hepatitis B labs from 05/02/17 revealed chronic active disease which will be a limiting factor in her treatment.  -Pt completed Tenofovir AF with ID, Dr Linus Salmons -Tenofovir has successfully suppressed her Hep B virus as of 06/29/17, 07/24/2017, 10/17/2017 and 01/17/18 labs  PLAN: -Discussed pt labwork today, 12/24/19; chronic thrombocytopenia, Hgb & WBC are nml, chemistries are stable, LDH is borderline high. -Discussed 12/24/2019 CT C/A/P ([id number]) ([id number]) which reavealed "No suspicious lymphadenopathy in the chest, abdomen, or pelvis. Spleen is normal in size. 7 mm ground-glass nodule in the posterior right upper lobe, grossly unchanged." -The pt shows no clinical or lab progression of her follicular lymphoma at this time. Will continue to monitor. -Discussed CDC  guidelines regarding the COVID19 booster. Will give in clinic today.  -Continue Hepatocellular cancer screening as per Dr. Henreitta Leber recommendations. -Recommend pt continue to f/u with Dr. Linus Salmons for Hep B management. -Will see  back in 6 months with labs   FOLLOW UP: Covid booster vaccination today RTC with Dr Brooke Huber with labs in 6 months   The total time spent in the appt was 20 minutes and more than 50% was on counseling and direct patient cares.  All of the patient's questions were answered with apparent satisfaction. The patient knows to call the clinic with any problems, questions or concerns.    Brooke Lone MD [place] AAHIVMS [facility] Hematology/Oncology Physician [facility]  (Office):       [phone] (Work cell):  [phone] (Fax):           [phone]  01/07/2020 11:04 AM  I, Brooke Huber, am acting as a scribe for Dr. Sullivan Huber.   .I have reviewed the above documentation for accuracy and completeness, and I agree with the above. Brooke Genera MD

## 2020-01-13 ENCOUNTER — Inpatient Hospital Stay: Payer: PPO

## 2020-01-13 ENCOUNTER — Other Ambulatory Visit: Payer: Self-pay

## 2020-01-13 DIAGNOSIS — Z23 Encounter for immunization: Secondary | ICD-10-CM

## 2020-01-13 NOTE — Progress Notes (Signed)
   Covid-19 Vaccination Clinic  Name:  Brooke Huber    MRN: [id number] DOB: [birth year]  01/13/2020  [name] was observed post Covid-19 immunization for 15 minutes without incident. She was provided with Vaccine Information Sheet and instruction to access the V-Safe system.   [name] was instructed to call 911 with any severe reactions post vaccine: Marland Kitchen Difficulty breathing  . Swelling of face and throat  . A fast heartbeat  . A bad rash all over body  . Dizziness and weakness   Immunizations Administered    Name Date Dose VIS Date Route   Pfizer COVID-19 Vaccine 01/13/2020  9:29 AM 0.3 mL 11/20/2019 Intramuscular   Manufacturer: [employer], [employer]   Lot: [id number]   [place]: [phone]

## 2020-02-13 DIAGNOSIS — H18413 Arcus senilis, bilateral: Secondary | ICD-10-CM | POA: Diagnosis not present

## 2020-02-13 DIAGNOSIS — H25013 Cortical age-related cataract, bilateral: Secondary | ICD-10-CM | POA: Diagnosis not present

## 2020-02-13 DIAGNOSIS — H25043 Posterior subcapsular polar age-related cataract, bilateral: Secondary | ICD-10-CM | POA: Diagnosis not present

## 2020-02-13 DIAGNOSIS — H2512 Age-related nuclear cataract, left eye: Secondary | ICD-10-CM | POA: Diagnosis not present

## 2020-02-13 DIAGNOSIS — H2513 Age-related nuclear cataract, bilateral: Secondary | ICD-10-CM | POA: Diagnosis not present

## 2020-02-26 DIAGNOSIS — H2512 Age-related nuclear cataract, left eye: Secondary | ICD-10-CM | POA: Diagnosis not present

## 2020-02-27 ENCOUNTER — Telehealth: Payer: Self-pay

## 2020-02-27 DIAGNOSIS — H2511 Age-related nuclear cataract, right eye: Secondary | ICD-10-CM | POA: Diagnosis not present

## 2020-02-27 NOTE — Telephone Encounter (Signed)
[facility] Patient Advocate Encounter   I was successful in securing patient a $7500 grant from [employer] (PAF) to provide copayment coverage for [facility].  This will make the out of pocket cost $0.00.     I have spoken with the patient.    The billing information is as follows and has been shared with [employer].          Patient knows to call the office with questions or concerns.  [name], [facility] Patient [facility] Phone: [phone] Fax:  [phone]

## 2020-03-11 DIAGNOSIS — H2511 Age-related nuclear cataract, right eye: Secondary | ICD-10-CM | POA: Diagnosis not present

## 2020-04-07 DIAGNOSIS — H524 Presbyopia: Secondary | ICD-10-CM | POA: Diagnosis not present

## 2020-04-07 DIAGNOSIS — H52209 Unspecified astigmatism, unspecified eye: Secondary | ICD-10-CM | POA: Diagnosis not present

## 2020-04-07 DIAGNOSIS — H5213 Myopia, bilateral: Secondary | ICD-10-CM | POA: Diagnosis not present

## 2020-04-23 ENCOUNTER — Encounter: Payer: Self-pay | Admitting: Internal Medicine

## 2020-04-23 ENCOUNTER — Ambulatory Visit (INDEPENDENT_AMBULATORY_CARE_PROVIDER_SITE_OTHER): Payer: Medicare HMO | Admitting: Internal Medicine

## 2020-04-23 ENCOUNTER — Other Ambulatory Visit: Payer: Self-pay

## 2020-04-23 ENCOUNTER — Other Ambulatory Visit: Payer: Self-pay | Admitting: Internal Medicine

## 2020-04-23 VITALS — BP 128/80 | HR 76 | Temp 98.0°F | Ht 62.0 in | Wt 172.0 lb

## 2020-04-23 DIAGNOSIS — B181 Chronic viral hepatitis B without delta-agent: Secondary | ICD-10-CM | POA: Diagnosis not present

## 2020-04-23 DIAGNOSIS — Z5181 Encounter for therapeutic drug level monitoring: Secondary | ICD-10-CM | POA: Diagnosis not present

## 2020-04-23 DIAGNOSIS — Z9189 Other specified personal risk factors, not elsewhere classified: Secondary | ICD-10-CM | POA: Diagnosis not present

## 2020-04-23 DIAGNOSIS — R69 Illness, unspecified: Secondary | ICD-10-CM | POA: Diagnosis not present

## 2020-04-23 MED ORDER — VEMLIDY 25 MG PO TABS: 1.0000 | ORAL_TABLET | Freq: Every day | ORAL | 11 refills | Status: DC

## 2020-04-23 NOTE — Assessment & Plan Note (Signed)
Recent cMP with a normal creat.  Will continue to monitor yearly on medication.

## 2020-04-23 NOTE — Progress Notes (Signed)
   Subjective:    Patient ID: Brooke Huber, female    DOB: 07/09/1945, 75 y.o.   MRN: [id number]  HPI Here for follow up of chronic hepatitis B She has a history of lymphoma and had been started on suppressive tenofovir at that time for treatment then developed a flare of hepatitis B after stopping medication.  She was put back on it with a  Plan to remain on it lifelong.  She is having no issues obtaining or tolerating the medicaiton.  Recent LFTs wnl. CT scan including liver without concerns for mass.     Review of Systems  Constitutional: Negative for unexpected weight change.  Gastrointestinal: Negative for diarrhea and nausea.  Skin: Negative for rash.       Objective:   Physical Exam Eyes:     General: No scleral icterus. Cardiovascular:     Rate and Rhythm: Normal rate and regular rhythm.  Pulmonary:     Effort: Pulmonary effort is normal.  Neurological:     Mental Status: She is alert.  Psychiatric:        Mood and Affect: Mood normal.   SH: no alcohol        Assessment & Plan:

## 2020-04-23 NOTE — Assessment & Plan Note (Signed)
Will continue with [place] screening with ultrasound or CT every 6 months.  Next due in about 1-2 months and then will schedule 6 months later.  Follow up yearly.

## 2020-04-23 NOTE — Assessment & Plan Note (Signed)
She is having no issues with Vemlidy and reports good compliance. Will check labs today and she will remain on this lifelong.  rtc in 1 year

## 2020-04-27 LAB — HEPATITIS B DNA, ULTRAQUANTITATIVE, PCR
Hepatitis B DNA (Calc): <1 Log IU/mL
Hepatitis B DNA: <10 IU/mL

## 2020-04-27 LAB — HEPATITIS B SURFACE ANTIGEN: Hepatitis B Surface Ag: REACTIVE — AB

## 2020-04-29 ENCOUNTER — Ambulatory Visit: Payer: PPO | Admitting: Internal Medicine

## 2020-05-07 ENCOUNTER — Telehealth (INDEPENDENT_AMBULATORY_CARE_PROVIDER_SITE_OTHER): Payer: Medicare HMO | Admitting: Psychiatry

## 2020-05-07 ENCOUNTER — Other Ambulatory Visit: Payer: Self-pay

## 2020-05-07 DIAGNOSIS — F410 Panic disorder [episodic paroxysmal anxiety] without agoraphobia: Secondary | ICD-10-CM | POA: Diagnosis not present

## 2020-05-07 DIAGNOSIS — F411 Generalized anxiety disorder: Secondary | ICD-10-CM

## 2020-05-07 DIAGNOSIS — F4310 Post-traumatic stress disorder, unspecified: Secondary | ICD-10-CM | POA: Diagnosis not present

## 2020-05-07 DIAGNOSIS — F333 Major depressive disorder, recurrent, severe with psychotic symptoms: Secondary | ICD-10-CM

## 2020-05-07 DIAGNOSIS — R69 Illness, unspecified: Secondary | ICD-10-CM | POA: Diagnosis not present

## 2020-05-07 MED ORDER — SERTRALINE HCL 100 MG PO TABS: 100.0000 mg | ORAL_TABLET | Freq: Every day | ORAL | 1 refills | Status: DC

## 2020-05-07 MED ORDER — RISPERIDONE 1 MG PO TABS: 1.0000 mg | ORAL_TABLET | Freq: Every day | ORAL | 1 refills | Status: DC

## 2020-05-07 NOTE — Progress Notes (Signed)
Virtual Visit via Telephone Note  I connected with Brooke Huber on 05/07/20 at 11:30 AM EDT by telephone and verified that I am speaking with the correct person using two identifiers.  Location: Patient: home Provider: office   I discussed the limitations, risks, security and privacy concerns of performing an evaluation and management service by telephone and the availability of in person appointments. I also discussed with the patient that there may be a patient responsible charge related to this service. The patient expressed understanding and agreed to proceed.   History of Present Illness: "I am doing just fine, thank  You". She has been social and active with her family and friends.  Her depression is stable. She has left frequent crying spells regarding her brother's passing. This happens about 1-2 months. Her sleep is good. She denies SI/HI. Her anxiety is a little elevated. She is going to [place] at the end of the month to visit family. She is nervous about flying and traveling alone for the 1st time. Overall her anxiety is mild and situational. She denies any panic attacks. Brooke Huber denies any PTSD symptoms. She has a cancer check up in June 2022.      Observations/Objective:  General Appearance: unable to assess  Eye Contact:  unable to assess  Speech:  Clear and Coherent and Normal Rate  Volume:  Normal  Mood:  Euthymic  Affect:  Full Range  Thought Process:  Goal Directed, Linear and Descriptions of Associations: Intact  Orientation:  Full (Time, Place, and Person)  Thought Content:  Logical  Suicidal Thoughts:  No  Homicidal Thoughts:  No  Memory:  Immediate;   Good  Judgement:  Good  Insight:  Good  Psychomotor Activity: unable to assess  Concentration:  Concentration: Good  Recall:  Good  Fund of Knowledge:  Good  Language:  Good  Akathisia:  unable to assess  Handed:  Right  AIMS (if indicated):     Assets:  Communication Skills Desire for  Improvement Financial Resources/Insurance Housing Resilience Social Support Talents/Skills Transportation Vocational/Educational  ADL's:  unable to assess  Cognition:  WNL  Sleep:        Assessment and Plan:  Depression screen [facility] 2/9 05/07/2020 04/23/2020 11/07/2018 07/31/2018 01/17/2018  Decreased Interest 0 0 0 0 0  Down, Depressed, Hopeless 0 0 0 0 0  PHQ - 2 Score 0 0 0 0 0    Flowsheet Row Video Visit from 05/07/2020 in [place] No Risk     1. Severe episode of recurrent major depressive disorder, with psychotic features ([place]) - risperiDONE (RISPERDAL) 1 MG tablet; Take 1 tablet (1 mg total) by mouth at bedtime.  Dispense: 90 tablet; Refill: 1 - sertraline (ZOLOFT) 100 MG tablet; Take 1 tablet (100 mg total) by mouth daily.  Dispense: 90 tablet; Refill: 1  2. GAD (generalized anxiety disorder) - risperiDONE (RISPERDAL) 1 MG tablet; Take 1 tablet (1 mg total) by mouth at bedtime.  Dispense: 90 tablet; Refill: 1 - sertraline (ZOLOFT) 100 MG tablet; Take 1 tablet (100 mg total) by mouth daily.  Dispense: 90 tablet; Refill: 1  3. PTSD (post-traumatic stress disorder) - risperiDONE (RISPERDAL) 1 MG tablet; Take 1 tablet (1 mg total) by mouth at bedtime.  Dispense: 90 tablet; Refill: 1  4. Panic disorder without agoraphobia - sertraline (ZOLOFT) 100 MG tablet; Take 1 tablet (100 mg total) by mouth daily.  Dispense: 90 tablet; Refill: 1  I reviewed labs  done on 12/24/19  CBC wnl CMP wnl  08/21/19 EKG: QTc 426, sinus bradycardia   Follow Up Instructions: In 5-6 months or sooner if needed    I discussed the assessment and treatment plan with the patient. The patient was provided an opportunity to ask questions and all were answered. The patient agreed with the plan and demonstrated an understanding of the instructions.   The patient was advised to call back or seek an in-person evaluation if the symptoms worsen  or if the condition fails to improve as anticipated.  I provided 9 minutes of non-face-to-face time during this encounter.   Charlcie Cradle, MD

## 2020-05-12 ENCOUNTER — Ambulatory Visit
Admission: RE | Admit: 2020-05-12 | Discharge: 2020-05-12 | Disposition: A | Discharge status: Home / Self Care | Payer: Medicare HMO | Source: Ambulatory Visit | Attending: Internal Medicine | Admitting: Internal Medicine

## 2020-05-12 DIAGNOSIS — K7689 Other specified diseases of liver: Secondary | ICD-10-CM | POA: Diagnosis not present

## 2020-05-12 DIAGNOSIS — B181 Chronic viral hepatitis B without delta-agent: Secondary | ICD-10-CM

## 2020-05-13 ENCOUNTER — Telehealth: Payer: Self-pay

## 2020-05-13 DIAGNOSIS — F419 Anxiety disorder, unspecified: Secondary | ICD-10-CM | POA: Diagnosis not present

## 2020-05-13 DIAGNOSIS — K5900 Constipation, unspecified: Secondary | ICD-10-CM | POA: Diagnosis not present

## 2020-05-13 DIAGNOSIS — R030 Elevated blood-pressure reading, without diagnosis of hypertension: Secondary | ICD-10-CM | POA: Diagnosis not present

## 2020-05-13 DIAGNOSIS — E669 Obesity, unspecified: Secondary | ICD-10-CM | POA: Diagnosis not present

## 2020-05-13 DIAGNOSIS — Z6831 Body mass index (BMI) 31.0-31.9, adult: Secondary | ICD-10-CM | POA: Diagnosis not present

## 2020-05-13 DIAGNOSIS — K219 Gastro-esophageal reflux disease without esophagitis: Secondary | ICD-10-CM | POA: Diagnosis not present

## 2020-05-13 DIAGNOSIS — M1990 Unspecified osteoarthritis, unspecified site: Secondary | ICD-10-CM | POA: Diagnosis not present

## 2020-05-13 DIAGNOSIS — R69 Illness, unspecified: Secondary | ICD-10-CM | POA: Diagnosis not present

## 2020-05-13 DIAGNOSIS — C8590 Non-Hodgkin lymphoma, unspecified, unspecified site: Secondary | ICD-10-CM | POA: Diagnosis not present

## 2020-05-13 NOTE — Telephone Encounter (Signed)
Called patient to relay test results from abdominal ultrasound. Per Dr. Linus Salmons, [name] came back clear with nothing of concern noted. Forwarding to triage to follow up. Voicemail was left to return call.  Velia Pamer Lorita Officer, RN

## 2020-05-13 NOTE — Telephone Encounter (Signed)
-----   Message from Thayer Headings, MD sent at 05/13/2020  4:38 PM EDT ----- Please let her know her ultrasound for [facility] screening looks fine, no concerns.  thanks

## 2020-05-27 ENCOUNTER — Telehealth: Payer: Self-pay | Admitting: Hematology

## 2020-05-27 NOTE — Telephone Encounter (Signed)
R/s per 12/07 los , Left message

## 2020-07-01 ENCOUNTER — Telehealth: Payer: Self-pay

## 2020-07-01 DIAGNOSIS — L309 Dermatitis, unspecified: Secondary | ICD-10-CM | POA: Diagnosis not present

## 2020-07-01 NOTE — Telephone Encounter (Signed)
Patient called, she is having difficulty getting her Vemlidy from the pharmacy. RN called [facility], they state there is a problem with the patient's copay card/COB coupon. RN confirmed PCN/BIN numbers that pharmacy has on file with ones from patient's chart.   [facility] states patient needs to call [phone], provided this phone number to patient and advised her our pharmacy team will have to look into the issue. Patient verbalized understanding and has no further questions.   Beryle Flock, RN

## 2020-07-03 ENCOUNTER — Telehealth: Payer: Self-pay

## 2020-07-03 NOTE — Telephone Encounter (Signed)
Voicemail in triage requesting call back from patient. Call returned, not answered, and no voicemail left as patient did not have SECURE voicemail set up. Brooke Huber

## 2020-07-06 NOTE — Telephone Encounter (Signed)
I spoke to patient and she told me everything was good to go for her to pick up her prescription.

## 2020-07-08 ENCOUNTER — Other Ambulatory Visit: Payer: PPO

## 2020-07-08 ENCOUNTER — Ambulatory Visit: Payer: PPO | Admitting: Hematology

## 2020-07-09 DIAGNOSIS — Z1231 Encounter for screening mammogram for malignant neoplasm of breast: Secondary | ICD-10-CM | POA: Diagnosis not present

## 2020-07-09 DIAGNOSIS — M85852 Other specified disorders of bone density and structure, left thigh: Secondary | ICD-10-CM | POA: Diagnosis not present

## 2020-07-09 DIAGNOSIS — Z780 Asymptomatic menopausal state: Secondary | ICD-10-CM | POA: Diagnosis not present

## 2020-07-09 DIAGNOSIS — M85851 Other specified disorders of bone density and structure, right thigh: Secondary | ICD-10-CM | POA: Diagnosis not present

## 2020-07-27 DIAGNOSIS — E669 Obesity, unspecified: Secondary | ICD-10-CM | POA: Diagnosis not present

## 2020-07-27 DIAGNOSIS — K219 Gastro-esophageal reflux disease without esophagitis: Secondary | ICD-10-CM | POA: Diagnosis not present

## 2020-07-27 DIAGNOSIS — R69 Illness, unspecified: Secondary | ICD-10-CM | POA: Diagnosis not present

## 2020-07-27 DIAGNOSIS — Z23 Encounter for immunization: Secondary | ICD-10-CM | POA: Diagnosis not present

## 2020-07-27 DIAGNOSIS — K5900 Constipation, unspecified: Secondary | ICD-10-CM | POA: Diagnosis not present

## 2020-07-27 DIAGNOSIS — Z13220 Encounter for screening for lipoid disorders: Secondary | ICD-10-CM | POA: Diagnosis not present

## 2020-07-27 DIAGNOSIS — Z0000 Encounter for general adult medical examination without abnormal findings: Secondary | ICD-10-CM | POA: Diagnosis not present

## 2020-07-27 DIAGNOSIS — C8218 Follicular lymphoma grade II, lymph nodes of multiple sites: Secondary | ICD-10-CM | POA: Diagnosis not present

## 2020-07-27 DIAGNOSIS — Z136 Encounter for screening for cardiovascular disorders: Secondary | ICD-10-CM | POA: Diagnosis not present

## 2020-07-27 DIAGNOSIS — M8580 Other specified disorders of bone density and structure, unspecified site: Secondary | ICD-10-CM | POA: Diagnosis not present

## 2020-07-29 ENCOUNTER — Telehealth: Payer: Self-pay | Admitting: Hematology

## 2020-07-29 NOTE — Telephone Encounter (Signed)
R/s appt per 6/29 sch msg. Pt awer.

## 2020-07-31 ENCOUNTER — Other Ambulatory Visit: Payer: Medicare HMO

## 2020-07-31 ENCOUNTER — Ambulatory Visit: Payer: Self-pay | Admitting: Hematology

## 2020-08-26 NOTE — Progress Notes (Signed)
HEMATOLOGY/ONCOLOGY CLINIC NOTE  Date of Service:  08/26/20    Patient Care Team: Alroy Dust, L.Marlou Sa, MD as PCP - General (Family Medicine)  CHIEF COMPLAINTS/PURPOSE OF CONSULTATION:  F/u for mx of  Follicular Lymphoma  HISTORY OF PRESENTING ILLNESS:    Brooke Huber is a wonderful 75 y.o. female who has been referred to [name] by her PCP, Dr. 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 She presents to the clinic today accompanied by her friend.   She had a yearly screening mammogram on 04/17/17 which found her to have an enlarged axillary lymph nodes. She had a biopsy on 04/28/17 which showed evidence of lymphoma.   Today she notes she feels fine in general with no changes over the last 6 months. She hopes to go to the beach on the 18th of this month with her friend for 5 days.   She notes years ago in the late 1990s she went to donate blood and they told her she had Hep B, but never had liver damage/change or concerning symptoms. She never had been out of the country, tattoo or blood transfusion. She denies any other major comorbidities. She sees a psychiatrist every 4 months for her depression and anxiety. She notes diagnosis like this can trigger her depression but she is working to maintain her mood during this time.   On review of symptoms, pt notes limited ROM of her left arm for the last 2 years with tenderness. She notes arthritis in her hands and possibly in her shoulder. She denies bowel issues or trouble with bowel movements or passing urine.  Interval History:  Brooke Huber returns today regarding her low grade Follicular Lymphoma. The patient's last visit with [name] was on 01/07/2020. The pt reports that she is doing well overall.  The pt reports she is okay but has had some depression issues related to having lost her brother recently.  She is seeing behavioral health for counseling.  She notes that she is eating well sleeping well not having  any suicidal or homicidal ideation and is trying to cope with her loss as best as she can. She notes no new lumps or bumps no fevers no chills no night sweats no unexpected loss of weight or significant fatigue.  Lab results today 08/27/2020 of CBC w/diff shows some mild thrombocytopenia at 111k which is stable and likely related to her hep B related liver disease and tenofovir.  Hemoglobin and white blood counts are within normal limits.  CMP is stable.  LDH within normal limits.  On review of systems, pt reports no other acute new focal symptoms.  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   Diagnosed in hands    Cancer [facility])    breast lymphoma with mets   Depression    GERD (gastroesophageal reflux disease)    Hepatitis    Hepatitis B 02/1995   Seizures (HCC)    hx of seizure in 1993 and 1994 - caused by stress per patient   -Diverticulitis  -Osteopenia  -Acute gastritis   SURGICAL HISTORY: Past Surgical History:  Procedure Laterality Date   APPENDECTOMY     COLONOSCOPY WITH PROPOFOL N/A 12/29/2015   Procedure: COLONOSCOPY WITH PROPOFOL;  Surgeon: Garlan Fair, MD;  Location: [facility];  Service: Endoscopy;  Laterality: N/A;   TONSILLECTOMY AND ADENOIDECTOMY Bilateral 1954   TUBAL LIGATION      SOCIAL HISTORY: Social History   Socioeconomic  History   Marital status: Single    Spouse name: Not on file   Number of children: Not on file   Years of education: Not on file   Highest education level: Not on file  Occupational History   Not on file  Tobacco Use   Smoking status: Never   Smokeless tobacco: Never  Vaping Use   Vaping Use: Never used  Substance and Sexual Activity   Alcohol use: No   Drug use: No   Sexual activity: Never    Birth control/protection: None  Other Topics Concern   Not on file  Social History Narrative   Not on file   Social Determinants of Health   Financial Resource Strain: Not on file  Food  Insecurity: Not on file  Transportation Needs: Not on file  Physical Activity: Not on file  Stress: Not on file  Social Connections: Not on file  Intimate Partner Violence: Not on file    FAMILY HISTORY: Family History  Problem Relation Age of Onset   Alzheimer's disease Sister    Dementia Sister    Sexual abuse Sister    Seizures Sister    Alcohol abuse Brother    Alcohol abuse Maternal Uncle    Drug abuse Cousin    Drug abuse Son    Depression Neg Hx     ALLERGIES:  is allergic to penicillins.  MEDICATIONS:  Current Outpatient Medications  Medication Sig Dispense Refill   Calcium Carb-Cholecalciferol 600-100 MG-UNIT CAPS Take 1 tablet by mouth daily.     Cholecalciferol (VITAMIN D-400 PO) Take by mouth. 1x/ day     famotidine (PEPCID) 20 MG tablet Take 20 mg by mouth 2 (two) times daily.     Multiple Vitamin (MULTIVITAMIN WITH MINERALS) TABS tablet Take 1 tablet by mouth daily. 30 tablet 0   polyethylene glycol (MIRALAX / GLYCOLAX) 17 g packet Take 17 g by mouth daily. 14 each 0   risperiDONE (RISPERDAL) 1 MG tablet Take 1 tablet (1 mg total) by mouth at bedtime. 90 tablet 1   senna-docusate (SENOKOT-S) 8.6-50 MG tablet Take 1 tablet by mouth at bedtime as needed for mild constipation. 30 tablet 0   sertraline (ZOLOFT) 100 MG tablet Take 1 tablet (100 mg total) by mouth daily. 90 tablet 1   Tenofovir Alafenamide Fumarate (VEMLIDY) 25 MG TABS Take 1 tablet (25 mg total) by mouth daily. 30 tablet 11   No current facility-administered medications for this visit.   Facility-Administered Medications Ordered in Other Visits  Medication Dose Route Frequency Provider Last Rate Last Admin   pneumococcal 13-valent conjugate vaccine (PREVNAR 13) injection 0.5 mL  0.5 mL Intramuscular Once Brunetta Genera, MD        REVIEW OF SYSTEMS:   10 Point review of Systems was done is negative except as noted above.  PHYSICAL EXAMINATION: ECOG PERFORMANCE STATUS: 1 - Symptomatic but  completely ambulatory  There were no vitals filed for this visit.  There were no vitals filed for this visit.  .There is no height or weight on file to calculate BMI.   No acute distress GENERAL:alert, in no acute distress and comfortable SKIN: no acute rashes, no significant lesions EYES: conjunctiva are pink and non-injected, sclera anicteric OROPHARYNX: MMM, no exudates, no oropharyngeal erythema or ulceration NECK: supple, no JVD LYMPH:  no palpable lymphadenopathy in the cervical, axillary or inguinal regions LUNGS: clear to auscultation b/l with normal respiratory effort HEART: regular rate & rhythm ABDOMEN:  normoactive bowel sounds ,  non tender, not distended. No palpable hepatosplenomegaly.  Extremity: no pedal edema PSYCH: alert & oriented x 3 with fluent speech NEURO: no focal motor/sensory deficits  LABORATORY DATA:  I have reviewed the data as listed  . CBC Latest Ref Rng & Units 08/27/2020 12/24/2019 07/23/2019  WBC 4.0 - 10.5 K/uL 4.4 5.5 3.7(L)  Hemoglobin 12.0 - 15.0 g/dL 13.4 13.3 13.9  Hematocrit 36.0 - 46.0 % 39.8 40.1 41.3  Platelets 150 - 400 K/uL 111(L) 107(L) 85(L)   ANC 200 . CMP Latest Ref Rng & Units 08/27/2020 12/24/2019 07/23/2019  Glucose 70 - 99 mg/dL 110(H) 72 136(H)  BUN 8 - 23 mg/dL 16 15 16   Creatinine 0.44 - 1.00 mg/dL 1.21(H) 1.08(H) 1.33(H)  Sodium 135 - 145 mmol/L 142 144 141  Potassium 3.5 - 5.1 mmol/L 3.7 3.6 3.8  Chloride 98 - 111 mmol/L 107 105 108  CO2 22 - 32 mmol/L 27 27 24   Calcium 8.9 - 10.3 mg/dL 9.5 9.4 9.3  Total Protein 6.5 - 8.1 g/dL 6.8 6.8 6.8  Total Bilirubin 0.3 - 1.2 mg/dL 0.6 0.4 0.8  Alkaline Phos 38 - 126 U/L 64 64 55  AST 15 - 41 U/L 19 18 20   ALT 0 - 44 U/L 10 12 15    . Lab Results  Component Value Date   LDH 146 08/27/2020    Component     Latest Ref Rng & Units 05/02/2017 06/29/2017  HBV DNA SERPL PCR-ACNC     IU/mL 290 HBV DNA not detected  HBV DNA SERPL PCR-LOG IU     log10 IU/mL 2.462 UNABLE TO  CALCULATE  Test Info:      Comment Comment   Component     Latest Ref Rng & Units 07/12/2017  HBV DNA SERPL PCR-ACNC     IU/mL HBV DNA not detected  HBV DNA SERPL PCR-LOG IU     log10 IU/mL UNABLE TO CALCULATE  Test Info:      Comment   Component     Latest Ref Rng & Units 05/02/2017  HBV DNA SERPL PCR-ACNC     IU/mL 290  HBV DNA SERPL PCR-LOG IU     log10 IU/mL 2.462  Test Info:      Comment  Hep B Core Ab, IgM     Negative Negative  Hep B Core Ab, Tot     Negative Positive (A)  Hepatitis B Surface Ag     Negative Confirm. indicated  HCV Ab     0.0 - 0.9 s/co ratio <0.1  HBsAg Conf      Positive (A)    PATHOLOGY    Diagnosis 04/25/17  Lymph node, needle/core biopsy, left - FOLLICULAR LYMPHOMA, GRADE 1-2. - SEE ONCOLOGY TABLE. Microscopic Comment LYMPHOMA Histologic type: Non-Hodgkin B-cell lymphoma: Follicular lymphoma. Grade (if applicable): Low grade (grade 1-2). Flow cytometry: N/A. Immunohistochemical stains: CD20, CD3, CD5, CD10, CD23, CD43, bcl-2, bcl-6, CD138, light chains, Ki-67. Touch preps/imprints: N/A. Comments: Core biopsies reveal effacement by a vaguely nodular infiltrate of atypical lymphocytes. The lymphocytes are predominately small with irregular nuclear contours. There are no diffuse areas of large cells. Immunohistochemistry reveals abundant CD20-positive B-cells. CD3, CD43, and CD5 highlight interfollicular T-cells. Bcl-6 and CD23 reveal numerous small follicles with bcl-6 positive cells extending outside the follicular dendritic networks. The majority of these cells are bcl-2 positive. CD10 reveals only a few small residual germinal center foci. CD138 highlights scattered plasma cells which are polytypic by light chain in situ hybridization. The overall  findings are most consistent with a low grade follicular lymphoma. Dr. Isaiah Blakes was notified on 04/27/2017.   RADIOGRAPHIC STUDIES: I have personally reviewed the radiological images as  listed and agreed with the findings in the report. No results found.   Screening Mammogram 04/17/17 IMPRESSION:  The New enlarged nodes in both axillae are indeterminate. An [name] is recommended.    ASSESSMENT & PLAN:   Brooke Huber is a 75 y.o. Caucasian female with   1. 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  10/18/17 PET/CT revealed No measurable metabolic activity within previously enlarged cervical, axillary, mediastinal, and abdominal and pelvic lymph nodes ( [place] 1). 2. Findings support complete response to therapy. 3. Spleen is normal size with normal metabolic activity. 4. No evidence of marrow involvement.   The pt reached complete metabolic response after three cycles of Bendamustine/Rituxan and 2 cycles of only Rituxan (due to neutropenia)  05/16/18 CT C/A/P revealed No residual or recurrent lymphadenopathy in the chest, abdomen, or pelvis. 2. Mild splenomegaly, maximum span 13.2 cm, significantly decreased in size compared to prior examinations. 3. Stable incidental 7 mm ground-glass opacity of the right upper lobe (series 6, image 54). Attention on follow-up.  2. Mild thrombocytopenia--much worse previously improved significantly when her lymphoma was treated.  Currently mild thrombocytopenia likely from tenofovir and hep B related liver disease   3. Chronic active Hepatitis B -Hepatitis B labs from 05/02/17 revealed chronic active disease which will be a limiting factor in her treatment.  -Last hepatitis B DNA viral load suppressed on 04/23/2020 -Continues to be on tenofovir as per infectious disease PLAN: -Discussed pt labwork today,  08/27/2020; labs are stable mild thrombocytopenia. LDH within normal limits -The pt shows no clinical or lab progression of her follicular lymphoma at this time. Will continue to monitor. --Continue to follow with primary care physician for management of other chronic medical issues. -Continue follow-up with infectious disease for management of chronic active hepatitis B. -Continue follow-up with behavioral health for management of her MDD   FOLLOW UP: RTC with Dr Irene Limbo with labs in 6 months   The total time spent in the appt was 20 minutes and more than 50% was on counseling and direct patient cares.  All of the patient's questions were answered with apparent satisfaction. The patient knows to call the clinic with any problems, questions or concerns.    Sullivan Lone MD [place] AAHIVMS [facility] Hematology/Oncology Physician [facility]  (Office):       [phone] (Work cell):  [phone] (Fax):           [phone]  08/26/2020 8:20 PM  I, Reinaldo Raddle, am acting as scribe for Dr. Sullivan Lone, MD.       .I have reviewed the above documentation for accuracy and completeness, and I agree with the above. Brunetta Genera MD

## 2020-08-27 ENCOUNTER — Inpatient Hospital Stay: Payer: Medicare HMO

## 2020-08-27 ENCOUNTER — Other Ambulatory Visit: Payer: Self-pay

## 2020-08-27 ENCOUNTER — Inpatient Hospital Stay: Payer: Medicare HMO | Attending: Hematology | Admitting: Hematology

## 2020-08-27 VITALS — BP 121/72 | HR 69 | Temp 97.9°F | Resp 17 | Ht 62.0 in | Wt 170.8 lb

## 2020-08-27 DIAGNOSIS — Z9049 Acquired absence of other specified parts of digestive tract: Secondary | ICD-10-CM | POA: Diagnosis not present

## 2020-08-27 DIAGNOSIS — Z79899 Other long term (current) drug therapy: Secondary | ICD-10-CM | POA: Diagnosis not present

## 2020-08-27 DIAGNOSIS — M8580 Other specified disorders of bone density and structure, unspecified site: Secondary | ICD-10-CM | POA: Diagnosis not present

## 2020-08-27 DIAGNOSIS — D696 Thrombocytopenia, unspecified: Secondary | ICD-10-CM | POA: Insufficient documentation

## 2020-08-27 DIAGNOSIS — Z8572 Personal history of non-Hodgkin lymphomas: Secondary | ICD-10-CM | POA: Insufficient documentation

## 2020-08-27 DIAGNOSIS — Z9221 Personal history of antineoplastic chemotherapy: Secondary | ICD-10-CM | POA: Insufficient documentation

## 2020-08-27 DIAGNOSIS — C8218 Follicular lymphoma grade II, lymph nodes of multiple sites: Secondary | ICD-10-CM

## 2020-08-27 LAB — CBC WITH DIFFERENTIAL/PLATELET
Abs Immature Granulocytes: 0.01 10*3/uL (ref 0.00–0.07)
Basophils Absolute: 0 10*3/uL (ref 0.0–0.1)
Basophils Relative: 1 %
Eosinophils Absolute: 0.1 10*3/uL (ref 0.0–0.5)
Eosinophils Relative: 2 %
HCT: 39.8 % (ref 36.0–46.0)
Hemoglobin: 13.4 g/dL (ref 12.0–15.0)
Immature Granulocytes: 0 %
Lymphocytes Relative: 12 %
Lymphs Abs: 0.5 10*3/uL — ABNORMAL LOW (ref 0.7–4.0)
MCH: 30.9 pg (ref 26.0–34.0)
MCHC: 33.7 g/dL (ref 30.0–36.0)
MCV: 91.9 fL (ref 80.0–100.0)
Monocytes Absolute: 0.4 10*3/uL (ref 0.1–1.0)
Monocytes Relative: 10 %
Neutro Abs: 3.4 10*3/uL (ref 1.7–7.7)
Neutrophils Relative %: 75 %
Platelets: 111 10*3/uL — ABNORMAL LOW (ref 150–400)
RBC: 4.33 MIL/uL (ref 3.87–5.11)
RDW: 12.7 % (ref 11.5–15.5)
WBC: 4.4 10*3/uL (ref 4.0–10.5)
nRBC: 0 % (ref 0.0–0.2)

## 2020-08-27 LAB — CMP (CANCER CENTER ONLY)
ALT: 10 U/L (ref 0–44)
AST: 19 U/L (ref 15–41)
Albumin: 3.9 g/dL (ref 3.5–5.0)
Alkaline Phosphatase: 64 U/L (ref 38–126)
Anion gap: 8 (ref 5–15)
BUN: 16 mg/dL (ref 8–23)
CO2: 27 mmol/L (ref 22–32)
Calcium: 9.5 mg/dL (ref 8.9–10.3)
Chloride: 107 mmol/L (ref 98–111)
Creatinine: 1.21 mg/dL — ABNORMAL HIGH (ref 0.44–1.00)
GFR, Estimated: 47 mL/min — ABNORMAL LOW (ref >60)
Glucose, Bld: 110 mg/dL — ABNORMAL HIGH (ref 70–99)
Potassium: 3.7 mmol/L (ref 3.5–5.1)
Sodium: 142 mmol/L (ref 135–145)
Total Bilirubin: 0.6 mg/dL (ref 0.3–1.2)
Total Protein: 6.8 g/dL (ref 6.5–8.1)

## 2020-08-27 LAB — LACTATE DEHYDROGENASE: LDH: 146 U/L (ref 98–192)

## 2020-10-13 ENCOUNTER — Ambulatory Visit
Admission: RE | Admit: 2020-10-13 | Discharge: 2020-10-13 | Disposition: A | Discharge status: Home / Self Care | Payer: Medicare HMO | Source: Ambulatory Visit | Attending: Internal Medicine | Admitting: Internal Medicine

## 2020-10-13 DIAGNOSIS — R69 Illness, unspecified: Secondary | ICD-10-CM | POA: Diagnosis not present

## 2020-10-13 DIAGNOSIS — K7689 Other specified diseases of liver: Secondary | ICD-10-CM | POA: Diagnosis not present

## 2020-10-13 DIAGNOSIS — B181 Chronic viral hepatitis B without delta-agent: Secondary | ICD-10-CM

## 2020-10-29 ENCOUNTER — Other Ambulatory Visit: Payer: Self-pay

## 2020-10-29 ENCOUNTER — Telehealth (HOSPITAL_BASED_OUTPATIENT_CLINIC_OR_DEPARTMENT_OTHER): Payer: Medicare HMO | Admitting: Psychiatry

## 2020-10-29 DIAGNOSIS — F410 Panic disorder [episodic paroxysmal anxiety] without agoraphobia: Secondary | ICD-10-CM | POA: Diagnosis not present

## 2020-10-29 DIAGNOSIS — F333 Major depressive disorder, recurrent, severe with psychotic symptoms: Secondary | ICD-10-CM | POA: Diagnosis not present

## 2020-10-29 DIAGNOSIS — F4310 Post-traumatic stress disorder, unspecified: Secondary | ICD-10-CM | POA: Diagnosis not present

## 2020-10-29 DIAGNOSIS — R69 Illness, unspecified: Secondary | ICD-10-CM | POA: Diagnosis not present

## 2020-10-29 DIAGNOSIS — F411 Generalized anxiety disorder: Secondary | ICD-10-CM

## 2020-10-29 MED ORDER — SERTRALINE HCL 100 MG PO TABS: 100.0000 mg | ORAL_TABLET | Freq: Every day | ORAL | 1 refills | Status: DC

## 2020-10-29 MED ORDER — RISPERIDONE 1 MG PO TABS: 1.0000 mg | ORAL_TABLET | Freq: Every day | ORAL | 1 refills | Status: DC

## 2020-10-29 NOTE — Progress Notes (Signed)
Virtual Visit via Telephone Note  I connected with Brooke Huber on 10/29/20 at 11:30 AM EDT by telephone and verified that I am speaking with the correct person using two identifiers.  Location: Patient: home Provider: office   I discussed the limitations, risks, security and privacy concerns of performing an evaluation and management service by telephone and the availability of in person appointments. I also discussed with the patient that there may be a patient responsible charge related to this service. The patient expressed understanding and agreed to proceed.   History of Present Illness: "Ok, sort of". Her son was in a [place] [employer]. He lost one of kidneys and his spleen. This happened on father's day. He is on 71mo- 46yr medical leave but he went back to work at 2.5 months. He is getting better but he has to be real careful. He is always on her mind. Brooke Huber's anxiety is ok given the stress she is experiencing. She denies any recent panic attacks. Brooke Huber denies depression. She denies anhedonia and hopelessness. She denies SI/HI. Her sleep and appetite are fair. Brooke Huber states her PTSD is ok.    Observations/Objective:  General Appearance: unable to assess  Eye Contact:  unable to assess  Speech:  Clear and Coherent and Slow  Volume:  Normal  Mood:  Euthymic  Affect:  Constricted  Thought Process:  Goal Directed, Linear, and Descriptions of Associations: Intact  Orientation:  Full (Time, Place, and Person)  Thought Content:  Logical  Suicidal Thoughts:  No  Homicidal Thoughts:  No  Memory:  Immediate;   Good  Judgement:  Good  Insight:  Good  Psychomotor Activity: unable to assess  Concentration:  Concentration: Good  Recall:  Good  Fund of Knowledge:  Good  Language:  Good  Akathisia:  unable to assess  Handed:  unable to assess  AIMS (if indicated):     Assets:  Communication Skills Desire for Improvement Financial  Resources/Insurance Housing Resilience Social Support Talents/Skills Vocational/Educational  ADL's:  unable to assess  Cognition:  WNL  Sleep:        Assessment and Plan:  - reviewed labs done on 08/27/20 and she is working with her PCP and specialist regarding these issues:  Glu 110, creatinine 1.21, platelets 111  - next appointment with specialist in January and March 2023. She will get an EKG done.   1. Severe episode of recurrent major depressive disorder, with psychotic features ([place]) - risperiDONE (RISPERDAL) 1 MG tablet; Take 1 tablet (1 mg total) by mouth at bedtime.  Dispense: 90 tablet; Refill: 1 - sertraline (ZOLOFT) 100 MG tablet; Take 1 tablet (100 mg total) by mouth daily.  Dispense: 90 tablet; Refill: 1  2. GAD (generalized anxiety disorder) - risperiDONE (RISPERDAL) 1 MG tablet; Take 1 tablet (1 mg total) by mouth at bedtime.  Dispense: 90 tablet; Refill: 1 - sertraline (ZOLOFT) 100 MG tablet; Take 1 tablet (100 mg total) by mouth daily.  Dispense: 90 tablet; Refill: 1  3. PTSD (post-traumatic stress disorder) - risperiDONE (RISPERDAL) 1 MG tablet; Take 1 tablet (1 mg total) by mouth at bedtime.  Dispense: 90 tablet; Refill: 1  4. Panic disorder without agoraphobia - sertraline (ZOLOFT) 100 MG tablet; Take 1 tablet (100 mg total) by mouth daily.  Dispense: 90 tablet; Refill: 1   Follow Up Instructions: In 5-6 months or sooner if needed   I discussed the assessment and treatment plan with the patient. The patient was provided an  opportunity to ask questions and all were answered. The patient agreed with the plan and demonstrated an understanding of the instructions.   The patient was advised to call back or seek an in-person evaluation if the symptoms worsen or if the condition fails to improve as anticipated.  I provided 14 minutes of non-face-to-face time during this encounter.   Charlcie Cradle, MD

## 2020-11-04 IMAGING — CT CT ABDOMEN AND PELVIS WITH CONTRAST
2 of 4 series · 15 of 46 positions shown, 17 images · IV contrast (APPLIED)
Comparison: PET-CT, 10/18/2017 05/05/2017

CLINICAL DATA: Follow-up follicular lymphoma

EXAM:
CT CHEST, ABDOMEN, AND PELVIS WITH CONTRAST
TECHNIQUE: Multidetector CT imaging of the chest, abdomen and pelvis was
performed following the standard protocol during bolus
administration of intravenous contrast.
CONTRAST:  100mL OMNIPAQUE IOHEXOL 300 MG/ML SOLN, additional oral
enteric contrast

[Series 2: cap with · axial · 0.89mm/px · z∈[-652,-92]mm · 12 of 132 slices shown, 14 images]
[im 10/132  soft-tissue]
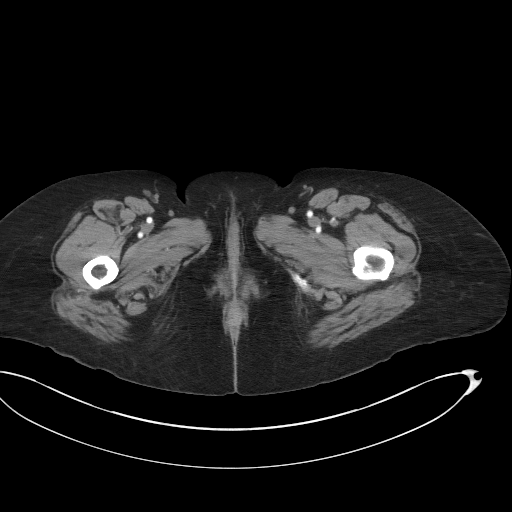
[im 10/132  bone]
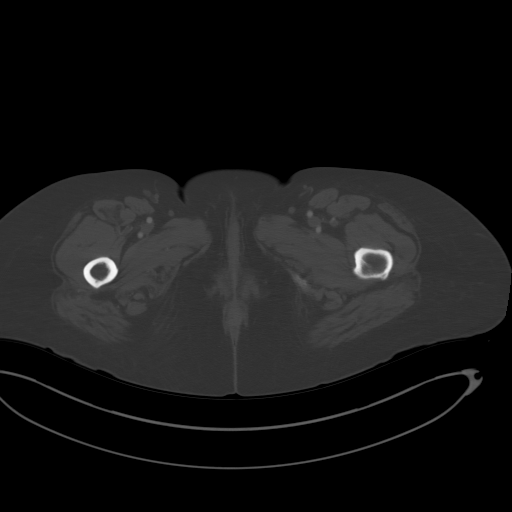
[im 19/132  soft-tissue]
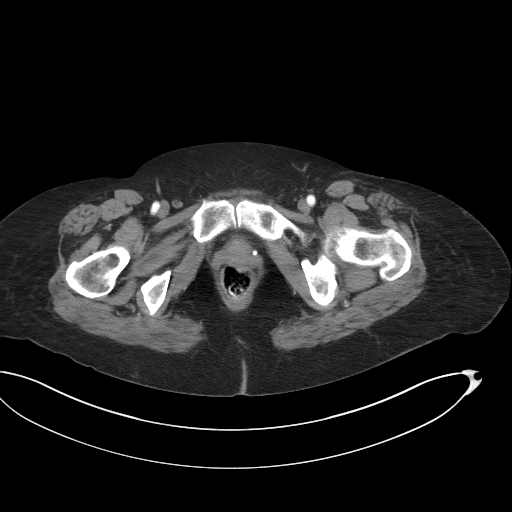
[im 29/132  soft-tissue]
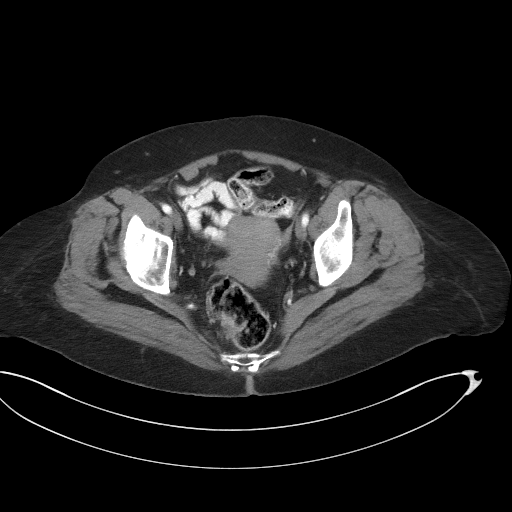
[im 38/132  soft-tissue]
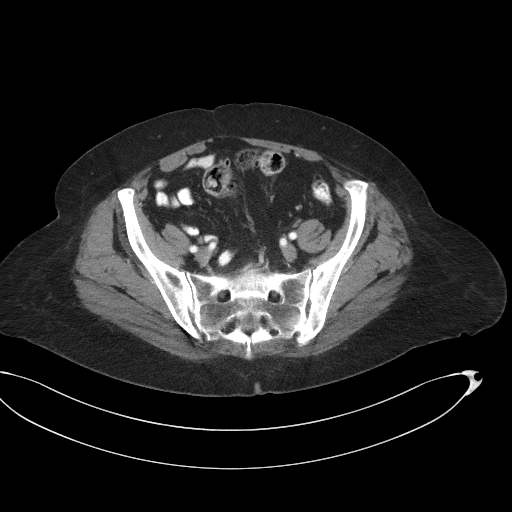
[im 47/132  soft-tissue]
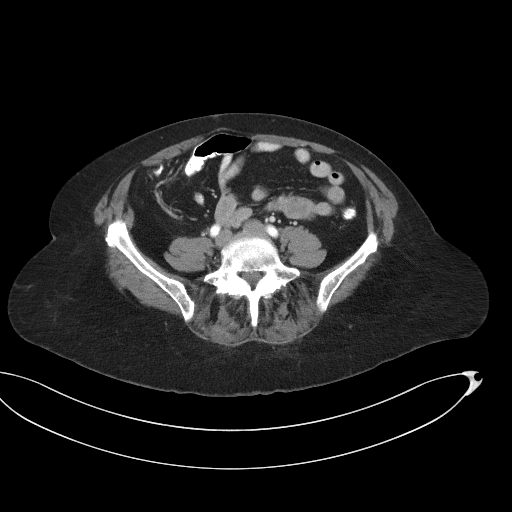
[im 57/132  soft-tissue]
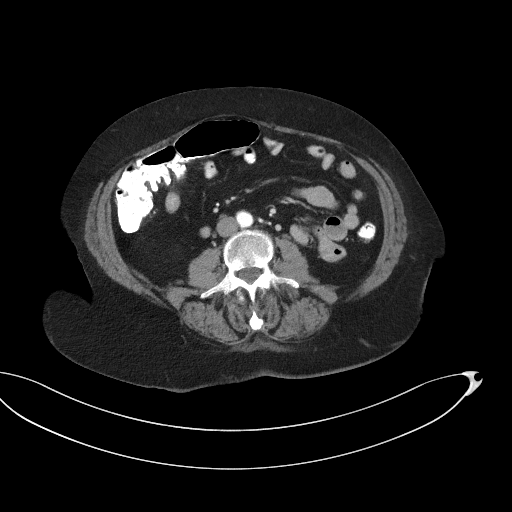
[im 75/132  soft-tissue]
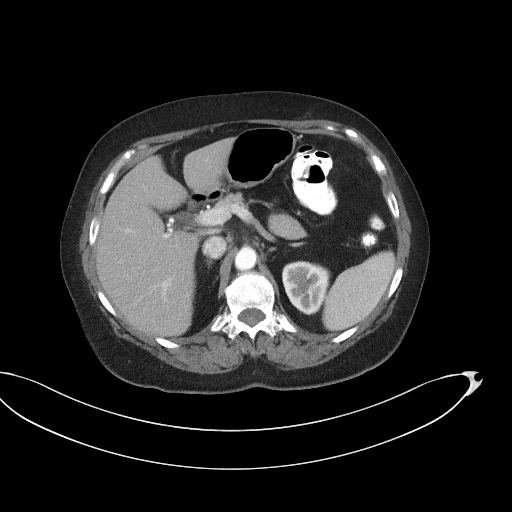
[im 85/132  soft-tissue]
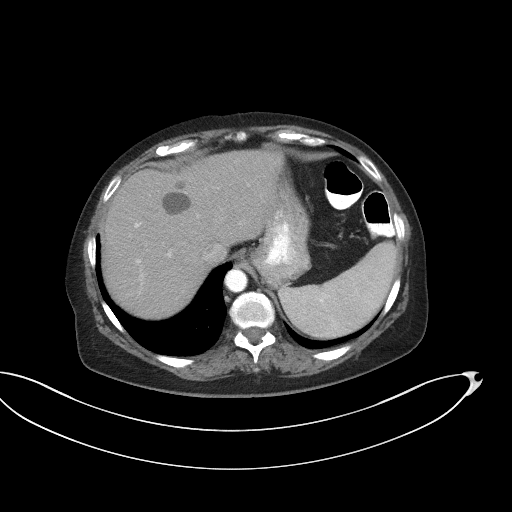
[im 94/132  soft-tissue]
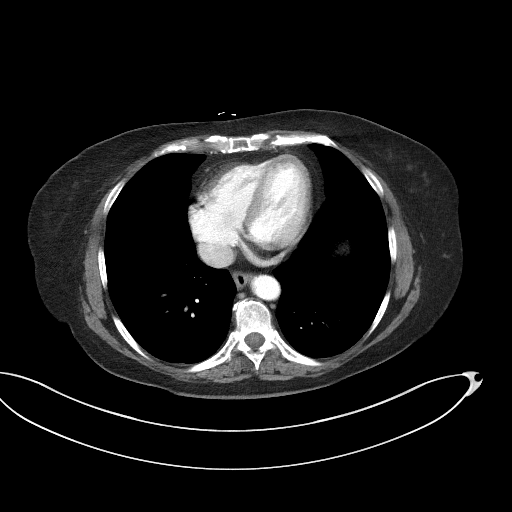
[im 94/132  bone]
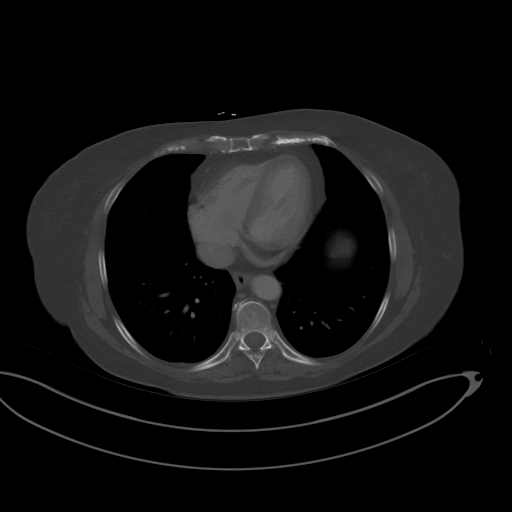
[im 103/132  soft-tissue]
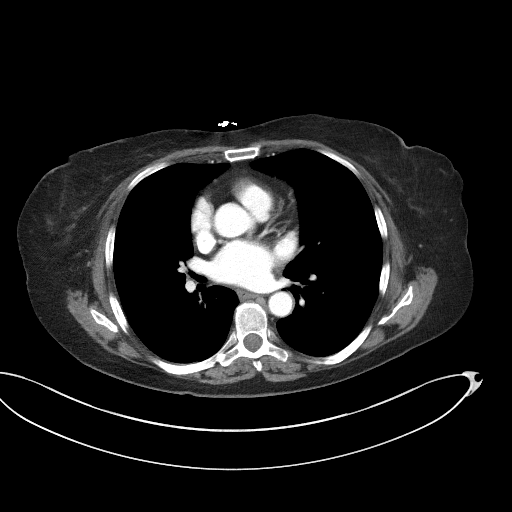
[im 113/132  soft-tissue]
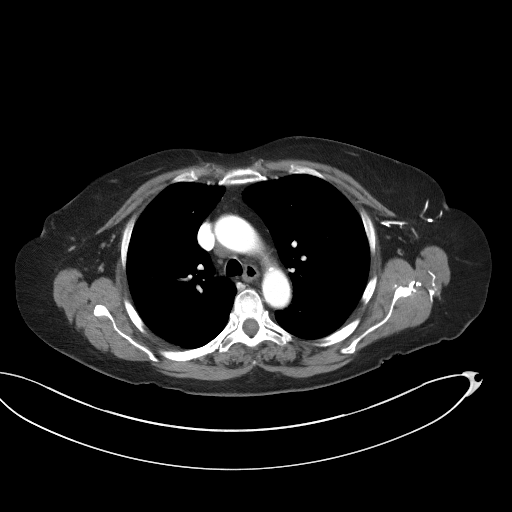
[im 122/132  soft-tissue]
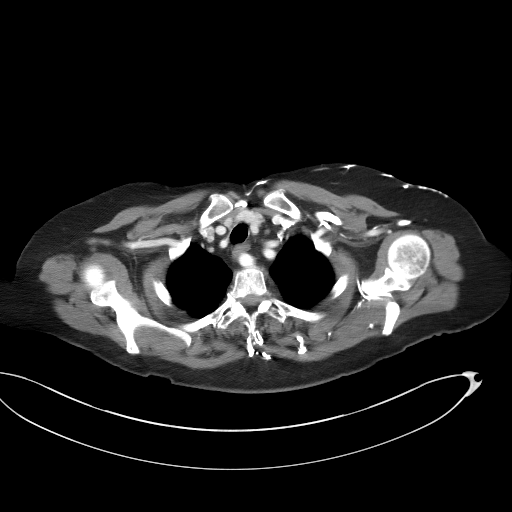

[Series 4: coronals · coronal · 0.82mm/px · 3 of 137 slices shown]
[im 46/137  soft-tissue]
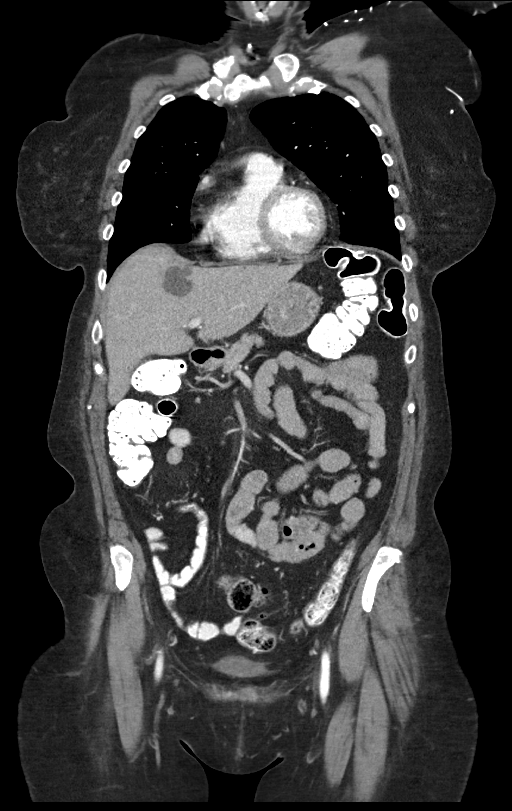
[im 61/137  soft-tissue]
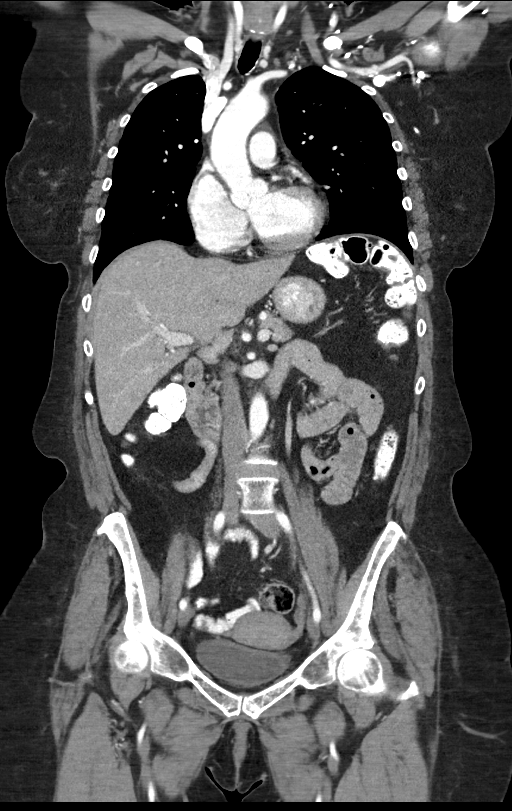
[im 76/137  soft-tissue]
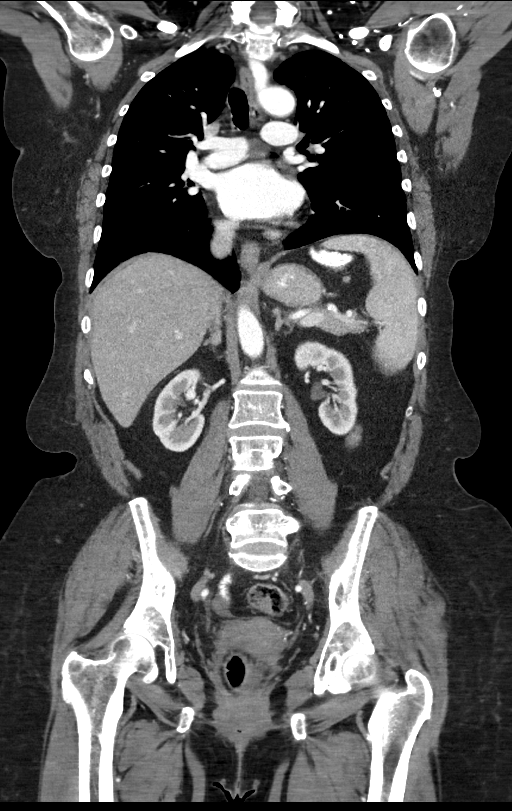

[15 of 46 positions shown; findings below may reference images not displayed]

FINDINGS: CT CHEST FINDINGS

Cardiovascular: Incidental note of aberrant retroesophageal course
of the right subclavian artery. Normal heart size. No pericardial
effusion.

Mediastinum/Nodes: No enlarged mediastinal, hilar, or axillary lymph
nodes. Thyroid gland, trachea, and esophagus demonstrate no
significant findings.

Lungs/Pleura: Stable 7 mm ground-glass opacity of the right upper
lobe (series 6, image 54). No pleural effusion or pneumothorax.

Musculoskeletal: No chest wall mass or suspicious bone lesions
identified.

CT ABDOMEN PELVIS FINDINGS

Hepatobiliary: Low-attenuation cysts of the liver. No gallstones,
gallbladder wall thickening, or biliary dilatation.

Pancreas: Unremarkable. No pancreatic ductal dilatation or
surrounding inflammatory changes.

Spleen: Mild splenomegaly, maximum span 13.2 cm, significantly
decreased in size compared to prior examinations.

Adrenals/Urinary Tract: Adrenal glands are unremarkable. Kidneys are
normal, without renal calculi, focal lesion, or hydronephrosis.
Bladder is unremarkable.

Stomach/Bowel: Stomach is within normal limits. No evidence of bowel
wall thickening, distention, or inflammatory changes. Occasional
sigmoid diverticula.

Vascular/Lymphatic: No significant vascular findings are present. No
enlarged abdominal or pelvic lymph nodes.

Reproductive: No mass or other abnormality.

Other: No abdominal wall hernia or abnormality. No abdominopelvic
ascites.

Musculoskeletal: No acute or significant osseous findings.
IMPRESSION: 1. No residual or recurrent lymphadenopathy in the chest, abdomen,
or pelvis.

2. Mild splenomegaly, maximum span 13.2 cm, significantly decreased
in size compared to prior examinations.

3. Stable incidental 7 mm ground-glass opacity of the right upper
lobe (series 6, image 54). Attention on follow-up.

## 2021-02-04 DIAGNOSIS — H524 Presbyopia: Secondary | ICD-10-CM | POA: Diagnosis not present

## 2021-02-04 DIAGNOSIS — H5203 Hypermetropia, bilateral: Secondary | ICD-10-CM | POA: Diagnosis not present

## 2021-02-04 DIAGNOSIS — H52209 Unspecified astigmatism, unspecified eye: Secondary | ICD-10-CM | POA: Diagnosis not present

## 2021-02-25 ENCOUNTER — Other Ambulatory Visit: Payer: Self-pay | Admitting: *Deleted

## 2021-02-25 DIAGNOSIS — C8218 Follicular lymphoma grade II, lymph nodes of multiple sites: Secondary | ICD-10-CM

## 2021-02-26 ENCOUNTER — Inpatient Hospital Stay: Payer: Medicare HMO | Attending: Hematology | Admitting: Hematology

## 2021-02-26 ENCOUNTER — Other Ambulatory Visit: Payer: Self-pay

## 2021-02-26 ENCOUNTER — Inpatient Hospital Stay: Payer: Medicare HMO

## 2021-02-26 VITALS — BP 138/99 | HR 62 | Temp 97.3°F | Resp 18 | Wt 167.6 lb

## 2021-02-26 DIAGNOSIS — C8218 Follicular lymphoma grade II, lymph nodes of multiple sites: Secondary | ICD-10-CM

## 2021-02-26 DIAGNOSIS — Z8572 Personal history of non-Hodgkin lymphomas: Secondary | ICD-10-CM | POA: Diagnosis not present

## 2021-02-26 DIAGNOSIS — D696 Thrombocytopenia, unspecified: Secondary | ICD-10-CM

## 2021-02-26 LAB — CMP (CANCER CENTER ONLY)
ALT: 9 U/L (ref 0–44)
AST: 16 U/L (ref 15–41)
Albumin: 4.3 g/dL (ref 3.5–5.0)
Alkaline Phosphatase: 60 U/L (ref 38–126)
Anion gap: 6 (ref 5–15)
BUN: 19 mg/dL (ref 8–23)
CO2: 29 mmol/L (ref 22–32)
Calcium: 9.6 mg/dL (ref 8.9–10.3)
Chloride: 107 mmol/L (ref 98–111)
Creatinine: 1.05 mg/dL — ABNORMAL HIGH (ref 0.44–1.00)
GFR, Estimated: 55 mL/min — ABNORMAL LOW (ref >60)
Glucose, Bld: 98 mg/dL (ref 70–99)
Potassium: 4 mmol/L (ref 3.5–5.1)
Sodium: 142 mmol/L (ref 135–145)
Total Bilirubin: 0.6 mg/dL (ref 0.3–1.2)
Total Protein: 6.7 g/dL (ref 6.5–8.1)

## 2021-02-26 LAB — CBC WITH DIFFERENTIAL (CANCER CENTER ONLY)
Abs Immature Granulocytes: 0.01 10*3/uL (ref 0.00–0.07)
Basophils Absolute: 0 10*3/uL (ref 0.0–0.1)
Basophils Relative: 1 %
Eosinophils Absolute: 0.1 10*3/uL (ref 0.0–0.5)
Eosinophils Relative: 1 %
HCT: 40.6 % (ref 36.0–46.0)
Hemoglobin: 13.6 g/dL (ref 12.0–15.0)
Immature Granulocytes: 0 %
Lymphocytes Relative: 10 %
Lymphs Abs: 0.5 10*3/uL — ABNORMAL LOW (ref 0.7–4.0)
MCH: 31.1 pg (ref 26.0–34.0)
MCHC: 33.5 g/dL (ref 30.0–36.0)
MCV: 92.7 fL (ref 80.0–100.0)
Monocytes Absolute: 0.6 10*3/uL (ref 0.1–1.0)
Monocytes Relative: 10 %
Neutro Abs: 4.4 10*3/uL (ref 1.7–7.7)
Neutrophils Relative %: 78 %
Platelet Count: 116 10*3/uL — ABNORMAL LOW (ref 150–400)
RBC: 4.38 MIL/uL (ref 3.87–5.11)
RDW: 12.7 % (ref 11.5–15.5)
WBC Count: 5.7 10*3/uL (ref 4.0–10.5)
nRBC: 0 % (ref 0.0–0.2)

## 2021-02-26 LAB — LACTATE DEHYDROGENASE: LDH: 133 U/L (ref 98–192)

## 2021-03-04 NOTE — Progress Notes (Addendum)
HEMATOLOGY/ONCOLOGY CLINIC NOTE  Date of Service:  .02/26/2021   Patient Care Team: Alroy Dust, L.Marlou Sa, MD as PCP - General (Family Medicine)  CHIEF COMPLAINTS/PURPOSE OF CONSULTATION:  Follow-up for continued evaluation and management of low-grade follicular lymphoma  HISTORY OF PRESENTING ILLNESS:    Interval History:  [name] returns for her 27-monthfollow-up for continued evaluation and management of her low-grade follicular lymphoma. She notes no acute new symptoms since her last clinic visit. No new lumps or bumps. No fevers no chills no night sweats no unexpected weight loss. Continues to follow-up with infectious disease for continued management of her hepatitis B. She continues to follow-up with Dr. SCharlcie Cradlefor continued behavioral health management.  Labs today were reviewed with the patient.  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   Diagnosed in hands    Cancer ([facility]    breast lymphoma with mets   Depression    GERD (gastroesophageal reflux disease)    Hepatitis    Hepatitis B 02/1995   Seizures (HCC)    hx of seizure in 1993 and 1994 - caused by stress per patient   -Diverticulitis  -Osteopenia  -Acute gastritis   SURGICAL HISTORY: Past Surgical History:  Procedure Laterality Date   APPENDECTOMY     COLONOSCOPY WITH PROPOFOL N/A 12/29/2015   Procedure: COLONOSCOPY WITH PROPOFOL;  Surgeon: MGarlan Fair MD;  Location: [facility];  Service: Endoscopy;  Laterality: N/A;   TONSILLECTOMY AND ADENOIDECTOMY Bilateral 1954   TUBAL LIGATION      SOCIAL HISTORY: Social History   Socioeconomic History   Marital status: Single    Spouse name: Not on file   Number of children: Not on file   Years of education: Not on file   Highest education level: Not on file  Occupational History   Not on file  Tobacco Use   Smoking status: Never   Smokeless tobacco: Never  Vaping Use   Vaping Use:  Never used  Substance and Sexual Activity   Alcohol use: No   Drug use: No   Sexual activity: Never    Birth control/protection: None  Other Topics Concern   Not on file  Social History Narrative   Not on file   Social Determinants of Health   Financial Resource Strain: Not on file  Food Insecurity: Not on file  Transportation Needs: Not on file  Physical Activity: Not on file  Stress: Not on file  Social Connections: Not on file  Intimate Partner Violence: Not on file    FAMILY HISTORY: Family History  Problem Relation Age of Onset   Alzheimer's disease Sister    Dementia Sister    Sexual abuse Sister    Seizures Sister    Alcohol abuse Brother    Alcohol abuse Maternal Uncle    Drug abuse Cousin    Drug abuse Son    Depression Neg Hx     ALLERGIES:  is allergic to penicillins.  MEDICATIONS:  Current Outpatient Medications  Medication Sig Dispense Refill   Calcium Carb-Cholecalciferol 600-100 MG-UNIT CAPS Take 1 tablet by mouth daily.     Cholecalciferol (VITAMIN D-400 PO) Take by mouth. 1x/ day     famotidine (PEPCID) 20 MG tablet Take 20 mg by mouth 2 (two) times daily.     Multiple Vitamin (MULTIVITAMIN WITH MINERALS) TABS tablet Take 1 tablet by mouth daily. 30 tablet 0   polyethylene glycol (MIRALAX / GLYCOLAX) 17  g packet Take 17 g by mouth daily. 14 each 0   risperiDONE (RISPERDAL) 1 MG tablet Take 1 tablet (1 mg total) by mouth at bedtime. 90 tablet 1   senna-docusate (SENOKOT-S) 8.6-50 MG tablet Take 1 tablet by mouth at bedtime as needed for mild constipation. 30 tablet 0   sertraline (ZOLOFT) 100 MG tablet Take 1 tablet (100 mg total) by mouth daily. 90 tablet 1   Tenofovir Alafenamide Fumarate (VEMLIDY) 25 MG TABS Take 1 tablet (25 mg total) by mouth daily. 30 tablet 11   No current facility-administered medications for this visit.   Facility-Administered Medications Ordered in Other Visits  Medication Dose Route Frequency Provider Last Rate Last  Admin   pneumococcal 13-valent conjugate vaccine (PREVNAR 13) injection 0.5 mL  0.5 mL Intramuscular Once Brunetta Genera, MD        REVIEW OF SYSTEMS:   .10 Point review of Systems was done is negative except as noted above.  PHYSICAL EXAMINATION: ECOG PERFORMANCE STATUS: 1 - Symptomatic but completely ambulatory  Vitals:   02/26/21 1000  BP: (!) 138/99  Pulse: 62  Resp: 18  Temp: (!) 97.3 F (36.3 C)  SpO2: 97%    Filed Weights   02/26/21 1000  Weight: 167 lb 9.6 oz (76 kg)    .Body mass index is 30.65 kg/m.   GENERAL:alert, in no acute distress and comfortable SKIN: no acute rashes, no significant lesions EYES: conjunctiva are pink and non-injected, sclera anicteric OROPHARYNX: MMM, no exudates, no oropharyngeal erythema or ulceration NECK: supple, no JVD LYMPH:  no palpable lymphadenopathy in the cervical, axillary or inguinal regions LUNGS: clear to auscultation b/l with normal respiratory effort HEART: regular rate & rhythm ABDOMEN:  normoactive bowel sounds , non tender, not distended. Extremity: no pedal edema PSYCH: alert & oriented x 3 with fluent speech NEURO: no focal motor/sensory deficits  LABORATORY DATA:  I have reviewed the data as listed  . CBC Latest Ref Rng & Units 02/26/2021 08/27/2020 12/24/2019  WBC 4.0 - 10.5 K/uL 5.7 4.4 5.5  Hemoglobin 12.0 - 15.0 g/dL 13.6 13.4 13.3  Hematocrit 36.0 - 46.0 % 40.6 39.8 40.1  Platelets 150 - 400 K/uL 116(L) 111(L) 107(L)   ANC 200 . CMP Latest Ref Rng & Units 02/26/2021 08/27/2020 12/24/2019  Glucose 70 - 99 mg/dL 98 110(H) 72  BUN 8 - 23 mg/dL _0 Creatinine 0.44 - 1.00 mg/dL 1.05(H) 1.21(H) 1.08(H)  Sodium 135 - 145 mmol/L 142 142 144  Potassium 3.5 - 5.1 mmol/L 4.0 3.7 3.6  Chloride 98 - 111 mmol/L 107 107 105  CO2 22 - 32 mmol/L _1 Calcium 8.9 - 10.3 mg/dL 9.6 9.5 9.4  Total Protein 6.5 - 8.1 g/dL 6.7 6.8 6.8  Total Bilirubin 0.3 - 1.2 mg/dL 0.6 0.6 0.4  Alkaline Phos 38 - 126  U/L 60 64 64  AST 15 - 41 U/L _2 ALT 0 - 44 U/L _3 . Lab Results  Component Value Date   LDH 133 02/26/2021    Component     Latest Ref Rng & Units 05/02/2017 06/29/2017  HBV DNA SERPL PCR-ACNC     IU/mL 290 HBV DNA not detected  HBV DNA SERPL PCR-LOG IU     log10 IU/mL 2.462 UNABLE TO CALCULATE  Test Info:      Comment Comment   Component     Latest Ref Rng & Units 07/12/2017  HBV DNA  SERPL PCR-ACNC     IU/mL HBV DNA not detected  HBV DNA SERPL PCR-LOG IU     log10 IU/mL UNABLE TO CALCULATE  Test Info:      Comment   Component     Latest Ref Rng & Units 05/02/2017  HBV DNA SERPL PCR-ACNC     IU/mL 290  HBV DNA SERPL PCR-LOG IU     log10 IU/mL 2.462  Test Info:      Comment  Hep B Core Ab, IgM     Negative Negative  Hep B Core Ab, Tot     Negative Positive (A)  Hepatitis B Surface Ag     Negative Confirm. indicated  HCV Ab     0.0 - 0.9 s/co ratio <0.1  HBsAg Conf      Positive (A)    PATHOLOGY    Diagnosis 04/25/17  Lymph node, needle/core biopsy, left - FOLLICULAR LYMPHOMA, GRADE 1-2. - SEE ONCOLOGY TABLE. Microscopic Comment LYMPHOMA Histologic type: Non-Hodgkin B-cell lymphoma: Follicular lymphoma. Grade (if applicable): Low grade (grade 1-2). Flow cytometry: N/A. Immunohistochemical stains: CD20, CD3, CD5, CD10, CD23, CD43, bcl-2, bcl-6, CD138, light chains, Ki-67. Touch preps/imprints: N/A. Comments: Core biopsies reveal effacement by a vaguely nodular infiltrate of atypical lymphocytes. The lymphocytes are predominately small with irregular nuclear contours. There are no diffuse areas of large cells. Immunohistochemistry reveals abundant CD20-positive B-cells. CD3, CD43, and CD5 highlight interfollicular T-cells. Bcl-6 and CD23 reveal numerous small follicles with bcl-6 positive cells extending outside the follicular dendritic networks. The majority of these cells are bcl-2 positive. CD10 reveals only a few small residual germinal center  foci. CD138 highlights scattered plasma cells which are polytypic by light chain in situ hybridization. The overall findings are most consistent with a low grade follicular lymphoma. Dr. Isaiah Blakes was notified on 04/27/2017.   RADIOGRAPHIC STUDIES: I have personally reviewed the radiological images as listed and agreed with the findings in the report. No results found.   Screening Mammogram 04/17/17 IMPRESSION:  The New enlarged nodes in both axillae are indeterminate. An [name] is recommended.    ASSESSMENT & PLAN:   Brooke Huber is a 76 y.o. Caucasian female with   1. 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  10/18/17 PET/CT revealed No measurable metabolic activity within previously enlarged cervical, axillary, mediastinal, and abdominal and pelvic lymph nodes ( [place] 1). 2. Findings support complete response to therapy. 3. Spleen is normal size with normal metabolic activity. 4. No evidence of marrow involvement.   The pt reached complete metabolic response after three cycles of Bendamustine/Rituxan and 2 cycles of only Rituxan (due to neutropenia)  05/16/18 CT C/A/P revealed No residual or recurrent lymphadenopathy in the chest, abdomen, or pelvis. 2. Mild splenomegaly, maximum span 13.2 cm, significantly decreased in size compared to prior examinations.   2. Mild thrombocytopenia--much worse previously improved significantly when her lymphoma was treated.  Currently mild thrombocytopenia likely from tenofovir and hep B related liver disease   3. Chronic active Hepatitis B -Hepatitis B labs from 05/02/17 revealed chronic active disease which will be a  limiting factor in her treatment.  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  -CMP stable creatinine 1.05 -LDH within normal limits at 133 -Patient has no clinical or lab evidence of follicular lymphoma progression at this time. -No noticeable toxicities from her previous chemoimmunotherapy. -She prefers to hold off additional imaging at this time in the absence of new symptoms. -We will continue active surveillance with labs in 6 months. -Continue follow-up with infectious disease and behavioral health as per their recommendations.  FOLLOW UP: RTC with Dr Irene Limbo with labs in 6 months  All of the patient's questions were answered with apparent satisfaction. The patient knows to call the clinic with any problems, questions or concerns.    Sullivan Lone MD [place] AAHIVMS [facility] Hematology/Oncology Physician [facility]

## 2021-03-23 ENCOUNTER — Other Ambulatory Visit (HOSPITAL_COMMUNITY): Payer: Self-pay

## 2021-03-23 ENCOUNTER — Telehealth: Payer: Self-pay

## 2021-03-23 NOTE — Telephone Encounter (Signed)
[facility] Patient Advocate Encounter   I was successful in securing patient a $7500.00 grant from Patient [place] (PAF) to provide copayment coverage for [facility].  This will make the out of pocket cost $0.00.     I have spoken with the patient.         Dates of Eligibility: 03/24/21 through 03/24/22  Patient knows to call the office with questions or concerns.  [name], [facility] Patient [facility] for Infectious Disease Phone: [phone] Fax:  [phone]

## 2021-03-24 ENCOUNTER — Other Ambulatory Visit (HOSPITAL_COMMUNITY): Payer: Self-pay

## 2021-03-25 DIAGNOSIS — G5601 Carpal tunnel syndrome, right upper limb: Secondary | ICD-10-CM | POA: Diagnosis not present

## 2021-03-25 DIAGNOSIS — C8218 Follicular lymphoma grade II, lymph nodes of multiple sites: Secondary | ICD-10-CM | POA: Diagnosis not present

## 2021-03-31 DIAGNOSIS — E669 Obesity, unspecified: Secondary | ICD-10-CM | POA: Diagnosis not present

## 2021-03-31 DIAGNOSIS — R69 Illness, unspecified: Secondary | ICD-10-CM | POA: Diagnosis not present

## 2021-03-31 DIAGNOSIS — R030 Elevated blood-pressure reading, without diagnosis of hypertension: Secondary | ICD-10-CM | POA: Diagnosis not present

## 2021-03-31 DIAGNOSIS — M8580 Other specified disorders of bone density and structure, unspecified site: Secondary | ICD-10-CM | POA: Diagnosis not present

## 2021-03-31 DIAGNOSIS — Z008 Encounter for other general examination: Secondary | ICD-10-CM | POA: Diagnosis not present

## 2021-03-31 DIAGNOSIS — G8929 Other chronic pain: Secondary | ICD-10-CM | POA: Diagnosis not present

## 2021-03-31 DIAGNOSIS — M1990 Unspecified osteoarthritis, unspecified site: Secondary | ICD-10-CM | POA: Diagnosis not present

## 2021-03-31 DIAGNOSIS — K219 Gastro-esophageal reflux disease without esophagitis: Secondary | ICD-10-CM | POA: Diagnosis not present

## 2021-03-31 DIAGNOSIS — F4310 Post-traumatic stress disorder, unspecified: Secondary | ICD-10-CM | POA: Diagnosis not present

## 2021-03-31 DIAGNOSIS — K5900 Constipation, unspecified: Secondary | ICD-10-CM | POA: Diagnosis not present

## 2021-03-31 DIAGNOSIS — Z6830 Body mass index (BMI) 30.0-30.9, adult: Secondary | ICD-10-CM | POA: Diagnosis not present

## 2021-03-31 DIAGNOSIS — F411 Generalized anxiety disorder: Secondary | ICD-10-CM | POA: Diagnosis not present

## 2021-04-15 ENCOUNTER — Other Ambulatory Visit: Payer: Self-pay

## 2021-04-15 ENCOUNTER — Telehealth (HOSPITAL_BASED_OUTPATIENT_CLINIC_OR_DEPARTMENT_OTHER): Payer: Medicare HMO | Admitting: Psychiatry

## 2021-04-15 ENCOUNTER — Encounter (HOSPITAL_COMMUNITY): Payer: Self-pay | Admitting: Psychiatry

## 2021-04-15 DIAGNOSIS — F411 Generalized anxiety disorder: Secondary | ICD-10-CM | POA: Diagnosis not present

## 2021-04-15 DIAGNOSIS — F410 Panic disorder [episodic paroxysmal anxiety] without agoraphobia: Secondary | ICD-10-CM

## 2021-04-15 DIAGNOSIS — F4310 Post-traumatic stress disorder, unspecified: Secondary | ICD-10-CM

## 2021-04-15 DIAGNOSIS — Z79899 Other long term (current) drug therapy: Secondary | ICD-10-CM | POA: Diagnosis not present

## 2021-04-15 DIAGNOSIS — F333 Major depressive disorder, recurrent, severe with psychotic symptoms: Secondary | ICD-10-CM

## 2021-04-15 DIAGNOSIS — R69 Illness, unspecified: Secondary | ICD-10-CM | POA: Diagnosis not present

## 2021-04-15 MED ORDER — SERTRALINE HCL 100 MG PO TABS: 100.0000 mg | ORAL_TABLET | Freq: Every day | ORAL | 1 refills | Status: DC

## 2021-04-15 MED ORDER — RISPERIDONE 1 MG PO TABS: 1.0000 mg | ORAL_TABLET | Freq: Every day | ORAL | 1 refills | Status: DC

## 2021-04-15 NOTE — Progress Notes (Signed)
Virtual Visit via Telephone Note ? ?I connected with Brooke Huber on 04/15/21 at 11:30 AM EDT by telephone and verified that I am speaking with the correct person using two identifiers.Mikhaila does not have a camera or smart phone so we opted to continue by phone.  ? ?Location: ?Patient: home ?Provider: office ?  ?I discussed the limitations, risks, security and privacy concerns of performing an evaluation and management service by telephone and the availability of in person appointments. I also discussed with the patient that there may be a patient responsible charge related to this service. The patient expressed understanding and agreed to proceed. ? ? ?History of Present Illness: ?Iana shares she is doing. She has been staying busy with various activities. Miyoshi denies depression, isolation and anhedonia. She is eating and sleeping well. Sharmain gets about 6-7 hrs/night and does not nap during the daytime. Her energy is fair. She denies passive thoughts of death and denies SI/HI. She denies anxiety and panic attacks.  ?  ?Observations/Objective: ? ?General Appearance: unable to assess  ?Eye Contact:  unable to assess  ?Speech:  Clear and Coherent and Slow  ?Volume:  Normal  ?Mood:  Euthymic  ?Affect:  Blunt  ?Thought Process: slow, concrete  Coherent and Descriptions of Associations: Intact  ?Orientation:  Full (Time, Place, and Person)  ?Thought Content:  Logical  ?Suicidal Thoughts:  No  ?Homicidal Thoughts:  No  ?Memory:  Immediate;   Good  ?Judgement:  Fair  ?Insight:  Fair  ?Psychomotor Activity: unable to assess  ?Concentration:  Concentration: Fair  ?Recall:  Fair  ?Fund of Knowledge:  Fair  ?Language:  Fair  ?Akathisia:  unable to assess  ?Handed:  unable to assess  ?AIMS (if indicated):     ?Assets:  Communication Skills ?Desire for Improvement ?Financial Resources/Insurance ?Housing ?Resilience ?Social Support ?Talents/Skills ?Transportation ?Vocational/Educational  ?ADL's:  unable to  assess  ?Cognition:  WNL  ?Sleep:     ? ? ? ?Assessment and Plan: ?Depression screen [facility] 2/9 04/15/2021 05/07/2020 04/23/2020 11/07/2018 07/31/2018  ?Decreased Interest 0 0 0 0 0  ?Down, Depressed, Hopeless 0 0 0 0 0  ?PHQ - 2 Score 0 0 0 0 0  ? ? ?Flowsheet Row Video Visit from 04/15/2021 in [facility] Video Visit from 05/07/2020 in [facility]  ?C-SSRS RISK CATEGORY No Risk No Risk  ? ?  ? ?Reviewed labs done 02/26/2021 creatinine 1.05, CBC - platelets 116 ? ?Labs: ordered  HbA1c, Lipid panel, TSH, Prolactin level and she will get her EKG from her PCP ? ? ?Encounter for long-term current use of medication - Plan: TSH, Lipid panel, Hemoglobin A1c, Prolactin ? ?Severe episode of recurrent major depressive disorder, with psychotic features ([place]) - Plan: risperiDONE (RISPERDAL) 1 MG tablet, sertraline (ZOLOFT) 100 MG tablet ? ?GAD (generalized anxiety disorder) - Plan: risperiDONE (RISPERDAL) 1 MG tablet, sertraline (ZOLOFT) 100 MG tablet ? ?Panic disorder without agoraphobia - Plan: sertraline (ZOLOFT) 100 MG tablet ? ?PTSD (post-traumatic stress disorder) - Plan: risperiDONE (RISPERDAL) 1 MG tablet ? ? ?Follow Up Instructions: ?In 5-6 months or sooner if needed ?  ?I discussed the assessment and treatment plan with the patient. The patient was provided an opportunity to ask questions and all were answered. The patient agreed with the plan and demonstrated an understanding of the instructions. ?  ?The patient was advised to call back or seek an in-person evaluation if the symptoms worsen or if the condition fails to  improve as anticipated. ? ?I provided 12 minutes of non-face-to-face time during this encounter. ? ? ?Charlcie Cradle, MD ? ?

## 2021-04-19 ENCOUNTER — Other Ambulatory Visit (HOSPITAL_COMMUNITY): Payer: Self-pay

## 2021-04-19 ENCOUNTER — Telehealth: Payer: Self-pay

## 2021-04-19 NOTE — Telephone Encounter (Signed)
[facility] Patient Advocate Encounter ? ?Received notification from Patient [place] that patient has been denied enrollment into their program to receive Vemlidy from the drug manufacturer.     ? ?Patient knows to call the office with questions or concerns. ? ?There are no funds available for Hep-B medications.  ? ?Viread $134.00 ?Baraclude $110.75 ?  ?[facility] will continue to follow. ? ?[name], CPhT ?Specialty Pharmacy Patient Advocate ?[place] for Infectious Disease ?Phone: [phone] ?Fax:  [phone]  ? ?

## 2021-04-20 ENCOUNTER — Encounter: Payer: Self-pay | Admitting: Internal Medicine

## 2021-04-20 ENCOUNTER — Ambulatory Visit: Payer: Medicare HMO | Admitting: Internal Medicine

## 2021-04-20 ENCOUNTER — Other Ambulatory Visit: Payer: Self-pay

## 2021-04-20 VITALS — BP 128/85 | HR 72 | Temp 97.3°F | Wt 166.0 lb

## 2021-04-20 DIAGNOSIS — B181 Chronic viral hepatitis B without delta-agent: Secondary | ICD-10-CM

## 2021-04-20 DIAGNOSIS — Z9189 Other specified personal risk factors, not elsewhere classified: Secondary | ICD-10-CM | POA: Diagnosis not present

## 2021-04-20 DIAGNOSIS — Z5181 Encounter for therapeutic drug level monitoring: Secondary | ICD-10-CM | POA: Diagnosis not present

## 2021-04-20 DIAGNOSIS — R69 Illness, unspecified: Secondary | ICD-10-CM | POA: Diagnosis not present

## 2021-04-20 DIAGNOSIS — Z79899 Other long term (current) drug therapy: Secondary | ICD-10-CM | POA: Diagnosis not present

## 2021-04-20 MED ORDER — VEMLIDY 25 MG PO TABS: 1.0000 | ORAL_TABLET | Freq: Every day | ORAL | 6 refills | Status: DC

## 2021-04-20 NOTE — Assessment & Plan Note (Addendum)
She has had a recent CMP in January this year.  No new concerns.  Her creat is stable.  ? ?

## 2021-04-20 NOTE — Assessment & Plan Note (Addendum)
Will check her DNA and Ag and follow up in 1 year. ?We are working on copay assistance for her since she will not be able to afford the medication otherwise.   ?

## 2021-04-20 NOTE — Progress Notes (Signed)
? ?  Subjective:  ? ? Patient ID: Brooke Huber, female    DOB: [birth year], 76 y.o.   MRN: [id number] ? ?HPI ?Here for follow up of chronic hepatitis B ?She has a history of lymphoma and had been started on suppressive tenofovir at that time for treatment then developed a flare of hepatitis B after stopping medication.  She was put back on it with a  Plan to remain on it lifelong.  No issues with taking the medication.  Copay is high and working on assistance.  She continues to get ultrasounds for [facility] screening twice a year.  Hepatitis B DNA has remained suppressed.   ? ? ?Review of Systems  ?Constitutional:  Negative for fatigue.  ?Gastrointestinal:  Negative for diarrhea.  ?Skin:  Negative for rash.  ? ?   ?Objective:  ? Physical Exam ?Eyes:  ?   General: No scleral icterus. ?Pulmonary:  ?   Effort: Pulmonary effort is normal.  ?Neurological:  ?   Mental Status: She is alert.  ?Psychiatric:     ?   Mood and Affect: Mood normal.  ? ?SH: no alcohol, no tobacco ? ? ? ? ?   ?Assessment & Plan:  ? ?

## 2021-04-20 NOTE — Assessment & Plan Note (Signed)
Will continue with [place] screening every 6 months, lifelong ?

## 2021-04-21 ENCOUNTER — Other Ambulatory Visit (HOSPITAL_COMMUNITY): Payer: Self-pay

## 2021-04-21 ENCOUNTER — Telehealth: Payer: Self-pay

## 2021-04-21 NOTE — Telephone Encounter (Signed)
[facility] Patient Advocate Encounter ? ?Completed and sent [employer] application for Vemlidy for this patient who is insured.   ? ?Patient is approved 04/20/21 through 01/30/22. ? ?BIN      [id number] ?PCN    [id number] ?GRP    [id number] ?ID        [id number] ? ?Faxed information to [employer] [phone] ? ?[name], CPhT ?Specialty Pharmacy Patient Advocate ?[place] for Infectious Disease ?Phone: [phone] ?Fax:  [phone]  ?

## 2021-04-22 ENCOUNTER — Ambulatory Visit: Payer: Medicare HMO | Admitting: Internal Medicine

## 2021-04-23 ENCOUNTER — Ambulatory Visit
Admission: RE | Admit: 2021-04-23 | Discharge: 2021-04-23 | Disposition: A | Discharge status: Home / Self Care | Payer: Medicare HMO | Source: Ambulatory Visit | Attending: Internal Medicine | Admitting: Internal Medicine

## 2021-04-23 ENCOUNTER — Other Ambulatory Visit: Payer: Self-pay | Admitting: Internal Medicine

## 2021-04-23 ENCOUNTER — Other Ambulatory Visit (HOSPITAL_COMMUNITY): Payer: Self-pay

## 2021-04-23 DIAGNOSIS — B181 Chronic viral hepatitis B without delta-agent: Secondary | ICD-10-CM

## 2021-04-23 DIAGNOSIS — K7460 Unspecified cirrhosis of liver: Secondary | ICD-10-CM | POA: Diagnosis not present

## 2021-04-23 DIAGNOSIS — Z9189 Other specified personal risk factors, not elsewhere classified: Secondary | ICD-10-CM

## 2021-04-23 LAB — HEPATITIS B DNA, ULTRAQUANTITATIVE, PCR
Hepatitis B DNA (Calc): <1 Log IU/mL
Hepatitis B DNA: <10 IU/mL

## 2021-04-23 LAB — HEPATITIS B SURFACE ANTIGEN: Hepatitis B Surface Ag: REACTIVE — AB

## 2021-04-26 ENCOUNTER — Other Ambulatory Visit: Payer: Self-pay | Admitting: Internal Medicine

## 2021-04-26 DIAGNOSIS — B181 Chronic viral hepatitis B without delta-agent: Secondary | ICD-10-CM

## 2021-05-05 ENCOUNTER — Telehealth (HOSPITAL_COMMUNITY): Payer: Self-pay | Admitting: *Deleted

## 2021-05-05 NOTE — Telephone Encounter (Signed)
Labs results drawn on 04/20/21 received fron [place].testing consists of Prolactin level High @ 77.00, HgbA1C @ 5.6%, TSH @ 1.45 uiu/ml, lipid Panel WNL except NHDL @ '147mg'$ /dl, and LDL high @ 120.EKG also attached to lab results. EKG also done on 04/20/21. EKG  WNL with some sinus bradycardia with HR @ 56 BPM. QTC 428/421 msec. Will be sent to scan center to be scanned into chart. ?

## 2021-05-06 ENCOUNTER — Telehealth (HOSPITAL_COMMUNITY): Payer: Self-pay | Admitting: *Deleted

## 2021-05-06 ENCOUNTER — Telehealth (HOSPITAL_COMMUNITY): Payer: Self-pay | Admitting: Psychiatry

## 2021-05-06 DIAGNOSIS — F333 Major depressive disorder, recurrent, severe with psychotic symptoms: Secondary | ICD-10-CM

## 2021-05-06 MED ORDER — ARIPIPRAZOLE 2 MG PO TABS: 2.0000 mg | ORAL_TABLET | Freq: Every day | ORAL | 0 refills | Status: DC

## 2021-05-06 NOTE — Telephone Encounter (Signed)
Spoke with Brooke Huber regarding her elevated Prolactin level. She is denying changes in breast size, tenderness, pain or discharge from breasts. We agreed to stop Risperdal. We will start Abilify '2mg'$  po qD. ? ?Follow up in 2-4 weeks or sooner if needed ? ?Total time speaking with patient: 21mn ?

## 2021-05-06 NOTE — Telephone Encounter (Signed)
Writer spoke with who called asking to speak to you. This nurse asked for more information regarding request and pt stated that the "new medicine", Abilify 2 mg, will cost her $100 per # 30 script. Pt says she will not pay that and just will not take anything if there's nothing less expensive. Pt next appointment scheduled for 10/07/21. Please review and advise.  ?

## 2021-05-06 NOTE — Telephone Encounter (Signed)
We also have varius savings cards. However pt usually tell me that's the price with the coupon. I will call her pharmacy and see. ?

## 2021-05-06 NOTE — Telephone Encounter (Signed)
Not specifically for Abilify. Pharmacy can use GoodRx.

## 2021-05-07 ENCOUNTER — Telehealth (HOSPITAL_COMMUNITY): Payer: Self-pay | Admitting: *Deleted

## 2021-05-07 NOTE — Telephone Encounter (Signed)
Spoke with pharmacist Suarez @ pt pharmacy [facility] [address]. Pharmacist confirmed that pt picked up the Abilify 2 mg yesterday and co pay was $10.97. FYI. ?

## 2021-05-10 ENCOUNTER — Telehealth (HOSPITAL_COMMUNITY): Payer: Self-pay | Admitting: *Deleted

## 2021-05-10 NOTE — Telephone Encounter (Signed)
Pt has called several times stating that she will not take the Abilify because it costs too much ($10.97 per pharmacist) stating it cost her over $100.secondly pt says she will not take two antidepressants and instead needs a sleep aid. Writer again attempted some med ed however pt did not agree and is perseverating on it all. Pt does have an upcoming appointment with you on 05/27/21. Please review and advise.  ?

## 2021-05-12 ENCOUNTER — Telehealth (HOSPITAL_COMMUNITY): Payer: Self-pay | Admitting: *Deleted

## 2021-05-12 NOTE — Telephone Encounter (Signed)
Pt has called again stating again that she won't take the Abilify 2 mg due to expense. Nurse has reminded pt several times that her pharmacy verified pt picked up said medication with a $10.97 co-pay. Pt continues to ask if you will change to something less expensive. Pt appointment scheduled for 05/27/21. ?

## 2021-05-13 ENCOUNTER — Telehealth (HOSPITAL_COMMUNITY): Payer: Self-pay

## 2021-05-13 NOTE — Telephone Encounter (Signed)
Pt called again regarding her medication referred to in previous documentation ?

## 2021-05-18 NOTE — Telephone Encounter (Signed)
I attempted to call patient. There was no answer and I was unable to leave a voice message.

## 2021-05-27 ENCOUNTER — Telehealth (HOSPITAL_BASED_OUTPATIENT_CLINIC_OR_DEPARTMENT_OTHER): Payer: Medicare HMO | Admitting: Psychiatry

## 2021-05-27 DIAGNOSIS — R69 Illness, unspecified: Secondary | ICD-10-CM | POA: Diagnosis not present

## 2021-05-27 DIAGNOSIS — F4310 Post-traumatic stress disorder, unspecified: Secondary | ICD-10-CM

## 2021-05-27 DIAGNOSIS — F410 Panic disorder [episodic paroxysmal anxiety] without agoraphobia: Secondary | ICD-10-CM

## 2021-05-27 DIAGNOSIS — F411 Generalized anxiety disorder: Secondary | ICD-10-CM | POA: Diagnosis not present

## 2021-05-27 DIAGNOSIS — F333 Major depressive disorder, recurrent, severe with psychotic symptoms: Secondary | ICD-10-CM

## 2021-05-27 MED ORDER — ARIPIPRAZOLE 2 MG PO TABS: 2.0000 mg | ORAL_TABLET | Freq: Every day | ORAL | 0 refills | Status: DC

## 2021-05-27 NOTE — Progress Notes (Signed)
Virtual Visit via Telephone Note ? ?I connected with Brooke Huber on 05/27/21 at  9:45 AM EDT by telephone and verified that I am speaking with the correct person using two identifiers. ? ?Location: ?Patient: home ?Provider: office ?  ?I discussed the limitations, risks, security and privacy concerns of performing an evaluation and management service by telephone and the availability of in person appointments. I also discussed with the patient that there may be a patient responsible charge related to this service. The patient expressed understanding and agreed to proceed. ? ? ?History of Present Illness: ?Brooke Huber is very concerned that the Abilify cost was going to be $100 instead of the $10/month with the coupon. She did pick it up and has been taking it for the last 2 weeks. She denies any SE. Anxiety did spike due to worry about the cost but improved this week. Brooke Huber denies any panic attacks. She denies nightmares, HV and intrusive memories. She denies feeling depression in the last 2 weeks. She is eating well and denies any changes. Her sleep and energy are fair. She denies SI/HI.  ?  ?Observations/Objective: ? ?General Appearance: unable to assess  ?Eye Contact:  unable to assess  ?Speech:  Clear and Coherent and Slow  ?Volume:  Decreased  ?Mood:  Anxious  ?Affect:  Blunt  ?Thought Process:  Goal Directed, Linear, and Descriptions of Associations: Intact  ?Orientation:  Full (Time, Place, and Person)  ?Thought Content:  Logical  ?Suicidal Thoughts:  No  ?Homicidal Thoughts:  No  ?Memory:  Immediate;   Good  ?Judgement:  Fair  ?Insight:  Good  ?Psychomotor Activity: unable to assess  ?Concentration:  Concentration: Good  ?Recall:  Good  ?Fund of Knowledge:  Good  ?Language:  Good  ?Akathisia:  unable to assess  ?Handed:  unable to assess  ?AIMS (if indicated):     ?Assets:  Communication Skills ?Desire for Improvement ?Financial  Resources/Insurance ?Housing ?Resilience ?Talents/Skills ?Transportation ?Vocational/Educational  ?ADL's:  unable to assess  ?Cognition:  WNL  ?Sleep:     ? ? ? ?Assessment and Plan: ? ?  05/27/2021  ?  9:53 AM 04/20/2021  ? 11:00 AM 04/15/2021  ? 11:33 AM 05/07/2020  ? 11:33 AM 04/23/2020  ?  9:36 AM  ?Depression screen PHQ 2/9  ?Decreased Interest 0 0 0 0 0  ?Down, Depressed, Hopeless 0 0 0 0 0  ?PHQ - 2 Score 0 0 0 0 0  ? ? ?Flowsheet Row Video Visit from 05/27/2021 in [facility] Video Visit from 04/15/2021 in [facility] Video Visit from 05/07/2020 in [facility]  ?C-SSRS RISK CATEGORY No Risk No Risk No Risk  ? ?  ? ?1. Severe episode of recurrent major depressive disorder, with psychotic features ([place]) ?- ARIPiprazole (ABILIFY) 2 MG tablet; Take 1 tablet (2 mg total) by mouth daily.  Dispense: 90 tablet; Refill: 0 ? ?2. GAD (generalized anxiety disorder) ? ?3. Panic disorder without agoraphobia ? ?4. PTSD (post-traumatic stress disorder) ? ? ?Continue Zoloft '100mg'$  po qD  ? ?Follow Up Instructions: ?In 2-3 months or sooner needed ?  ?I discussed the assessment and treatment plan with the patient. The patient was provided an opportunity to ask questions and all were answered. The patient agreed with the plan and demonstrated an understanding of the instructions. ?  ?The patient was advised to call back or seek an in-person evaluation if the symptoms worsen or if the condition fails  to improve as anticipated. ? ?I provided 8 minutes of non-face-to-face time during this encounter. ? ? ?Charlcie Cradle, MD ? ?

## 2021-06-01 ENCOUNTER — Other Ambulatory Visit (HOSPITAL_COMMUNITY): Payer: Self-pay | Admitting: Psychiatry

## 2021-06-01 DIAGNOSIS — F333 Major depressive disorder, recurrent, severe with psychotic symptoms: Secondary | ICD-10-CM

## 2021-07-12 DIAGNOSIS — Z1231 Encounter for screening mammogram for malignant neoplasm of breast: Secondary | ICD-10-CM | POA: Diagnosis not present

## 2021-07-28 DIAGNOSIS — C8218 Follicular lymphoma grade II, lymph nodes of multiple sites: Secondary | ICD-10-CM | POA: Diagnosis not present

## 2021-07-28 DIAGNOSIS — Z23 Encounter for immunization: Secondary | ICD-10-CM | POA: Diagnosis not present

## 2021-07-28 DIAGNOSIS — R69 Illness, unspecified: Secondary | ICD-10-CM | POA: Diagnosis not present

## 2021-07-28 DIAGNOSIS — M8580 Other specified disorders of bone density and structure, unspecified site: Secondary | ICD-10-CM | POA: Diagnosis not present

## 2021-07-28 DIAGNOSIS — K5900 Constipation, unspecified: Secondary | ICD-10-CM | POA: Diagnosis not present

## 2021-07-28 DIAGNOSIS — K219 Gastro-esophageal reflux disease without esophagitis: Secondary | ICD-10-CM | POA: Diagnosis not present

## 2021-07-28 DIAGNOSIS — Z0000 Encounter for general adult medical examination without abnormal findings: Secondary | ICD-10-CM | POA: Diagnosis not present

## 2021-07-28 DIAGNOSIS — E221 Hyperprolactinemia: Secondary | ICD-10-CM | POA: Diagnosis not present

## 2021-08-11 ENCOUNTER — Telehealth: Payer: Self-pay | Admitting: Hematology

## 2021-08-11 NOTE — Telephone Encounter (Signed)
Rescheduled upcoming appointment due to provider's template. Patient is aware of changes. 

## 2021-08-12 ENCOUNTER — Telehealth (HOSPITAL_BASED_OUTPATIENT_CLINIC_OR_DEPARTMENT_OTHER): Payer: Medicare HMO | Admitting: Psychiatry

## 2021-08-12 DIAGNOSIS — F410 Panic disorder [episodic paroxysmal anxiety] without agoraphobia: Secondary | ICD-10-CM | POA: Diagnosis not present

## 2021-08-12 DIAGNOSIS — F333 Major depressive disorder, recurrent, severe with psychotic symptoms: Secondary | ICD-10-CM | POA: Diagnosis not present

## 2021-08-12 DIAGNOSIS — F411 Generalized anxiety disorder: Secondary | ICD-10-CM | POA: Diagnosis not present

## 2021-08-12 DIAGNOSIS — R69 Illness, unspecified: Secondary | ICD-10-CM | POA: Diagnosis not present

## 2021-08-12 MED ORDER — ARIPIPRAZOLE 2 MG PO TABS: 2.0000 mg | ORAL_TABLET | Freq: Every day | ORAL | 0 refills | Status: DC

## 2021-08-12 MED ORDER — SERTRALINE HCL 100 MG PO TABS: 100.0000 mg | ORAL_TABLET | Freq: Every day | ORAL | 1 refills | Status: DC

## 2021-08-12 NOTE — Progress Notes (Signed)
Virtual Visit via Phone Note  I connected with Brooke Huber on 08/12/21 at 11:30 AM EDT by phone. Adasha does not have a smart phone, laptop or Internet at her home.  I verified that I am speaking with the correct person using two identifiers.  Location: Patient: home Provider: office   I discussed the limitations of evaluation and management by telemedicine and the availability of in person appointments. The patient expressed understanding and agreed to proceed.  History of Present Illness: Brooke Huber has been doing well. She has been spending time with her family or friends at least once/week. She really enjoys doing that. At home she works on her hobbies, cleans or watches tv. Caleb is sleeping and eating well. She denies depression and anhedonia. She denies hopelessness. She denies SI/HI. She can't recall the last time she felt anxious or had a panic attacks. Her PTSD is well controlled and she can't recall the last time she had symptoms. Brooke Huber    Observations/Objective: Psychiatric Specialty Exam:  General Appearance: unable to assess  Eye Contact:  unable to assess  Speech:  Clear and Coherent and Normal Rate  Volume:  Normal  Mood:  Euthymic  Affect:  Constricted  Thought Process:  Coherent and Descriptions of Associations: Intact  Orientation:  Full (Time, Place, and Person)  Thought Content:  Logical  Suicidal Thoughts:  No  Homicidal Thoughts:  No  Memory:  Immediate;   Good  Judgement:  Good  Insight:  Good  Psychomotor Activity: unable to assess  Concentration:  Concentration: Good  Recall:  Good  Fund of Knowledge:  Good  Language:  Good  Akathisia:  unable to assess  Handed:  unable to assess  AIMS (if indicated):     Assets:  Communication Skills Desire for Improvement Financial Resources/Insurance Housing Leisure Time Resilience Social Support Talents/Skills Transportation Vocational/Educational  ADL's:  unable to assess  Cognition:  WNL   Sleep:         Assessment and Plan:     08/12/2021   11:50 AM 05/27/2021    9:53 AM 04/20/2021   11:00 AM 04/15/2021   11:33 AM 05/07/2020   11:33 AM  Depression screen PHQ 2/9  Decreased Interest 0 0 0 0 0  Down, Depressed, Hopeless 0 0 0 0 0  PHQ - 2 Score 0 0 0 0 0    Flowsheet Row Video Visit from 08/12/2021 in [facility] Video Visit from 05/27/2021 in [facility] Video Visit from 04/15/2021 in [facility] No Risk No Risk No Risk         Status of current problems: stable  Meds:  1. Severe episode of recurrent major depressive disorder, with psychotic features ([place]) - ARIPiprazole (ABILIFY) 2 MG tablet; Take 1 tablet (2 mg total) by mouth daily.  Dispense: 90 tablet; Refill: 0 - sertraline (ZOLOFT) 100 MG tablet; Take 1 tablet (100 mg total) by mouth daily.  Dispense: 90 tablet; Refill: 1  2. GAD (generalized anxiety disorder) - sertraline (ZOLOFT) 100 MG tablet; Take 1 tablet (100 mg total) by mouth daily.  Dispense: 90 tablet; Refill: 1  3. Panic disorder without agoraphobia - sertraline (ZOLOFT) 100 MG tablet; Take 1 tablet (100 mg total) by mouth daily.  Dispense: 90 tablet; Refill: 1     Labs: reviewed EKG done 04/20/21 QTc 421    Therapy: brief supportive therapy provided.   Collaboration of Care: Other none  Patient/Guardian was advised Release of Information must be obtained prior to any record release in order to collaborate their care with an outside provider. Patient/Guardian was advised if they have not already done so to contact the registration department to sign all necessary forms in order for [name] to release information regarding their care.   Consent: Patient/Guardian gives verbal consent for treatment and assignment of benefits for services provided during this visit. Patient/Guardian expressed understanding  and agreed to proceed.      Follow Up Instructions: Follow up in 5-6 months or sooner if needed- in person    I discussed the assessment and treatment plan with the patient. The patient was provided an opportunity to ask questions and all were answered. The patient agreed with the plan and demonstrated an understanding of the instructions.   The patient was advised to call back or seek an in-person evaluation if the symptoms worsen or if the condition fails to improve as anticipated.  I provided 9 minutes of non-face-to-face time during this encounter.   Charlcie Cradle, MD

## 2021-08-26 ENCOUNTER — Other Ambulatory Visit: Payer: Medicare HMO

## 2021-08-26 ENCOUNTER — Ambulatory Visit: Payer: Medicare HMO | Admitting: Hematology

## 2021-08-28 ENCOUNTER — Other Ambulatory Visit (HOSPITAL_COMMUNITY): Payer: Self-pay | Admitting: Psychiatry

## 2021-08-28 DIAGNOSIS — F333 Major depressive disorder, recurrent, severe with psychotic symptoms: Secondary | ICD-10-CM

## 2021-09-17 ENCOUNTER — Other Ambulatory Visit: Payer: Self-pay | Admitting: *Deleted

## 2021-09-17 DIAGNOSIS — C8218 Follicular lymphoma grade II, lymph nodes of multiple sites: Secondary | ICD-10-CM

## 2021-09-20 ENCOUNTER — Inpatient Hospital Stay: Payer: Medicare HMO | Admitting: Hematology

## 2021-09-20 ENCOUNTER — Other Ambulatory Visit: Payer: Self-pay

## 2021-09-20 ENCOUNTER — Inpatient Hospital Stay: Payer: Medicare HMO | Attending: Hematology

## 2021-09-20 VITALS — BP 160/69 | HR 60 | Temp 98.1°F | Resp 15 | Wt 168.7 lb

## 2021-09-20 DIAGNOSIS — Z8572 Personal history of non-Hodgkin lymphomas: Secondary | ICD-10-CM | POA: Diagnosis not present

## 2021-09-20 DIAGNOSIS — B1910 Unspecified viral hepatitis B without hepatic coma: Secondary | ICD-10-CM | POA: Diagnosis not present

## 2021-09-20 DIAGNOSIS — C8218 Follicular lymphoma grade II, lymph nodes of multiple sites: Secondary | ICD-10-CM

## 2021-09-20 DIAGNOSIS — D696 Thrombocytopenia, unspecified: Secondary | ICD-10-CM | POA: Diagnosis not present

## 2021-09-20 DIAGNOSIS — Z79624 Long term (current) use of inhibitors of nucleotide synthesis: Secondary | ICD-10-CM | POA: Insufficient documentation

## 2021-09-20 DIAGNOSIS — R69 Illness, unspecified: Secondary | ICD-10-CM | POA: Diagnosis not present

## 2021-09-20 DIAGNOSIS — Z79899 Other long term (current) drug therapy: Secondary | ICD-10-CM | POA: Insufficient documentation

## 2021-09-20 LAB — CBC WITH DIFFERENTIAL (CANCER CENTER ONLY)
Abs Immature Granulocytes: 0.01 10*3/uL (ref 0.00–0.07)
Basophils Absolute: 0 10*3/uL (ref 0.0–0.1)
Basophils Relative: 1 %
Eosinophils Absolute: 0.1 10*3/uL (ref 0.0–0.5)
Eosinophils Relative: 3 %
HCT: 39.5 % (ref 36.0–46.0)
Hemoglobin: 13.4 g/dL (ref 12.0–15.0)
Immature Granulocytes: 0 %
Lymphocytes Relative: 15 %
Lymphs Abs: 0.6 10*3/uL — ABNORMAL LOW (ref 0.7–4.0)
MCH: 31.1 pg (ref 26.0–34.0)
MCHC: 33.9 g/dL (ref 30.0–36.0)
MCV: 91.6 fL (ref 80.0–100.0)
Monocytes Absolute: 0.4 10*3/uL (ref 0.1–1.0)
Monocytes Relative: 10 %
Neutro Abs: 2.9 10*3/uL (ref 1.7–7.7)
Neutrophils Relative %: 71 %
Platelet Count: 108 10*3/uL — ABNORMAL LOW (ref 150–400)
RBC: 4.31 MIL/uL (ref 3.87–5.11)
RDW: 12.4 % (ref 11.5–15.5)
WBC Count: 4 10*3/uL (ref 4.0–10.5)
nRBC: 0 % (ref 0.0–0.2)

## 2021-09-20 LAB — CMP (CANCER CENTER ONLY)
ALT: 10 U/L (ref 0–44)
AST: 17 U/L (ref 15–41)
Albumin: 4.2 g/dL (ref 3.5–5.0)
Alkaline Phosphatase: 63 U/L (ref 38–126)
Anion gap: 3 — ABNORMAL LOW (ref 5–15)
BUN: 15 mg/dL (ref 8–23)
CO2: 32 mmol/L (ref 22–32)
Calcium: 9.5 mg/dL (ref 8.9–10.3)
Chloride: 107 mmol/L (ref 98–111)
Creatinine: 1.12 mg/dL — ABNORMAL HIGH (ref 0.44–1.00)
GFR, Estimated: 51 mL/min — ABNORMAL LOW (ref >60)
Glucose, Bld: 94 mg/dL (ref 70–99)
Potassium: 4 mmol/L (ref 3.5–5.1)
Sodium: 142 mmol/L (ref 135–145)
Total Bilirubin: 0.5 mg/dL (ref 0.3–1.2)
Total Protein: 6.4 g/dL — ABNORMAL LOW (ref 6.5–8.1)

## 2021-09-20 LAB — LACTATE DEHYDROGENASE: LDH: 123 U/L (ref 98–192)

## 2021-09-22 ENCOUNTER — Telehealth: Payer: Self-pay

## 2021-09-22 DIAGNOSIS — B181 Chronic viral hepatitis B without delta-agent: Secondary | ICD-10-CM

## 2021-09-22 MED ORDER — VEMLIDY 25 MG PO TABS: 1.0000 | ORAL_TABLET | Freq: Every day | ORAL | 6 refills | Status: DC

## 2021-09-22 NOTE — Telephone Encounter (Signed)
She should be able to fill where ever she wants she have medicare D

## 2021-09-22 NOTE — Addendum Note (Signed)
Addended by: Lucie Leather D on: 09/22/2021 12:07 PM   Modules accepted: Orders

## 2021-09-22 NOTE — Telephone Encounter (Signed)
Patient called requesting refill of Vemlidy, she would like to pick it up at the [facility] on [facility] [address] [address].   Beryle Flock, RN

## 2021-09-26 NOTE — Progress Notes (Signed)
HEMATOLOGY/ONCOLOGY CLINIC NOTE  Date of Service:  .09/20/2021   Patient Care Team: Alroy Dust, L.Marlou Sa, MD as PCP - General (Family Medicine)  CHIEF COMPLAINTS/PURPOSE OF CONSULTATION:  Follow-up for continued evaluation and management of low-grade follicular lymphoma  HISTORY OF PRESENTING ILLNESS:    Interval History:  [name] returns for her 77-monthfollow-up for continued evaluation and management of low-grade follicular lymphoma. She notes no acute new symptoms since her last clinic visit. No fevers no chills no night sweats no unexpected weight loss. No new lumps or bumps. Continues to be on suppressive treatment for hepatitis B. Labs done today were reviewed in detail with her.   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   Diagnosed in hands    Cancer ([facility]    breast lymphoma with mets   Depression    GERD (gastroesophageal reflux disease)    Hepatitis    Hepatitis B 02/1995   Seizures (HCC)    hx of seizure in 1993 and 1994 - caused by stress per patient   -Diverticulitis  -Osteopenia  -Acute gastritis   SURGICAL HISTORY: Past Surgical History:  Procedure Laterality Date   APPENDECTOMY     COLONOSCOPY WITH PROPOFOL N/A 12/29/2015   Procedure: COLONOSCOPY WITH PROPOFOL;  Surgeon: MGarlan Fair MD;  Location: [facility];  Service: Endoscopy;  Laterality: N/A;   TONSILLECTOMY AND ADENOIDECTOMY Bilateral 1954   TUBAL LIGATION      SOCIAL HISTORY: Social History   Socioeconomic History   Marital status: Single    Spouse name: Not on file   Number of children: Not on file   Years of education: Not on file   Highest education level: Not on file  Occupational History   Not on file  Tobacco Use   Smoking status: Never   Smokeless tobacco: Never  Vaping Use   Vaping Use: Never used  Substance and Sexual Activity   Alcohol use: No   Drug use: No   Sexual activity: Never    Birth  control/protection: None  Other Topics Concern   Not on file  Social History Narrative   Not on file   Social Determinants of Health   Financial Resource Strain: Not on file  Food Insecurity: Not on file  Transportation Needs: Not on file  Physical Activity: Not on file  Stress: Not on file  Social Connections: Not on file  Intimate Partner Violence: Not on file    FAMILY HISTORY: Family History  Problem Relation Age of Onset   Alzheimer's disease Sister    Dementia Sister    Sexual abuse Sister    Seizures Sister    Alcohol abuse Brother    Alcohol abuse Maternal Uncle    Drug abuse Cousin    Drug abuse Son    Depression Neg Hx     ALLERGIES:  is allergic to penicillins.  MEDICATIONS:  Current Outpatient Medications  Medication Sig Dispense Refill   ARIPiprazole (ABILIFY) 2 MG tablet Take 1 tablet (2 mg total) by mouth daily. 90 tablet 0   Calcium Carb-Cholecalciferol 600-100 MG-UNIT CAPS Take 1 tablet by mouth daily.     Cholecalciferol (VITAMIN D-400 PO) Take by mouth. 1x/ day     famotidine (PEPCID) 20 MG tablet Take 20 mg by mouth 2 (two) times daily.     Multiple Vitamin (MULTIVITAMIN WITH MINERALS) TABS tablet Take 1 tablet by mouth daily. 30 tablet 0   polyethylene glycol (MIRALAX /  GLYCOLAX) 17 g packet Take 17 g by mouth daily. 14 each 0   senna-docusate (SENOKOT-S) 8.6-50 MG tablet Take 1 tablet by mouth at bedtime as needed for mild constipation. 30 tablet 0   sertraline (ZOLOFT) 100 MG tablet Take 1 tablet (100 mg total) by mouth daily. 90 tablet 1   Tenofovir Alafenamide Fumarate (VEMLIDY) 25 MG TABS Take 1 tablet (25 mg total) by mouth daily. 30 tablet 6   No current facility-administered medications for this visit.   Facility-Administered Medications Ordered in Other Visits  Medication Dose Route Frequency Provider Last Rate Last Admin   pneumococcal 13-valent conjugate vaccine (PREVNAR 13) injection 0.5 mL  0.5 mL Intramuscular Once Brunetta Genera, MD        REVIEW OF SYSTEMS:   10 Point review of Systems was done is negative except as noted above.  PHYSICAL EXAMINATION: ECOG PERFORMANCE STATUS: 1 - Symptomatic but completely ambulatory  Vitals:   09/20/21 0915  BP: (!) 160/69  Pulse: 60  Resp: 15  Temp: 98.1 F (36.7 C)  SpO2: 98%    Filed Weights   09/20/21 0915  Weight: 168 lb 11.2 oz (76.5 kg)    .Body mass index is 30.86 kg/m.   NAD GENERAL:alert, in no acute distress and comfortable SKIN: no acute rashes, no significant lesions EYES: conjunctiva are pink and non-injected, sclera anicteric OROPHARYNX: MMM, no exudates, no oropharyngeal erythema or ulceration NECK: supple, no JVD LYMPH:  no palpable lymphadenopathy in the cervical, axillary or inguinal regions LUNGS: clear to auscultation b/l with normal respiratory effort HEART: regular rate & rhythm ABDOMEN:  normoactive bowel sounds , non tender, not distended. Extremity: no pedal edema PSYCH: alert & oriented x 3 with fluent speech NEURO: no focal motor/sensory deficits   LABORATORY DATA:  I have reviewed the data as listed  .    Latest Ref Rng & Units 09/20/2021    8:59 AM 02/26/2021    9:35 AM 08/27/2020    9:20 AM  CBC  WBC 4.0 - 10.5 K/uL 4.0  5.7  4.4   Hemoglobin 12.0 - 15.0 g/dL 13.4  13.6  13.4   Hematocrit 36.0 - 46.0 % 39.5  40.6  39.8   Platelets 150 - 400 K/uL 108  116  111    ANC 200 .    Latest Ref Rng & Units 09/20/2021    8:59 AM 02/26/2021    9:35 AM 08/27/2020    9:20 AM  CMP  Glucose 70 - 99 mg/dL 94  98  110   BUN 8 - 23 mg/dL 15  19  16    Creatinine 0.44 - 1.00 mg/dL 1.12  1.05  1.21   Sodium 135 - 145 mmol/L 142  142  142   Potassium 3.5 - 5.1 mmol/L 4.0  4.0  3.7   Chloride 98 - 111 mmol/L 107  107  107   CO2 22 - 32 mmol/L 32  29  27   Calcium 8.9 - 10.3 mg/dL 9.5  9.6  9.5   Total Protein 6.5 - 8.1 g/dL 6.4  6.7  6.8   Total Bilirubin 0.3 - 1.2 mg/dL 0.5  0.6  0.6   Alkaline Phos 38 - 126 U/L 63  60   64   AST 15 - 41 U/L 17  16  19    ALT 0 - 44 U/L 10  9  10     . Lab Results  Component Value Date   LDH 123 09/20/2021  Component     Latest Ref Rng & Units 05/02/2017 06/29/2017  HBV DNA SERPL PCR-ACNC     IU/mL 290 HBV DNA not detected  HBV DNA SERPL PCR-LOG IU     log10 IU/mL 2.462 UNABLE TO CALCULATE  Test Info:      Comment Comment   Component     Latest Ref Rng & Units 07/12/2017  HBV DNA SERPL PCR-ACNC     IU/mL HBV DNA not detected  HBV DNA SERPL PCR-LOG IU     log10 IU/mL UNABLE TO CALCULATE  Test Info:      Comment   Component     Latest Ref Rng & Units 05/02/2017  HBV DNA SERPL PCR-ACNC     IU/mL 290  HBV DNA SERPL PCR-LOG IU     log10 IU/mL 2.462  Test Info:      Comment  Hep B Core Ab, IgM     Negative Negative  Hep B Core Ab, Tot     Negative Positive (A)  Hepatitis B Surface Ag     Negative Confirm. indicated  HCV Ab     0.0 - 0.9 s/co ratio <0.1  HBsAg Conf      Positive (A)    PATHOLOGY    Diagnosis 04/25/17  Lymph node, needle/core biopsy, left - FOLLICULAR LYMPHOMA, GRADE 1-2. - SEE ONCOLOGY TABLE. Microscopic Comment LYMPHOMA Histologic type: Non-Hodgkin B-cell lymphoma: Follicular lymphoma. Grade (if applicable): Low grade (grade 1-2). Flow cytometry: N/A. Immunohistochemical stains: CD20, CD3, CD5, CD10, CD23, CD43, bcl-2, bcl-6, CD138, light chains, Ki-67. Touch preps/imprints: N/A. Comments: Core biopsies reveal effacement by a vaguely nodular infiltrate of atypical lymphocytes. The lymphocytes are predominately small with irregular nuclear contours. There are no diffuse areas of large cells. Immunohistochemistry reveals abundant CD20-positive B-cells. CD3, CD43, and CD5 highlight interfollicular T-cells. Bcl-6 and CD23 reveal numerous small follicles with bcl-6 positive cells extending outside the follicular dendritic networks. The majority of these cells are bcl-2 positive. CD10 reveals only a few small residual germinal center  foci. CD138 highlights scattered plasma cells which are polytypic by light chain in situ hybridization. The overall findings are most consistent with a low grade follicular lymphoma. Dr. Isaiah Blakes was notified on 04/27/2017.   RADIOGRAPHIC STUDIES: I have personally reviewed the radiological images as listed and agreed with the findings in the report. No results found.   Screening Mammogram 04/17/17 IMPRESSION:  The New enlarged nodes in both axillae are indeterminate. An [name] is recommended.    ASSESSMENT & PLAN:   ANALLELY ROSELL is a 76 y.o. Caucasian female with   1. 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  10/18/17 PET/CT revealed No measurable metabolic activity within previously enlarged cervical, axillary, mediastinal, and abdominal and pelvic lymph nodes ( [place] 1). 2. Findings support complete response to therapy. 3. Spleen is normal size with normal metabolic activity. 4. No evidence of marrow involvement.   The pt reached complete metabolic response after three cycles of Bendamustine/Rituxan and 2 cycles of only Rituxan (due to neutropenia)  05/16/18 CT C/A/P revealed No residual or recurrent lymphadenopathy in the chest, abdomen, or pelvis. 2. Mild splenomegaly, maximum span 13.2 cm, significantly decreased in size compared to prior examinations.  2. Mild thrombocytopenia--much worse previously improved significantly when her lymphoma was treated.  Currently mild thrombocytopenia likely from tenofovir and hep B related liver disease   3. Chronic active Hepatitis B -Hepatitis B labs from 05/02/17 revealed chronic active disease which will be a  limiting factor in her treatment.  -Last hepatitis B DNA viral load suppressed on 04/23/2020 -Continues to be on tenofovir as per infectious disease Ultrasound abdomen 10/13/2020 no evidence of suspicious lesions for [facility]  PLAN: - patient's labs done today were reviewed in detail with her. 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 CBC stable LDH within normal limits -And has no clinical or lab evidence of follicular lymphoma progression at this time. --She is continue to follow with infectious disease for continued management of her hepatitis B -No indication for additional treatment of her follicular lymphoma at this time.  FOLLOW UP: RTC with Dr Irene Limbo with labs in 6 months  The total time spent in the appointment was 20 minutes*.  All of the patient's questions were answered with apparent satisfaction. The patient knows to call the clinic with any problems, questions or concerns.   Sullivan Lone MD MS AAHIVMS [facility] Hematology/Oncology Physician [facility]  .*Total Encounter Time as defined by the Centers for Medicare and Medicaid Services includes, in addition to the face-to-face time of a patient visit (documented in the note above) non-face-to-face time: obtaining and reviewing outside history, ordering and reviewing medications, tests or procedures, care coordination (communications with other health care professionals or caregivers) and documentation in the medical record.

## 2021-10-07 ENCOUNTER — Telehealth (HOSPITAL_COMMUNITY): Payer: Medicare HMO | Admitting: Psychiatry

## 2021-11-09 ENCOUNTER — Other Ambulatory Visit (HOSPITAL_COMMUNITY): Payer: Self-pay | Admitting: Psychiatry

## 2021-11-09 DIAGNOSIS — F333 Major depressive disorder, recurrent, severe with psychotic symptoms: Secondary | ICD-10-CM

## 2021-11-12 ENCOUNTER — Other Ambulatory Visit: Payer: Self-pay | Admitting: Internal Medicine

## 2021-11-12 DIAGNOSIS — B181 Chronic viral hepatitis B without delta-agent: Secondary | ICD-10-CM

## 2021-11-19 ENCOUNTER — Ambulatory Visit
Admission: RE | Admit: 2021-11-19 | Discharge: 2021-11-19 | Disposition: A | Discharge status: Home / Self Care | Payer: Medicare HMO | Source: Ambulatory Visit | Attending: Internal Medicine | Admitting: Internal Medicine

## 2021-11-19 DIAGNOSIS — K7460 Unspecified cirrhosis of liver: Secondary | ICD-10-CM | POA: Diagnosis not present

## 2021-11-19 DIAGNOSIS — Z9189 Other specified personal risk factors, not elsewhere classified: Secondary | ICD-10-CM

## 2021-11-19 DIAGNOSIS — B181 Chronic viral hepatitis B without delta-agent: Secondary | ICD-10-CM

## 2021-12-01 ENCOUNTER — Other Ambulatory Visit (HOSPITAL_COMMUNITY): Payer: Self-pay | Admitting: *Deleted

## 2021-12-01 DIAGNOSIS — F333 Major depressive disorder, recurrent, severe with psychotic symptoms: Secondary | ICD-10-CM

## 2021-12-01 MED ORDER — ARIPIPRAZOLE 2 MG PO TABS: 2.0000 mg | ORAL_TABLET | Freq: Every day | ORAL | 1 refills | Status: DC

## 2021-12-13 ENCOUNTER — Other Ambulatory Visit (HOSPITAL_COMMUNITY): Payer: Self-pay

## 2022-01-06 ENCOUNTER — Encounter (HOSPITAL_COMMUNITY): Payer: Self-pay | Admitting: Psychiatry

## 2022-01-06 ENCOUNTER — Ambulatory Visit (HOSPITAL_BASED_OUTPATIENT_CLINIC_OR_DEPARTMENT_OTHER): Payer: Medicare HMO | Admitting: Psychiatry

## 2022-01-06 VITALS — BP 156/79 | HR 80 | Resp 18 | Ht 62.0 in | Wt 170.0 lb

## 2022-01-06 DIAGNOSIS — F410 Panic disorder [episodic paroxysmal anxiety] without agoraphobia: Secondary | ICD-10-CM | POA: Diagnosis not present

## 2022-01-06 DIAGNOSIS — F5101 Primary insomnia: Secondary | ICD-10-CM

## 2022-01-06 DIAGNOSIS — F411 Generalized anxiety disorder: Secondary | ICD-10-CM | POA: Diagnosis not present

## 2022-01-06 DIAGNOSIS — R69 Illness, unspecified: Secondary | ICD-10-CM | POA: Diagnosis not present

## 2022-01-06 DIAGNOSIS — F333 Major depressive disorder, recurrent, severe with psychotic symptoms: Secondary | ICD-10-CM

## 2022-01-06 MED ORDER — ARIPIPRAZOLE 2 MG PO TABS: 2.0000 mg | ORAL_TABLET | Freq: Every day | ORAL | 1 refills | Status: DC

## 2022-01-06 MED ORDER — SERTRALINE HCL 100 MG PO TABS: 100.0000 mg | ORAL_TABLET | Freq: Every day | ORAL | 1 refills | Status: DC

## 2022-01-06 MED ORDER — ARIPIPRAZOLE 2 MG PO TABS: 2.0000 mg | ORAL_TABLET | Freq: Every day | ORAL | 0 refills | Status: DC

## 2022-01-06 NOTE — Progress Notes (Signed)
[facility] MD/PA/NP OP Progress Note  01/06/2022 4:53 PM Brooke Huber  MRN:  [id number]  Chief Complaint:  Chief Complaint  Patient presents with   Follow-up   Insomnia   HPI: Brooke Huber shares she is doing well. She had a good Thanksgiving with her family. She denies depression. The last time was over 2 months ago and it was mild. When depressed she feels sad, unenergetic and watch more tv. It lasts for 1 day at most. Brooke Huber is sleeping poor. She has middle of the night insomnia. It takes her hours to fall back asleep. She denies isolation, anhedonia and hopelessness. She denies SI/HI. Her anxiety is mild and is mostly situational. It is manageable when it happens. Brooke Huber is taking her meds daily and denies SE. She denies any recent falls.    Visit Diagnosis:    ICD-10-CM   1. Primary insomnia  F51.01     2. Severe episode of recurrent major depressive disorder, with psychotic features (HCC)  F33.3 sertraline (ZOLOFT) 100 MG tablet    ARIPiprazole (ABILIFY) 2 MG tablet    DISCONTINUED: ARIPiprazole (ABILIFY) 2 MG tablet    3. Panic disorder without agoraphobia  F41.0 sertraline (ZOLOFT) 100 MG tablet    4. GAD (generalized anxiety disorder)  F41.1 sertraline (ZOLOFT) 100 MG tablet      Past Psychiatric History: no updates. See H&P  Past Medical History:  Past Medical History:  Diagnosis Date   Arthritis 02/1995   Diagnosed in hands    Cancer [facility])    breast lymphoma with mets   Depression    GERD (gastroesophageal reflux disease)    Hepatitis    Hepatitis B 02/1995   Seizures (HCC)    hx of seizure in 1993 and 1994 - caused by stress per patient     Past Surgical History:  Procedure Laterality Date   APPENDECTOMY     COLONOSCOPY WITH PROPOFOL N/A 12/29/2015   Procedure: COLONOSCOPY WITH PROPOFOL;  Surgeon: Garlan Fair, MD;  Location: [facility];  Service: Endoscopy;  Laterality: N/A;   TONSILLECTOMY AND ADENOIDECTOMY Bilateral 1954   TUBAL LIGATION       Family Psychiatric and Medical History:  Family History  Problem Relation Age of Onset   Alzheimer's disease Sister    Dementia Sister    Sexual abuse Sister    Seizures Sister    Alcohol abuse Brother    Alcohol abuse Maternal Uncle    Drug abuse Cousin    Drug abuse Son    Depression Neg Hx     Social History:  Social History   Socioeconomic History   Marital status: Single    Spouse name: Not on file   Number of children: Not on file   Years of education: Not on file   Highest education level: Not on file  Occupational History   Not on file  Tobacco Use   Smoking status: Never   Smokeless tobacco: Never  Vaping Use   Vaping Use: Never used  Substance and Sexual Activity   Alcohol use: No   Drug use: No   Sexual activity: Never    Birth control/protection: None  Other Topics Concern   Not on file  Social History Narrative   Not on file   Social Determinants of Health   Financial Resource Strain: Not on file  Food Insecurity: Not on file  Transportation Needs: Not on file  Physical Activity: Not on file  Stress: Not on file  Social Connections: Not  on file    Allergies:  Allergies  Allergen Reactions   Penicillins Hives and Rash    Did it involve swelling of the face/tongue/throat, SOB, or low BP? N Did it involve sudden or severe rash/hives, skin peeling, or any reaction on the inside of your mouth or nose? Y Did you need to seek medical attention at a hospital or doctor's office? N When did it last happen?       If all above answers are "NO", may proceed with cephalosporin use.     Metabolic Disorder Labs: Lab Results  Component Value Date   HGBA1C 5.5 10/06/2016   MPG 108 11/10/2015   MPG 123 06/10/2014   Lab Results  Component Value Date   PROLACTIN 37.0 (H) 10/06/2016   PROLACTIN 22.1 11/10/2015   Lab Results  Component Value Date   CHOL 140 10/06/2016   TRIG 81 10/06/2016   HDL 38 (L) 10/06/2016   CHOLHDL 2.7 06/10/2014    VLDL 14 06/10/2014   LDLCALC 86 10/06/2016   LDLCALC 67 06/10/2014   Lab Results  Component Value Date   TSH 1.350 10/06/2016   TSH 1.42 11/10/2015    Therapeutic Level Labs: No results found for: "LITHIUM" No results found for: "VALPROATE" No results found for: "CBMZ"  Current Medications: Current Outpatient Medications  Medication Sig Dispense Refill   Calcium Carb-Cholecalciferol 600-100 MG-UNIT CAPS Take 1 tablet by mouth daily.     Cholecalciferol (VITAMIN D-400 PO) Take by mouth. 1x/ day     famotidine (PEPCID) 20 MG tablet Take 20 mg by mouth 2 (two) times daily.     Multiple Vitamin (MULTIVITAMIN WITH MINERALS) TABS tablet Take 1 tablet by mouth daily. 30 tablet 0   polyethylene glycol (MIRALAX / GLYCOLAX) 17 g packet Take 17 g by mouth daily. 14 each 0   senna-docusate (SENOKOT-S) 8.6-50 MG tablet Take 1 tablet by mouth at bedtime as needed for mild constipation. 30 tablet 0   Tenofovir Alafenamide Fumarate (VEMLIDY) 25 MG TABS Take 1 tablet (25 mg total) by mouth daily. 30 tablet 6   ARIPiprazole (ABILIFY) 2 MG tablet Take 1 tablet (2 mg total) by mouth daily. 90 tablet 0   sertraline (ZOLOFT) 100 MG tablet Take 1 tablet (100 mg total) by mouth daily. 90 tablet 1   No current facility-administered medications for this visit.   Facility-Administered Medications Ordered in Other Visits  Medication Dose Route Frequency Provider Last Rate Last Admin   pneumococcal 13-valent conjugate vaccine (PREVNAR 13) injection 0.5 mL  0.5 mL Intramuscular Once Irene Limbo Cloria Spring, MD         Musculoskeletal: Strength & Muscle Tone: within normal limits Gait & Station: normal Patient leans: straight  Psychiatric Specialty Exam: Review of Systems  Blood pressure (!) 156/79, pulse 80, resp. rate 18, height '5\' 2"'$  (1.575 m), weight 170 lb (77.1 kg), SpO2 96 %.Body mass index is 31.09 kg/m.  General Appearance: Neat and Well Groomed  Eye Contact:  Good  Speech:  Clear and Coherent  and Slow  Volume:  Normal  Mood:  Euthymic  Affect:  Blunt  Thought Process: slow, concrete Coherent, Linear, and Descriptions of Associations: Intact  Orientation:  Full (Time, Place, and Person)  Thought Content: Logical   Suicidal Thoughts:  No  Homicidal Thoughts:  No  Memory:  Immediate;   Good  Judgement:  Good  Insight:  Good  Psychomotor Activity:  Normal  Concentration:  Concentration: Good  Recall:  Good  Fund of  Knowledge: Good  Language: Good  Akathisia:  No  Handed:  Right  AIMS (if indicated): not done  Assets:  Communication Skills Desire for Improvement Financial Resources/Insurance Housing Leisure Time Resilience Social Support Talents/Skills Transportation Vocational/Educational  ADL's:  Intact  Cognition: WNL  Sleep:  Poor   Screenings: [place] Office Visit from 10/06/2016 in [facility] Admission (Discharged) from 06/08/2014 in [facility]  AIMS Total Score 0 0      AUDIT    Flowsheet Row Admission (Discharged) from 06/08/2014 in [facility]  Alcohol Use Disorder Identification Test Final Score (AUDIT) 0      PHQ2-9    Flowsheet Row Video Visit from 08/12/2021 in [facility] Video Visit from 05/27/2021 in [facility] Office Visit from 04/20/2021 in [facility] Video Visit from 04/15/2021 in [facility] Video Visit from 05/07/2020 in [facility]  PHQ-2 Total Score 0 0 0 0 0      Flowsheet Row Video Visit from 08/12/2021 in [facility] Video Visit from 05/27/2021 in [facility] Video Visit from 04/15/2021 in [place] No Risk No Risk No Risk        Assessment and Plan:    Medication management with supportive therapy. Risks and benefits, side effects and alternative treatment options discussed with patient. Pt was given an opportunity to ask questions about medication, illness, and treatment. All current psychiatric medications have been reviewed and discussed with the patient and adjusted as clinically appropriate. The patient has been provided an accurate and updated list of the medications being now prescribed. Pt verbalized understanding and verbal consent obtained for treatment.   Pt is aware that these meds carry a teratogenic risk. Pt will discuss plan of action if she does or plans to become pregnant in the future.  Status of current problems: ongoing insomnia. Mood and anxiety are stable.  Meds: Breezy does not want to take any sleep aids   Labs: 09/20/21 creatinine 1.12, platelets 108- she is working with her PCP EKG 04/20/2021 Qtc 421  Therapy: brief supportive therapy provided. Discussed psychosocial stressors in detail.   Reviewed sleep hygiene in detail    F/up in 4-6 months months or sooner if needed  The duration of this appointment visit was 13 minutes of face-to-face time with the patient.  Greater than 50% of this time was spent in counseling, explanation of  diagnosis, planning of further management, and coordination of care    Collaboration of Care: Collaboration of Care: Other none  Patient/Guardian was advised Release of Information must be obtained prior to any record release in order to collaborate their care with an outside provider. Patient/Guardian was advised if they have not already done so to contact the registration department to sign all necessary forms in order for [name] to release information regarding their care.   Consent: Patient/Guardian gives verbal consent for treatment and assignment of benefits for services provided during this visit.  Patient/Guardian expressed understanding and agreed to proceed.    Charlcie Cradle, MD 01/06/2022, 4:53 PM

## 2022-02-09 DIAGNOSIS — M1990 Unspecified osteoarthritis, unspecified site: Secondary | ICD-10-CM | POA: Diagnosis not present

## 2022-02-09 DIAGNOSIS — Z8572 Personal history of non-Hodgkin lymphomas: Secondary | ICD-10-CM | POA: Diagnosis not present

## 2022-02-09 DIAGNOSIS — Z880 Allergy status to penicillin: Secondary | ICD-10-CM | POA: Diagnosis not present

## 2022-02-09 DIAGNOSIS — Z6831 Body mass index (BMI) 31.0-31.9, adult: Secondary | ICD-10-CM | POA: Diagnosis not present

## 2022-02-09 DIAGNOSIS — K5900 Constipation, unspecified: Secondary | ICD-10-CM | POA: Diagnosis not present

## 2022-02-09 DIAGNOSIS — F319 Bipolar disorder, unspecified: Secondary | ICD-10-CM | POA: Diagnosis not present

## 2022-02-09 DIAGNOSIS — B181 Chronic viral hepatitis B without delta-agent: Secondary | ICD-10-CM | POA: Diagnosis not present

## 2022-02-09 DIAGNOSIS — K219 Gastro-esophageal reflux disease without esophagitis: Secondary | ICD-10-CM | POA: Diagnosis not present

## 2022-02-09 DIAGNOSIS — Z803 Family history of malignant neoplasm of breast: Secondary | ICD-10-CM | POA: Diagnosis not present

## 2022-02-09 DIAGNOSIS — Z820 Family history of epilepsy and other diseases of the nervous system: Secondary | ICD-10-CM | POA: Diagnosis not present

## 2022-02-09 DIAGNOSIS — E669 Obesity, unspecified: Secondary | ICD-10-CM | POA: Diagnosis not present

## 2022-02-09 DIAGNOSIS — H9193 Unspecified hearing loss, bilateral: Secondary | ICD-10-CM | POA: Diagnosis not present

## 2022-02-09 DIAGNOSIS — R69 Illness, unspecified: Secondary | ICD-10-CM | POA: Diagnosis not present

## 2022-02-15 ENCOUNTER — Other Ambulatory Visit (HOSPITAL_COMMUNITY): Payer: Self-pay

## 2022-02-15 ENCOUNTER — Telehealth: Payer: Self-pay

## 2022-02-15 NOTE — Telephone Encounter (Addendum)
[facility] Patient Advocate Encounter   Received notification from [facility] that prior authorization for [facility] is required.   PA submitted on 02/15/22 [place] [id number] Status is pending  Once approved call [facility] will continue to follow.   [name], [facility] Patient [facility] for Infectious Disease Phone: [phone] Fax:  [phone]

## 2022-02-17 ENCOUNTER — Telehealth: Payer: Self-pay

## 2022-02-17 ENCOUNTER — Other Ambulatory Visit (HOSPITAL_COMMUNITY): Payer: Self-pay

## 2022-02-17 NOTE — Telephone Encounter (Signed)
[facility] Patient Advocate Encounter  Prior Authorization for Brooke Huber has been approved.    PA# [id number] Effective dates: 02/16/22 through 01/31/23  Patients co-pay is $250.00.   [facility] will continue to follow.  [name], [facility] Patient [facility] for Infectious Disease Phone: [phone] Fax:  [phone]

## 2022-02-24 ENCOUNTER — Telehealth: Payer: Self-pay

## 2022-02-24 NOTE — Telephone Encounter (Signed)
[facility] Patient Advocate Encounter   Was successful in obtaining a [place] copay card for [employer].  This copay card will make the patients copay 0.00.  I have spoken with the patient.    The billing information is as follows and has been shared with [employer].  RxBin: [id number] PCN: [id number] Member ID: [id number]  Group ID: [place], [place] Patient [facility] for Infectious Disease Phone: [phone] Fax:  [phone]

## 2022-03-08 ENCOUNTER — Telehealth: Payer: Self-pay

## 2022-03-08 NOTE — Telephone Encounter (Signed)
Pt called left VM wanting to confirm or check on her upcoming appt w/ Dr. Linus Salmons. Called pt back, no answer & left VM.

## 2022-03-17 ENCOUNTER — Other Ambulatory Visit: Payer: Self-pay

## 2022-03-17 DIAGNOSIS — C8218 Follicular lymphoma grade II, lymph nodes of multiple sites: Secondary | ICD-10-CM

## 2022-03-21 ENCOUNTER — Inpatient Hospital Stay: Payer: Medicare HMO

## 2022-03-21 ENCOUNTER — Inpatient Hospital Stay: Payer: Medicare HMO | Attending: Hematology | Admitting: Hematology

## 2022-03-21 VITALS — BP 151/73 | HR 65 | Temp 97.5°F | Resp 18 | Wt 168.1 lb

## 2022-03-21 DIAGNOSIS — Z79624 Long term (current) use of inhibitors of nucleotide synthesis: Secondary | ICD-10-CM | POA: Diagnosis not present

## 2022-03-21 DIAGNOSIS — B181 Chronic viral hepatitis B without delta-agent: Secondary | ICD-10-CM | POA: Insufficient documentation

## 2022-03-21 DIAGNOSIS — C8218 Follicular lymphoma grade II, lymph nodes of multiple sites: Secondary | ICD-10-CM

## 2022-03-21 DIAGNOSIS — Z8572 Personal history of non-Hodgkin lymphomas: Secondary | ICD-10-CM | POA: Diagnosis not present

## 2022-03-21 DIAGNOSIS — Z79899 Other long term (current) drug therapy: Secondary | ICD-10-CM | POA: Insufficient documentation

## 2022-03-21 DIAGNOSIS — D696 Thrombocytopenia, unspecified: Secondary | ICD-10-CM | POA: Diagnosis not present

## 2022-03-21 DIAGNOSIS — R69 Illness, unspecified: Secondary | ICD-10-CM | POA: Diagnosis not present

## 2022-03-21 LAB — CBC WITH DIFFERENTIAL (CANCER CENTER ONLY)
Abs Immature Granulocytes: 0.02 10*3/uL (ref 0.00–0.07)
Basophils Absolute: 0.1 10*3/uL (ref 0.0–0.1)
Basophils Relative: 1 %
Eosinophils Absolute: 0.1 10*3/uL (ref 0.0–0.5)
Eosinophils Relative: 2 %
HCT: 42.1 % (ref 36.0–46.0)
Hemoglobin: 14.5 g/dL (ref 12.0–15.0)
Immature Granulocytes: 0 %
Lymphocytes Relative: 13 %
Lymphs Abs: 0.6 10*3/uL — ABNORMAL LOW (ref 0.7–4.0)
MCH: 31.3 pg (ref 26.0–34.0)
MCHC: 34.4 g/dL (ref 30.0–36.0)
MCV: 90.7 fL (ref 80.0–100.0)
Monocytes Absolute: 0.4 10*3/uL (ref 0.1–1.0)
Monocytes Relative: 8 %
Neutro Abs: 3.8 10*3/uL (ref 1.7–7.7)
Neutrophils Relative %: 76 %
Platelet Count: 123 10*3/uL — ABNORMAL LOW (ref 150–400)
RBC: 4.64 MIL/uL (ref 3.87–5.11)
RDW: 12.6 % (ref 11.5–15.5)
WBC Count: 5.1 10*3/uL (ref 4.0–10.5)
nRBC: 0 % (ref 0.0–0.2)

## 2022-03-21 LAB — CMP (CANCER CENTER ONLY)
ALT: 11 U/L (ref 0–44)
AST: 19 U/L (ref 15–41)
Albumin: 4.1 g/dL (ref 3.5–5.0)
Alkaline Phosphatase: 65 U/L (ref 38–126)
Anion gap: 7 (ref 5–15)
BUN: 16 mg/dL (ref 8–23)
CO2: 31 mmol/L (ref 22–32)
Calcium: 9.1 mg/dL (ref 8.9–10.3)
Chloride: 105 mmol/L (ref 98–111)
Creatinine: 1.07 mg/dL — ABNORMAL HIGH (ref 0.44–1.00)
GFR, Estimated: 53 mL/min — ABNORMAL LOW (ref >60)
Glucose, Bld: 92 mg/dL (ref 70–99)
Potassium: 3.8 mmol/L (ref 3.5–5.1)
Sodium: 143 mmol/L (ref 135–145)
Total Bilirubin: 0.6 mg/dL (ref 0.3–1.2)
Total Protein: 6.7 g/dL (ref 6.5–8.1)

## 2022-03-21 LAB — LACTATE DEHYDROGENASE: LDH: 124 U/L (ref 98–192)

## 2022-03-21 NOTE — Progress Notes (Signed)
HEMATOLOGY/ONCOLOGY CLINIC NOTE  Date of Service: 03/21/22   Patient Care Team: Alroy Dust, L.Marlou Sa, MD as PCP - General (Family Medicine)  CHIEF COMPLAINTS/PURPOSE OF CONSULTATION:  Follow-up for continued evaluation and management of low-grade follicular lymphoma  HISTORY OF PRESENTING ILLNESS:    Interval History:  [name] returns for her 14-monthfollow-up for continued evaluation and management of low-grade follicular lymphoma.  Patient was last seen by me on 09/20/2021 and she was doing well overall.   Patient reports he has been doing well overall without any new medical concerns since our last visit. She denies infection issues, fever, chills, night sweats, back pain, chest pain, or leg swelling.   She is complaint with the her medications. She has discontinued Aripiprazole 2 mg.    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   Diagnosed in hands    Cancer ([facility]    breast lymphoma with mets   Depression    GERD (gastroesophageal reflux disease)    Hepatitis    Hepatitis B 02/1995   Seizures (HCC)    hx of seizure in 1993 and 1994 - caused by stress per patient   -Diverticulitis  -Osteopenia  -Acute gastritis   SURGICAL HISTORY: Past Surgical History:  Procedure Laterality Date   APPENDECTOMY     COLONOSCOPY WITH PROPOFOL N/A 12/29/2015   Procedure: COLONOSCOPY WITH PROPOFOL;  Surgeon: MGarlan Fair MD;  Location[facility];  Service: Endoscopy;  Laterality: N/A;   TONSILLECTOMY AND ADENOIDECTOMY Bilateral 1954   TUBAL LIGATION      SOCIAL HISTORY: Social History   Socioeconomic History   Marital status: Single    Spouse name: Not on file   Number of children: Not on file   Years of education: Not on file   Highest education level: Not on file  Occupational History   Not on file  Tobacco Use   Smoking status: Never   Smokeless tobacco: Never  Vaping Use   Vaping Use: Never used  Substance  and Sexual Activity   Alcohol use: No   Drug use: No   Sexual activity: Never    Birth control/protection: None  Other Topics Concern   Not on file  Social History Narrative   Not on file   Social Determinants of Health   Financial Resource Strain: Not on file  Food Insecurity: Not on file  Transportation Needs: Not on file  Physical Activity: Not on file  Stress: Not on file  Social Connections: Not on file  Intimate Partner Violence: Not on file    FAMILY HISTORY: Family History  Problem Relation Age of Onset   Alzheimer's disease Sister    Dementia Sister    Sexual abuse Sister    Seizures Sister    Alcohol abuse Brother    Alcohol abuse Maternal Uncle    Drug abuse Cousin    Drug abuse Son    Depression Neg Hx     ALLERGIES:  is allergic to penicillins.  MEDICATIONS:  Current Outpatient Medications  Medication Sig Dispense Refill   ARIPiprazole (ABILIFY) 2 MG tablet Take 1 tablet (2 mg total) by mouth daily. 90 tablet 0   Calcium Carb-Cholecalciferol 600-100 MG-UNIT CAPS Take 1 tablet by mouth daily.     Cholecalciferol (VITAMIN D-400 PO) Take by mouth. 1x/ day     famotidine (PEPCID) 20 MG tablet Take 20 mg by mouth 2 (two) times daily.     Multiple Vitamin (MULTIVITAMIN  WITH MINERALS) TABS tablet Take 1 tablet by mouth daily. 30 tablet 0   polyethylene glycol (MIRALAX / GLYCOLAX) 17 g packet Take 17 g by mouth daily. 14 each 0   senna-docusate (SENOKOT-S) 8.6-50 MG tablet Take 1 tablet by mouth at bedtime as needed for mild constipation. 30 tablet 0   sertraline (ZOLOFT) 100 MG tablet Take 1 tablet (100 mg total) by mouth daily. 90 tablet 1   Tenofovir Alafenamide Fumarate (VEMLIDY) 25 MG TABS Take 1 tablet (25 mg total) by mouth daily. 30 tablet 6   No current facility-administered medications for this visit.   Facility-Administered Medications Ordered in Other Visits  Medication Dose Route Frequency Provider Last Rate Last Admin   pneumococcal 13-valent  conjugate vaccine (PREVNAR 13) injection 0.5 mL  0.5 mL Intramuscular Once Brunetta Genera, MD        REVIEW OF SYSTEMS:   10 Point review of Systems was done is negative except as noted above.  PHYSICAL EXAMINATION: ECOG PERFORMANCE STATUS: 1 - Symptomatic but completely ambulatory  Vitals:   03/21/22 0920  BP: (!) 151/73  Pulse: 65  Resp: 18  Temp: (!) 97.5 F (36.4 C)  SpO2: 96%   Filed Weights   03/21/22 0920  Weight: 168 lb 1.6 oz (76.2 kg)   .Body mass index is 30.75 kg/m.  NAD GENERAL:alert, in no acute distress and comfortable SKIN: no acute rashes, no significant lesions EYES: conjunctiva are pink and non-injected, sclera anicteric OROPHARYNX: MMM, no exudates, no oropharyngeal erythema or ulceration NECK: supple, no JVD LYMPH:  no palpable lymphadenopathy in the cervical, axillary or inguinal regions LUNGS: clear to auscultation b/l with normal respiratory effort HEART: regular rate & rhythm ABDOMEN:  normoactive bowel sounds , non tender, not distended. Extremity: no pedal edema PSYCH: alert & oriented x 3 with fluent speech NEURO: no focal motor/sensory deficits   LABORATORY DATA:  I have reviewed the data as listed  .    Latest Ref Rng & Units 03/21/2022    8:48 AM 09/20/2021    8:59 AM 02/26/2021    9:35 AM  CBC  WBC 4.0 - 10.5 K/uL 5.1  4.0  5.7   Hemoglobin 12.0 - 15.0 g/dL 14.5  13.4  13.6   Hematocrit 36.0 - 46.0 % 42.1  39.5  40.6   Platelets 150 - 400 K/uL 123  108  116    ANC 200 .    Latest Ref Rng & Units 03/21/2022    8:48 AM 09/20/2021    8:59 AM 02/26/2021    9:35 AM  CMP  Glucose 70 - 99 mg/dL 92  94  98   BUN 8 - 23 mg/dL '16  15  19   '$ Creatinine 0.44 - 1.00 mg/dL 1.07  1.12  1.05   Sodium 135 - 145 mmol/L 143  142  142   Potassium 3.5 - 5.1 mmol/L 3.8  4.0  4.0   Chloride 98 - 111 mmol/L 105  107  107   CO2 22 - 32 mmol/L 31  32  29   Calcium 8.9 - 10.3 mg/dL 9.1  9.5  9.6   Total Protein 6.5 - 8.1 g/dL 6.7  6.4  6.7    Total Bilirubin 0.3 - 1.2 mg/dL 0.6  0.5  0.6   Alkaline Phos 38 - 126 U/L 65  63  60   AST 15 - 41 U/L '19  17  16   '$ ALT 0 - 44 U/L 11  10  9    . Lab Results  Component Value Date   LDH 124 03/21/2022    Component     Latest Ref Rng & Units 05/02/2017 06/29/2017  HBV DNA SERPL PCR-ACNC     IU/mL 290 HBV DNA not detected  HBV DNA SERPL PCR-LOG IU     log10 IU/mL 2.462 UNABLE TO CALCULATE  Test Info:      Comment Comment   Component     Latest Ref Rng & Units 07/12/2017  HBV DNA SERPL PCR-ACNC     IU/mL HBV DNA not detected  HBV DNA SERPL PCR-LOG IU     log10 IU/mL UNABLE TO CALCULATE  Test Info:      Comment   Component     Latest Ref Rng & Units 05/02/2017  HBV DNA SERPL PCR-ACNC     IU/mL 290  HBV DNA SERPL PCR-LOG IU     log10 IU/mL 2.462  Test Info:      Comment  Hep B Core Ab, IgM     Negative Negative  Hep B Core Ab, Tot     Negative Positive (A)  Hepatitis B Surface Ag     Negative Confirm. indicated  HCV Ab     0.0 - 0.9 s/co ratio <0.1  HBsAg Conf      Positive (A)    PATHOLOGY    Diagnosis 04/25/17  Lymph node, needle/core biopsy, left - FOLLICULAR LYMPHOMA, GRADE 1-2. - SEE ONCOLOGY TABLE. Microscopic Comment LYMPHOMA Histologic type: Non-Hodgkin B-cell lymphoma: Follicular lymphoma. Grade (if applicable): Low grade (grade 1-2). Flow cytometry: N/A. Immunohistochemical stains: CD20, CD3, CD5, CD10, CD23, CD43, bcl-2, bcl-6, CD138, light chains, Ki-67. Touch preps/imprints: N/A. Comments: Core biopsies reveal effacement by a vaguely nodular infiltrate of atypical lymphocytes. The lymphocytes are predominately small with irregular nuclear contours. There are no diffuse areas of large cells. Immunohistochemistry reveals abundant CD20-positive B-cells. CD3, CD43, and CD5 highlight interfollicular T-cells. Bcl-6 and CD23 reveal numerous small follicles with bcl-6 positive cells extending outside the follicular dendritic networks. The majority of  these cells are bcl-2 positive. CD10 reveals only a few small residual germinal center foci. CD138 highlights scattered plasma cells which are polytypic by light chain in situ hybridization. The overall findings are most consistent with a low grade follicular lymphoma. Dr. Isaiah Blakes was notified on 04/27/2017.   RADIOGRAPHIC STUDIES: I have personally reviewed the radiological images as listed and agreed with the findings in the report. No results found.   Screening Mammogram 04/17/17 IMPRESSION:  The New enlarged nodes in both axillae are indeterminate. An [name] is recommended.    ASSESSMENT & PLAN:   BURNA HEUTON is a 77 y.o. Caucasian female with   1. 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  10/18/17 PET/CT revealed No measurable metabolic activity within previously enlarged cervical, axillary, mediastinal, and abdominal and pelvic lymph nodes ( [place] 1). 2. Findings support complete response to therapy. 3. Spleen is normal size with normal metabolic activity. 4. No evidence of marrow involvement.   The pt reached complete metabolic response after three cycles of Bendamustine/Rituxan and 2 cycles of only Rituxan (due to neutropenia)  05/16/18 CT C/A/P revealed No residual or recurrent lymphadenopathy in the chest, abdomen,  or pelvis. 2. Mild splenomegaly, maximum span 13.2 cm, significantly decreased in size compared to prior examinations.   2. Mild thrombocytopenia--much worse previously improved significantly when her lymphoma was treated.  Currently mild thrombocytopenia likely from tenofovir and hep B related liver disease   3. Chronic active  Hepatitis B -Hepatitis B labs from 05/02/17 revealed chronic active disease which will be a limiting factor in her treatment.  -Last hepatitis B DNA viral load suppressed on 04/23/2020 -Continues to be on tenofovir as per infectious disease Ultrasound abdomen 10/13/2020 no evidence of suspicious lesions for [facility]  PLAN: -Discussed lab results from today, 03/21/2022, with the patient. CBC is stable. Platelets are slightly decreased at 123, but has improved from last visit.  CMP is stable. -And has no clinical or lab evidence of follicular lymphoma progression at this time. --She is continue to follow with infectious disease for continued management of her hepatitis B -No indication for additional treatment of her follicular lymphoma at this time. --Answered all of patient's questions.  -RTC in one year.    FOLLOW-UP: Return to clinic with Dr. Irene Limbo with labs in 12 months  The total time spent in the appointment was 21 minutes* .  All of the patient's questions were answered with apparent satisfaction. The patient knows to call the clinic with any problems, questions or concerns.   Sullivan Lone MD MS AAHIVMS [facility] Hematology/Oncology Physician [facility]  .*Total Encounter Time as defined by the Centers for Medicare and Medicaid Services includes, in addition to the face-to-face time of a patient visit (documented in the note above) non-face-to-face time: obtaining and reviewing outside history, ordering and reviewing medications, tests or procedures, care coordination (communications with other health care professionals or caregivers) and documentation in the medical record.   I, Cleda Mccreedy, am acting as a Education administrator for Sullivan Lone, MD. .I have reviewed the above documentation for accuracy and completeness, and I agree with the above. Brunetta Genera MD

## 2022-04-12 ENCOUNTER — Encounter: Payer: Self-pay | Admitting: Internal Medicine

## 2022-04-12 ENCOUNTER — Other Ambulatory Visit: Payer: Self-pay

## 2022-04-12 ENCOUNTER — Ambulatory Visit: Payer: Medicare HMO | Admitting: Internal Medicine

## 2022-04-12 VITALS — BP 131/83 | HR 72 | Temp 98.6°F | Wt 168.3 lb

## 2022-04-12 DIAGNOSIS — Z9189 Other specified personal risk factors, not elsewhere classified: Secondary | ICD-10-CM

## 2022-04-12 DIAGNOSIS — B181 Chronic viral hepatitis B without delta-agent: Secondary | ICD-10-CM

## 2022-04-12 DIAGNOSIS — R69 Illness, unspecified: Secondary | ICD-10-CM | POA: Diagnosis not present

## 2022-04-12 MED ORDER — VEMLIDY 25 MG PO TABS: 1.0000 | ORAL_TABLET | Freq: Every day | ORAL | 11 refills | Status: DC

## 2022-04-12 NOTE — Progress Notes (Signed)
   Subjective:    Patient ID: [name], female    DOB: 05/25/1945, 76 y.o.   MRN: [id number]  HPI Here for follow up of chronic hepatitis B She has a history of lymphoma and had been started on suppressive tenofovir at that time for treatment then developed a flare of hepatitis B after stopping medication.  She was put back on it with a  Plan to remain on it lifelong.   She continues on it with no new concerns.  Ultrasounds every 6 months and no concerning findings.  No complaints.  Recent labs with normal AST and ALT     Review of Systems  Constitutional:  Negative for fatigue.  Gastrointestinal:  Negative for diarrhea and nausea.       Objective:   Physical Exam Constitutional:      Appearance: Normal appearance.  Eyes:     General: No scleral icterus. Pulmonary:     Effort: Pulmonary effort is normal.  Psychiatric:        Mood and Affect: Mood normal.    SH: no alcohol        Assessment & Plan:

## 2022-04-12 NOTE — Assessment & Plan Note (Signed)
Will recheck her labs today and reviewed recent LFTs and wnl.  Follow up in 1 year

## 2022-04-12 NOTE — Assessment & Plan Note (Signed)
Will conitnue with twice yearly [place] screening with ultrasound and will arrange today  I have personally spent 30 minutes involved in face-to-face and non-face-to-face activities for this patient on the day of the visit. Professional time spent includes the following activities: Preparing to see the patient (review of tests), Obtaining and/or reviewing separately obtained history (admission/discharge record), Performing a medically appropriate examination and/or evaluation , Ordering medications/tests/procedures, referring and communicating with other health care professionals, Documenting clinical information in the EMR, Independently interpreting results (not separately reported), Communicating results to the patient/family/caregiver, Counseling and educating the patient/family/caregiver and Care coordination (not separately reported).

## 2022-04-15 LAB — HEPATITIS B DNA, ULTRAQUANTITATIVE, PCR
Hepatitis B DNA: NOT DETECTED IU/mL
Hepatitis B virus DNA: NOT DETECTED Log IU/mL

## 2022-04-15 LAB — HEPATITIS B SURFACE ANTIGEN: Hepatitis B Surface Ag: REACTIVE — AB

## 2022-05-05 ENCOUNTER — Ambulatory Visit (HOSPITAL_COMMUNITY): Payer: Medicare HMO | Admitting: Psychiatry

## 2022-05-05 DIAGNOSIS — F411 Generalized anxiety disorder: Secondary | ICD-10-CM | POA: Diagnosis not present

## 2022-05-05 DIAGNOSIS — R69 Illness, unspecified: Secondary | ICD-10-CM | POA: Diagnosis not present

## 2022-05-05 DIAGNOSIS — F410 Panic disorder [episodic paroxysmal anxiety] without agoraphobia: Secondary | ICD-10-CM

## 2022-05-05 DIAGNOSIS — F333 Major depressive disorder, recurrent, severe with psychotic symptoms: Secondary | ICD-10-CM | POA: Diagnosis not present

## 2022-05-05 MED ORDER — SERTRALINE HCL 100 MG PO TABS: 100.0000 mg | ORAL_TABLET | Freq: Every day | ORAL | 1 refills | Status: DC

## 2022-05-05 MED ORDER — ARIPIPRAZOLE 2 MG PO TABS: 2.0000 mg | ORAL_TABLET | Freq: Every day | ORAL | 1 refills | Status: DC

## 2022-05-05 NOTE — Progress Notes (Signed)
[facility] MD/PA/NP OP Progress Note  05/05/2022 10:51 AM Brooke Huber  MRN:  [id number]  Chief Complaint:  Chief Complaint  Patient presents with   Follow-up   HPI: Brooke Huber shares she is doing well. She has been keeping busy by visiting with a friend 1-x/week. Brooke Huber also likes to visit her sons about 1x/month. She really enjoys it. She denies anhedonia. She is getting about 6-7 hr of sleep a night. It takes a while to fall asleep and then she wakes 3am-5am. It takes her a long while to fall back asleep. She denies naps. She is eating well and states her energy is fair. Brooke Huber is mildly depressed and feels down about 1/week. On that day she has no motivation and does not go out. She will watch tv and do some word finds. She denies passive thoughts of death. She denies SI/HI. Brooke Huber denies anxiety and panic attacks in several months.    Visit Diagnosis:    ICD-10-CM   1. Severe episode of recurrent major depressive disorder, with psychotic features  F33.3 ARIPiprazole (ABILIFY) 2 MG tablet    sertraline (ZOLOFT) 100 MG tablet    2. Panic disorder without agoraphobia  F41.0 sertraline (ZOLOFT) 100 MG tablet    3. GAD (generalized anxiety disorder)  F41.1 sertraline (ZOLOFT) 100 MG tablet      Past Psychiatric History: no update per patient.   Past Medical History:  Past Medical History:  Diagnosis Date   Arthritis 02/1995   Diagnosed in hands    Cancer [facility])    breast lymphoma with mets   Depression    GERD (gastroesophageal reflux disease)    Hepatitis    Hepatitis B 02/1995   Seizures (HCC)    hx of seizure in 1993 and 1994 - caused by stress per patient     Past Surgical History:  Procedure Laterality Date   APPENDECTOMY     COLONOSCOPY WITH PROPOFOL N/A 12/29/2015   Procedure: COLONOSCOPY WITH PROPOFOL;  Surgeon: Garlan Fair, MD;  Location: [facility];  Service: Endoscopy;  Laterality: N/A;   TONSILLECTOMY AND ADENOIDECTOMY Bilateral 1954   TUBAL LIGATION       Family Psychiatric and Medical History:  Family History  Problem Relation Age of Onset   Alzheimer's disease Sister    Dementia Sister    Sexual abuse Sister    Seizures Sister    Alcohol abuse Brother    Alcohol abuse Maternal Uncle    Drug abuse Cousin    Drug abuse Son    Depression Neg Hx     Social History:  Social History   Socioeconomic History   Marital status: Single    Spouse name: Not on file   Number of children: Not on file   Years of education: Not on file   Highest education level: Not on file  Occupational History   Not on file  Tobacco Use   Smoking status: Never   Smokeless tobacco: Never  Vaping Use   Vaping Use: Never used  Substance and Sexual Activity   Alcohol use: No   Drug use: No   Sexual activity: Never    Birth control/protection: None  Other Topics Concern   Not on file  Social History Narrative   Not on file   Social Determinants of Health   Financial Resource Strain: Not on file  Food Insecurity: Not on file  Transportation Needs: Not on file  Physical Activity: Not on file  Stress: Not on file  Social Connections: Not on file    Allergies:  Allergies  Allergen Reactions   Penicillins Hives and Rash    Did it involve swelling of the face/tongue/throat, SOB, or low BP? N Did it involve sudden or severe rash/hives, skin peeling, or any reaction on the inside of your mouth or nose? Y Did you need to seek medical attention at a hospital or doctor's office? N When did it last happen?       If all above answers are "NO", may proceed with cephalosporin use.     Metabolic Disorder Labs: Lab Results  Component Value Date   HGBA1C 5.5 10/06/2016   MPG 108 11/10/2015   MPG 123 06/10/2014   Lab Results  Component Value Date   PROLACTIN 37.0 (H) 10/06/2016   PROLACTIN 22.1 11/10/2015   Lab Results  Component Value Date   CHOL 140 10/06/2016   TRIG 81 10/06/2016   HDL 38 (L) 10/06/2016   CHOLHDL 2.7 06/10/2014    VLDL 14 06/10/2014   LDLCALC 86 10/06/2016   LDLCALC 67 06/10/2014   Lab Results  Component Value Date   TSH 1.350 10/06/2016   TSH 1.42 11/10/2015    Therapeutic Level Labs: No results found for: "LITHIUM" No results found for: "VALPROATE" No results found for: "CBMZ"  Current Medications: Current Outpatient Medications  Medication Sig Dispense Refill   ascorbic acid (VITAMIN C) 500 MG tablet 1 tablet Orally Once a day     Calcium Carb-Cholecalciferol 600-100 MG-UNIT CAPS Take 1 tablet by mouth daily.     famotidine (PEPCID) 20 MG tablet Take 20 mg by mouth 2 (two) times daily.     ketoconazole (NIZORAL) 2 % cream 1 application to affected area Externally Once a day     Multiple Vitamin (MULTIVITAMIN WITH MINERALS) TABS tablet Take 1 tablet by mouth daily. 30 tablet 0   polyethylene glycol (MIRALAX / GLYCOLAX) 17 g packet Take 17 g by mouth daily. 14 each 0   senna-docusate (SENOKOT-S) 8.6-50 MG tablet Take 1 tablet by mouth at bedtime as needed for mild constipation. 30 tablet 0   Tenofovir Alafenamide Fumarate (VEMLIDY) 25 MG TABS Take 1 tablet (25 mg total) by mouth daily. 30 tablet 11   triamcinolone cream (KENALOG) 0.1 % 1 application sparingly to affected area Externally Twice a day as needed     ARIPiprazole (ABILIFY) 2 MG tablet Take 1 tablet (2 mg total) by mouth daily. 90 tablet 1   Cholecalciferol (VITAMIN D-400 PO) Take by mouth. 1x/ day (Patient not taking: Reported on 05/05/2022)     sertraline (ZOLOFT) 100 MG tablet Take 1 tablet (100 mg total) by mouth daily. 90 tablet 1   No current facility-administered medications for this visit.   Facility-Administered Medications Ordered in Other Visits  Medication Dose Route Frequency Provider Last Rate Last Admin   pneumococcal 13-valent conjugate vaccine (PREVNAR 13) injection 0.5 mL  0.5 mL Intramuscular Once Brunetta Genera, MD         Musculoskeletal: Strength & Muscle Tone: within normal limits Gait & Station:  normal Patient leans:  straight  Psychiatric Specialty Exam: Review of Systems  Blood pressure 112/76, pulse 68, resp. rate 18, height 5\' 2"  (1.575 m), weight 163 lb (73.9 kg), SpO2 95 %.Body mass index is 29.81 kg/m.  General Appearance: Casual and Fairly Groomed  Eye Contact:  Good  Speech:  Slow  Volume:  Normal  Mood:  Euthymic  Affect:  Blunt  Thought Process:  Goal Directed,  Linear, and Descriptions of Associations: Intact  Orientation:  Full (Time, Place, and Person)  Thought Content: Logical   Suicidal Thoughts:  No  Homicidal Thoughts:  No  Memory:  Immediate;   Good  Judgement:  Good  Insight:  Good  Psychomotor Activity:  Normal  Concentration:  Concentration: Good  Recall:  Good  Fund of Knowledge: Good  Language: Good  Akathisia:  No  Handed:  Right  AIMS (if indicated): done  Assets:  Communication Skills Desire for Improvement Financial Resources/Insurance Housing Leisure Time Resilience Social Support Talents/Skills Transportation Vocational/Educational  ADL's:  Intact  Cognition: WNL  Sleep:  Poor   Screenings: [place] Office Visit from 10/06/2016 in [facility] Admission (Discharged) from 06/08/2014 in [facility]  AIMS Total Score 0 0      AUDIT    Flowsheet Row Admission (Discharged) from 06/08/2014 in [facility]  Alcohol Use Disorder Identification Test Final Score (AUDIT) 0      PHQ2-9    [place] Office Visit from 05/05/2022 in [facility] Office Visit from 04/12/2022 in [facility] Video Visit from 08/12/2021 in [facility] Video Visit from 05/27/2021 in [facility] Office Visit from 04/20/2021 in [facility]  PHQ-2 Total  Score 1 0 0 0 0      [place] Office Visit from 05/05/2022 in [facility] Video Visit from 08/12/2021 in [facility] Video Visit from 05/27/2021 in [place] No Risk No Risk No Risk        Assessment and Plan: Confidentiality and exclusions reviewed with pt who verbalized understanding.   Medication management with supportive therapy. Risks and benefits, side effects and alternative treatment options discussed with patient. Pt was given an opportunity to ask questions about medication, illness, and treatment. All current psychiatric medications have been reviewed and discussed with the patient and adjusted as clinically appropriate. The patient has been provided an accurate and updated list of the medications being now prescribed. Pt verbalized understanding and verbal consent obtained for treatment.   Pt is aware that these meds carry a teratogenic risk. Pt will discuss plan of action if she does or plans to become pregnant in the future.  Status of current problems: stable    Labs: 03/21/22 creatinine 1.07, platelets 123 She is getting an EKG in July 2024 with her PCP  Therapy: brief supportive therapy provided. Discussed psychosocial stressors in detail    AIMS= 0 today  F/up in 4-6 months or sooner if needed  The duration of this appointment visit was 20 minutes of face-to-face time with the patient.  Greater than 50% of this time was spent in counseling, explanation of  diagnosis, planning of further management, and coordination of care    Collaboration of Care: Collaboration of Care: Other none today  Patient/Guardian was advised Release of Information must be obtained prior to any record release in order to collaborate their care with an outside provider. Patient/Guardian was advised if they have not already done so to contact the  registration department to sign all necessary forms in order for [name] to release information regarding their care.   Consent: Patient/Guardian gives verbal consent for treatment and assignment of benefits for services provided during this visit. Patient/Guardian  expressed understanding and agreed to proceed.    Charlcie Cradle, MD 05/05/2022, 10:51 AM

## 2022-05-06 ENCOUNTER — Ambulatory Visit
Admission: RE | Admit: 2022-05-06 | Discharge: 2022-05-06 | Disposition: A | Discharge status: Home / Self Care | Payer: Medicare HMO | Source: Ambulatory Visit | Attending: Internal Medicine | Admitting: Internal Medicine

## 2022-05-06 DIAGNOSIS — Z9189 Other specified personal risk factors, not elsewhere classified: Secondary | ICD-10-CM

## 2022-05-06 DIAGNOSIS — B181 Chronic viral hepatitis B without delta-agent: Secondary | ICD-10-CM

## 2022-05-06 DIAGNOSIS — N281 Cyst of kidney, acquired: Secondary | ICD-10-CM | POA: Diagnosis not present

## 2022-06-10 ENCOUNTER — Other Ambulatory Visit: Payer: Self-pay | Admitting: Internal Medicine

## 2022-06-10 DIAGNOSIS — B181 Chronic viral hepatitis B without delta-agent: Secondary | ICD-10-CM

## 2022-07-04 DIAGNOSIS — J069 Acute upper respiratory infection, unspecified: Secondary | ICD-10-CM | POA: Diagnosis not present

## 2022-07-04 DIAGNOSIS — R059 Cough, unspecified: Secondary | ICD-10-CM | POA: Diagnosis not present

## 2022-08-05 DIAGNOSIS — D696 Thrombocytopenia, unspecified: Secondary | ICD-10-CM | POA: Diagnosis not present

## 2022-08-05 DIAGNOSIS — K5900 Constipation, unspecified: Secondary | ICD-10-CM | POA: Diagnosis not present

## 2022-08-05 DIAGNOSIS — M8580 Other specified disorders of bone density and structure, unspecified site: Secondary | ICD-10-CM | POA: Diagnosis not present

## 2022-08-05 DIAGNOSIS — K219 Gastro-esophageal reflux disease without esophagitis: Secondary | ICD-10-CM | POA: Diagnosis not present

## 2022-08-05 DIAGNOSIS — E7800 Pure hypercholesterolemia, unspecified: Secondary | ICD-10-CM | POA: Diagnosis not present

## 2022-08-05 DIAGNOSIS — C8218 Follicular lymphoma grade II, lymph nodes of multiple sites: Secondary | ICD-10-CM | POA: Diagnosis not present

## 2022-08-05 DIAGNOSIS — B181 Chronic viral hepatitis B without delta-agent: Secondary | ICD-10-CM | POA: Diagnosis not present

## 2022-08-05 DIAGNOSIS — Z0000 Encounter for general adult medical examination without abnormal findings: Secondary | ICD-10-CM | POA: Diagnosis not present

## 2022-08-05 DIAGNOSIS — Z9181 History of falling: Secondary | ICD-10-CM | POA: Diagnosis not present

## 2022-08-05 DIAGNOSIS — B354 Tinea corporis: Secondary | ICD-10-CM | POA: Diagnosis not present

## 2022-08-15 DIAGNOSIS — Z1231 Encounter for screening mammogram for malignant neoplasm of breast: Secondary | ICD-10-CM | POA: Diagnosis not present

## 2022-09-08 ENCOUNTER — Telehealth (HOSPITAL_BASED_OUTPATIENT_CLINIC_OR_DEPARTMENT_OTHER): Payer: Medicare HMO | Admitting: Psychiatry

## 2022-09-08 ENCOUNTER — Encounter (HOSPITAL_COMMUNITY): Payer: Self-pay | Admitting: Psychiatry

## 2022-09-08 VITALS — Wt 163.0 lb

## 2022-09-08 DIAGNOSIS — F411 Generalized anxiety disorder: Secondary | ICD-10-CM

## 2022-09-08 DIAGNOSIS — F410 Panic disorder [episodic paroxysmal anxiety] without agoraphobia: Secondary | ICD-10-CM

## 2022-09-08 DIAGNOSIS — F333 Major depressive disorder, recurrent, severe with psychotic symptoms: Secondary | ICD-10-CM

## 2022-09-08 MED ORDER — SERTRALINE HCL 100 MG PO TABS
100.0000 mg | ORAL_TABLET | Freq: Every day | ORAL | 0 refills | Status: DC
Start: 2022-09-08 — End: 2022-12-08

## 2022-09-08 MED ORDER — ARIPIPRAZOLE 2 MG PO TABS
2.0000 mg | ORAL_TABLET | Freq: Every day | ORAL | 0 refills | Status: DC
Start: 2022-09-08 — End: 2022-12-08
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
## 2022-09-15 ENCOUNTER — Ambulatory Visit (HOSPITAL_COMMUNITY): Payer: Medicare HMO | Admitting: Psychiatry

## 2022-09-23 DIAGNOSIS — S90862A Insect bite (nonvenomous), left foot, initial encounter: Secondary | ICD-10-CM | POA: Diagnosis not present

## 2022-09-23 DIAGNOSIS — S90861A Insect bite (nonvenomous), right foot, initial encounter: Secondary | ICD-10-CM | POA: Diagnosis not present

## 2022-09-23 DIAGNOSIS — W57XXXA Bitten or stung by nonvenomous insect and other nonvenomous arthropods, initial encounter: Secondary | ICD-10-CM | POA: Diagnosis not present

## 2022-11-07 ENCOUNTER — Ambulatory Visit
Admission: RE | Admit: 2022-11-07 | Discharge: 2022-11-07 | Disposition: A | Discharge status: Home / Self Care | Payer: Medicare HMO | Source: Ambulatory Visit | Attending: Internal Medicine | Admitting: Internal Medicine

## 2022-11-07 DIAGNOSIS — B181 Chronic viral hepatitis B without delta-agent: Secondary | ICD-10-CM

## 2022-11-07 DIAGNOSIS — K7689 Other specified diseases of liver: Secondary | ICD-10-CM | POA: Diagnosis not present

## 2022-11-07 DIAGNOSIS — Z9189 Other specified personal risk factors, not elsewhere classified: Secondary | ICD-10-CM

## 2022-12-08 ENCOUNTER — Encounter (HOSPITAL_COMMUNITY): Payer: Self-pay | Admitting: Psychiatry

## 2022-12-08 ENCOUNTER — Ambulatory Visit (HOSPITAL_COMMUNITY): Payer: Medicare HMO | Admitting: Psychiatry

## 2022-12-08 VITALS — BP 124/76 | HR 70 | Resp 18 | Ht 62.0 in | Wt 162.6 lb

## 2022-12-08 DIAGNOSIS — F410 Panic disorder [episodic paroxysmal anxiety] without agoraphobia: Secondary | ICD-10-CM | POA: Diagnosis not present

## 2022-12-08 DIAGNOSIS — F333 Major depressive disorder, recurrent, severe with psychotic symptoms: Secondary | ICD-10-CM

## 2022-12-08 DIAGNOSIS — F411 Generalized anxiety disorder: Secondary | ICD-10-CM | POA: Diagnosis not present

## 2022-12-08 MED ORDER — ARIPIPRAZOLE 2 MG PO TABS
2.0000 mg | ORAL_TABLET | Freq: Every day | ORAL | 0 refills | Status: DC
Start: 2022-12-08 — End: 2023-04-06

## 2022-12-08 MED ORDER — SERTRALINE HCL 100 MG PO TABS
100.0000 mg | ORAL_TABLET | Freq: Every day | ORAL | 0 refills | Status: DC
Start: 2022-12-08 — End: 2023-04-06
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
## 2022-12-09 ENCOUNTER — Ambulatory Visit (HOSPITAL_COMMUNITY): Payer: Medicare HMO | Admitting: Psychiatry

## 2023-01-09 ENCOUNTER — Other Ambulatory Visit (HOSPITAL_COMMUNITY): Payer: Self-pay

## 2023-01-27 ENCOUNTER — Telehealth: Payer: Self-pay

## 2023-01-27 NOTE — Telephone Encounter (Signed)
[facility] Patient Advocate Encounter  Completed and sent Support Path application for [facility] for this patient who is insured.    Patient assistance phone number for follow up is [phone].   This encounter will be updated until final determination.   Clearance Coots, CPhT Specialty Pharmacy Patient [facility] for Infectious Disease Phone: [phone] Fax:  [phone]

## 2023-01-30 ENCOUNTER — Other Ambulatory Visit (HOSPITAL_COMMUNITY): Payer: Self-pay

## 2023-02-03 ENCOUNTER — Other Ambulatory Visit (HOSPITAL_COMMUNITY): Payer: Self-pay

## 2023-02-07 ENCOUNTER — Other Ambulatory Visit (HOSPITAL_COMMUNITY): Payer: Self-pay

## 2023-02-14 ENCOUNTER — Other Ambulatory Visit (HOSPITAL_COMMUNITY): Payer: Self-pay

## 2023-02-16 ENCOUNTER — Other Ambulatory Visit (HOSPITAL_COMMUNITY): Payer: Self-pay

## 2023-02-23 ENCOUNTER — Telehealth: Payer: Self-pay

## 2023-02-23 ENCOUNTER — Other Ambulatory Visit (HOSPITAL_COMMUNITY): Payer: Self-pay

## 2023-02-23 NOTE — Telephone Encounter (Signed)
[facility] Patient Advocate Encounter  Prior Authorization for [facility] has been approved.    PA# [id number] Effective dates: 02/22/23 through 01/31/24  Patients co-pay is $533.05.   [facility] will continue to follow.  Clearance Coots, CPhT Specialty Pharmacy Patient [facility] for Infectious Disease Phone: [phone] Fax:  [phone]

## 2023-02-27 ENCOUNTER — Other Ambulatory Visit (HOSPITAL_COMMUNITY): Payer: Self-pay

## 2023-03-13 ENCOUNTER — Other Ambulatory Visit (HOSPITAL_COMMUNITY): Payer: Self-pay

## 2023-03-14 ENCOUNTER — Encounter: Payer: Self-pay | Admitting: Internal Medicine

## 2023-03-14 ENCOUNTER — Other Ambulatory Visit (HOSPITAL_COMMUNITY): Payer: Self-pay

## 2023-03-20 ENCOUNTER — Telehealth: Payer: Self-pay | Admitting: Hematology

## 2023-03-20 NOTE — Telephone Encounter (Signed)
Patient is aware of rescheduled appointment times/dates 

## 2023-03-22 ENCOUNTER — Inpatient Hospital Stay: Payer: Self-pay

## 2023-03-22 ENCOUNTER — Inpatient Hospital Stay: Payer: Self-pay | Admitting: Hematology

## 2023-04-06 ENCOUNTER — Encounter (HOSPITAL_COMMUNITY): Payer: Self-pay | Admitting: Psychiatry

## 2023-04-06 ENCOUNTER — Other Ambulatory Visit: Payer: Self-pay

## 2023-04-06 ENCOUNTER — Other Ambulatory Visit (HOSPITAL_COMMUNITY): Payer: Self-pay

## 2023-04-06 ENCOUNTER — Ambulatory Visit (HOSPITAL_BASED_OUTPATIENT_CLINIC_OR_DEPARTMENT_OTHER): Payer: Medicare HMO | Admitting: Psychiatry

## 2023-04-06 DIAGNOSIS — F333 Major depressive disorder, recurrent, severe with psychotic symptoms: Secondary | ICD-10-CM

## 2023-04-06 DIAGNOSIS — F410 Panic disorder [episodic paroxysmal anxiety] without agoraphobia: Secondary | ICD-10-CM | POA: Diagnosis not present

## 2023-04-06 DIAGNOSIS — F411 Generalized anxiety disorder: Secondary | ICD-10-CM | POA: Diagnosis not present

## 2023-04-06 MED ORDER — ARIPIPRAZOLE 2 MG PO TABS: 2.0000 mg | ORAL_TABLET | Freq: Every day | ORAL | 0 refills | Status: DC

## 2023-04-06 MED ORDER — SERTRALINE HCL 100 MG PO TABS
100.0000 mg | ORAL_TABLET | Freq: Every day | ORAL | 0 refills | Status: DC
Start: 2023-04-06 — End: 2023-07-13
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
## 2023-04-14 ENCOUNTER — Other Ambulatory Visit: Payer: Self-pay

## 2023-04-14 DIAGNOSIS — C8218 Follicular lymphoma grade II, lymph nodes of multiple sites: Secondary | ICD-10-CM

## 2023-04-14 DIAGNOSIS — D696 Thrombocytopenia, unspecified: Secondary | ICD-10-CM

## 2023-04-17 ENCOUNTER — Inpatient Hospital Stay: Payer: Self-pay | Admitting: Hematology

## 2023-04-17 ENCOUNTER — Inpatient Hospital Stay: Payer: Self-pay | Attending: Hematology

## 2023-04-17 VITALS — BP 127/60 | HR 59 | Temp 97.6°F | Resp 16 | Wt 165.5 lb

## 2023-04-17 DIAGNOSIS — C8218 Follicular lymphoma grade II, lymph nodes of multiple sites: Secondary | ICD-10-CM

## 2023-04-17 DIAGNOSIS — Z8572 Personal history of non-Hodgkin lymphomas: Secondary | ICD-10-CM | POA: Insufficient documentation

## 2023-04-17 DIAGNOSIS — Z79899 Other long term (current) drug therapy: Secondary | ICD-10-CM | POA: Insufficient documentation

## 2023-04-17 DIAGNOSIS — Z79624 Long term (current) use of inhibitors of nucleotide synthesis: Secondary | ICD-10-CM | POA: Diagnosis not present

## 2023-04-17 DIAGNOSIS — D696 Thrombocytopenia, unspecified: Secondary | ICD-10-CM | POA: Diagnosis not present

## 2023-04-17 DIAGNOSIS — B181 Chronic viral hepatitis B without delta-agent: Secondary | ICD-10-CM | POA: Diagnosis not present

## 2023-04-17 LAB — CBC WITH DIFFERENTIAL (CANCER CENTER ONLY)
Abs Immature Granulocytes: 0.01 10*3/uL (ref 0.00–0.07)
Basophils Absolute: 0 10*3/uL (ref 0.0–0.1)
Basophils Relative: 0 %
Eosinophils Absolute: 0.1 10*3/uL (ref 0.0–0.5)
Eosinophils Relative: 1 %
HCT: 40.3 % (ref 36.0–46.0)
Hemoglobin: 13.5 g/dL (ref 12.0–15.0)
Immature Granulocytes: 0 %
Lymphocytes Relative: 13 %
Lymphs Abs: 0.7 10*3/uL (ref 0.7–4.0)
MCH: 30.6 pg (ref 26.0–34.0)
MCHC: 33.5 g/dL (ref 30.0–36.0)
MCV: 91.4 fL (ref 80.0–100.0)
Monocytes Absolute: 0.4 10*3/uL (ref 0.1–1.0)
Monocytes Relative: 7 %
Neutro Abs: 4 10*3/uL (ref 1.7–7.7)
Neutrophils Relative %: 79 %
Platelet Count: 117 10*3/uL — ABNORMAL LOW (ref 150–400)
RBC: 4.41 MIL/uL (ref 3.87–5.11)
RDW: 12.1 % (ref 11.5–15.5)
WBC Count: 5.2 10*3/uL (ref 4.0–10.5)
nRBC: 0 % (ref 0.0–0.2)

## 2023-04-17 LAB — CMP (CANCER CENTER ONLY)
ALT: 10 U/L (ref 0–44)
AST: 18 U/L (ref 15–41)
Albumin: 4.2 g/dL (ref 3.5–5.0)
Alkaline Phosphatase: 66 U/L (ref 38–126)
Anion gap: 4 — ABNORMAL LOW (ref 5–15)
BUN: 22 mg/dL (ref 8–23)
CO2: 31 mmol/L (ref 22–32)
Calcium: 9.2 mg/dL (ref 8.9–10.3)
Chloride: 108 mmol/L (ref 98–111)
Creatinine: 1.09 mg/dL — ABNORMAL HIGH (ref 0.44–1.00)
GFR, Estimated: 52 mL/min — ABNORMAL LOW (ref >60)
Glucose, Bld: 129 mg/dL — ABNORMAL HIGH (ref 70–99)
Potassium: 3.8 mmol/L (ref 3.5–5.1)
Sodium: 143 mmol/L (ref 135–145)
Total Bilirubin: 0.4 mg/dL (ref 0.0–1.2)
Total Protein: 6.6 g/dL (ref 6.5–8.1)

## 2023-04-17 LAB — LACTATE DEHYDROGENASE: LDH: 128 U/L (ref 98–192)

## 2023-04-17 NOTE — Progress Notes (Signed)
 HEMATOLOGY/ONCOLOGY CLINIC NOTE  Date of Service: 04/17/23   Patient Care Team: Clovis Riley, L.August Saucer, MD (Inactive) as PCP - General (Family Medicine)  CHIEF COMPLAINTS/PURPOSE OF CONSULTATION:  Follow-up for continued evaluation and management of low-grade follicular lymphoma  HISTORY OF PRESENTING ILLNESS:    Interval History:  [name] returns for her 14-month follow-up for continued evaluation and management of low-grade follicular lymphoma.  Patient was last seen by me on 03/21/2022 and she was doing well overall since our last visit.   Patient notes she has been doing well overall since our last visit. She denies any new infection issues, fever, chills, night sweats, unexpected weight loss, back pain, chest pain, SOB, abnormal bowel movement, or leg swelling.   She is compliant with her medication. She is still taking anti-viral medication for her Hepatitis-B and she regularly follows-up with Infectious disease.    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   Diagnosed in hands    Cancer [facility])    breast lymphoma with mets   Depression    GERD (gastroesophageal reflux disease)    Hepatitis    Hepatitis B 02/1995   Seizures (HCC)    hx of seizure in 1993 and 1994 - caused by stress per patient   -Diverticulitis  -Osteopenia  -Acute gastritis   SURGICAL HISTORY: Past Surgical History:  Procedure Laterality Date   APPENDECTOMY     COLONOSCOPY WITH PROPOFOL N/A 12/29/2015   Procedure: COLONOSCOPY WITH PROPOFOL;  Surgeon: Charolett Bumpers, MD;  Location: [facility];  Service: Endoscopy;  Laterality: N/A;   TONSILLECTOMY AND ADENOIDECTOMY Bilateral 1954   TUBAL LIGATION      SOCIAL HISTORY: Social History   Socioeconomic History   Marital status: Single    Spouse name: Not on file   Number of children: Not on file   Years of education: Not on file   Highest education level: Not on file  Occupational History    Not on file  Tobacco Use   Smoking status: Never   Smokeless tobacco: Never  Vaping Use   Vaping status: Never Used  Substance and Sexual Activity   Alcohol use: No   Drug use: No   Sexual activity: Never    Birth control/protection: None  Other Topics Concern   Not on file  Social History Narrative   Not on file   Social Drivers of Health   Financial Resource Strain: Not on file  Food Insecurity: Not on file  Transportation Needs: Not on file  Physical Activity: Not on file  Stress: Not on file  Social Connections: Not on file  Intimate Partner Violence: Not on file    FAMILY HISTORY: Family History  Problem Relation Age of Onset   Alzheimer's disease Sister    Dementia Sister    Sexual abuse Sister    Seizures Sister    Alcohol abuse Brother    Alcohol abuse Maternal Uncle    Drug abuse Cousin    Drug abuse Son    Depression Neg Hx     ALLERGIES:  is allergic to penicillins.  MEDICATIONS:  Current Outpatient Medications  Medication Sig Dispense Refill   ARIPiprazole (ABILIFY) 2 MG tablet Take 1 tablet (2 mg total) by mouth daily. 90 tablet 0   ascorbic acid (VITAMIN C) 500 MG tablet 1 tablet Orally Once a day     Calcium Carb-Cholecalciferol 600-100 MG-UNIT CAPS Take 1 tablet by mouth daily.  Cholecalciferol (VITAMIN D-400 PO) Take by mouth. 1x/ day     famotidine (PEPCID) 20 MG tablet Take 20 mg by mouth 2 (two) times daily.     ketoconazole (NIZORAL) 2 % cream 1 application to affected area Externally Once a day     Multiple Vitamin (MULTIVITAMIN WITH MINERALS) TABS tablet Take 1 tablet by mouth daily. 30 tablet 0   polyethylene glycol (MIRALAX / GLYCOLAX) 17 g packet Take 17 g by mouth daily. 14 each 0   senna-docusate (SENOKOT-S) 8.6-50 MG tablet Take 1 tablet by mouth at bedtime as needed for mild constipation. 30 tablet 0   sertraline (ZOLOFT) 100 MG tablet Take 1 tablet (100 mg total) by mouth daily. 90 tablet 0   triamcinolone cream (KENALOG) 0.1 %       VEMLIDY 25 MG TABS TAKE 1 TABLET(25 MG) BY MOUTH DAILY 30 tablet 11   No current facility-administered medications for this visit.    REVIEW OF SYSTEMS:   10 Point review of Systems was done is negative except as noted above.  PHYSICAL EXAMINATION: ECOG PERFORMANCE STATUS: 1 - Symptomatic but completely ambulatory  Vitals:   04/17/23 1329  BP: 127/60  Pulse: (!) 59  Resp: 16  Temp: 97.6 F (36.4 C)  SpO2: 98%    Filed Weights   04/17/23 1329  Weight: 165 lb 8 oz (75.1 kg)    .Body mass index is 30.27 kg/m.  NAD GENERAL:alert, in no acute distress and comfortable SKIN: no acute rashes, no significant lesions EYES: conjunctiva are pink and non-injected, sclera anicteric OROPHARYNX: MMM, no exudates, no oropharyngeal erythema or ulceration NECK: supple, no JVD LYMPH:  no palpable lymphadenopathy in the cervical, axillary or inguinal regions LUNGS: clear to auscultation b/l with normal respiratory effort HEART: regular rate & rhythm ABDOMEN:  normoactive bowel sounds , non tender, not distended. Extremity: no pedal edema PSYCH: alert & oriented x 3 with fluent speech NEURO: no focal motor/sensory deficits   LABORATORY DATA:  I have reviewed the data as listed  .    Latest Ref Rng & Units 04/17/2023   12:48 PM 03/21/2022    8:48 AM 09/20/2021    8:59 AM  CBC  WBC 4.0 - 10.5 K/uL 5.2  5.1  4.0   Hemoglobin 12.0 - 15.0 g/dL 16.1  09.6  04.5   Hematocrit 36.0 - 46.0 % 40.3  42.1  39.5   Platelets 150 - 400 K/uL 117  123  108    ANC 200 .    Latest Ref Rng & Units 04/17/2023   12:48 PM 03/21/2022    8:48 AM 09/20/2021    8:59 AM  CMP  Glucose 70 - 99 mg/dL 409  92  94   BUN 8 - 23 mg/dL 22  16  15    Creatinine 0.44 - 1.00 mg/dL 8.11  9.14  7.82   Sodium 135 - 145 mmol/L 143  143  142   Potassium 3.5 - 5.1 mmol/L 3.8  3.8  4.0   Chloride 98 - 111 mmol/L 108  105  107   CO2 22 - 32 mmol/L 31  31  32   Calcium 8.9 - 10.3 mg/dL 9.2  9.1  9.5   Total  Protein 6.5 - 8.1 g/dL 6.6  6.7  6.4   Total Bilirubin 0.0 - 1.2 mg/dL 0.4  0.6  0.5   Alkaline Phos 38 - 126 U/L 66  65  63   AST 15 - 41 U/L 18  19  17   ALT 0 - 44 U/L 10  11  10     . Lab Results  Component Value Date   LDH 128 04/17/2023    Component     Latest Ref Rng & Units 05/02/2017 06/29/2017  HBV DNA SERPL PCR-ACNC     IU/mL 290 HBV DNA not detected  HBV DNA SERPL PCR-LOG IU     log10 IU/mL 2.462 UNABLE TO CALCULATE  Test Info:      Comment Comment   Component     Latest Ref Rng & Units 07/12/2017  HBV DNA SERPL PCR-ACNC     IU/mL HBV DNA not detected  HBV DNA SERPL PCR-LOG IU     log10 IU/mL UNABLE TO CALCULATE  Test Info:      Comment   Component     Latest Ref Rng & Units 05/02/2017  HBV DNA SERPL PCR-ACNC     IU/mL 290  HBV DNA SERPL PCR-LOG IU     log10 IU/mL 2.462  Test Info:      Comment  Hep B Core Ab, IgM     Negative Negative  Hep B Core Ab, Tot     Negative Positive (A)  Hepatitis B Surface Ag     Negative Confirm. indicated  HCV Ab     0.0 - 0.9 s/co ratio <0.1  HBsAg Conf      Positive (A)    PATHOLOGY    Diagnosis 04/25/17  Lymph node, needle/core biopsy, left - FOLLICULAR LYMPHOMA, GRADE 1-2. - SEE ONCOLOGY TABLE. Microscopic Comment LYMPHOMA Histologic type: Non-Hodgkin B-cell lymphoma: Follicular lymphoma. Grade (if applicable): Low grade (grade 1-2). Flow cytometry: N/A. Immunohistochemical stains: CD20, CD3, CD5, CD10, CD23, CD43, bcl-2, bcl-6, CD138, light chains, Ki-67. Touch preps/imprints: N/A. Comments: Core biopsies reveal effacement by a vaguely nodular infiltrate of atypical lymphocytes. The lymphocytes are predominately small with irregular nuclear contours. There are no diffuse areas of large cells. Immunohistochemistry reveals abundant CD20-positive B-cells. CD3, CD43, and CD5 highlight interfollicular T-cells. Bcl-6 and CD23 reveal numerous small follicles with bcl-6 positive cells extending outside the follicular  dendritic networks. The majority of these cells are bcl-2 positive. CD10 reveals only a few small residual germinal center foci. CD138 highlights scattered plasma cells which are polytypic by light chain in situ hybridization. The overall findings are most consistent with a low grade follicular lymphoma. Dr. Yolanda Bonine was notified on 04/27/2017.   RADIOGRAPHIC STUDIES: I have personally reviewed the radiological images as listed and agreed with the findings in the report. No results found.   Screening Mammogram 04/17/17 IMPRESSION:  The New enlarged nodes in both axillae are indeterminate. An [name] is recommended.    ASSESSMENT & PLAN:   Brooke Huber is a 78 y.o. Caucasian female with   1. 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  10/18/17 PET/CT revealed No measurable metabolic activity within previously enlarged cervical, axillary, mediastinal, and abdominal and pelvic lymph nodes ( [place] 1). 2. Findings support complete response to therapy. 3. Spleen is normal size with normal metabolic activity. 4. No evidence of marrow involvement.   The pt reached complete metabolic response after three cycles of Bendamustine/Rituxan and 2 cycles of only Rituxan (due to neutropenia)  05/16/18 CT C/A/P revealed No residual or recurrent lymphadenopathy in the chest, abdomen, or pelvis. 2. Mild splenomegaly, maximum span 13.2 cm, significantly decreased in size compared to prior examinations.   2. Mild thrombocytopenia--much worse previously improved significantly when her lymphoma was treated.  Currently mild thrombocytopenia likely from tenofovir and hep B related liver  disease   3. Chronic active Hepatitis B -Hepatitis B labs from 05/02/17 revealed chronic active disease which will be a limiting factor in her treatment.  -Last hepatitis B DNA viral load suppressed on 04/23/2020 -Continues to be on tenofovir as per infectious disease Ultrasound abdomen 10/13/2020 no evidence of suspicious lesions for [facility]  PLAN: -Discussed lab results from today, 04/17/2023, in detail with the patient. CBC shows low platelets of 117 K. CMP shows slightly elevated creatinine of 1.09. LDH WNL -no clinical or lab evidence of follicular lymphoma progression at this time. --She is continue to follow with infectious disease for continued management of her hepatitis B -No indication for additional treatment of her follicular lymphoma at this time. --Answered all of patient's questions.  -RTC in one year.  -Recommend COVID-19 booster, RSV vaccine, influenza vaccine, and other age related vaccines.  FOLLOW-UP: Return to clinic with Dr. Candise Che with labs in 12 months  The total time spent in the appointment was 23 minutes* .  All of the patient's questions were answered with apparent satisfaction. The patient knows to call the clinic with any problems, questions or concerns.   Wyvonnia Lora MD MS AAHIVMS [facility] Hematology/Oncology Physician [facility]  .*Total Encounter Time as defined by the Centers for Medicare and Medicaid Services includes, in addition to the face-to-face time of a patient visit (documented in the note above) non-face-to-face time: obtaining and reviewing outside history, ordering and reviewing medications, tests or procedures, care coordination (communications with other health care professionals or caregivers) and documentation in the medical record.   I,Param Shah,acting as a Neurosurgeon for Wyvonnia Lora, MD.,have documented all relevant documentation on the behalf of Wyvonnia Lora, MD,as directed by  Wyvonnia Lora, MD while in the presence of Wyvonnia Lora,  MD.  .I have reviewed the above documentation for accuracy and completeness, and I agree with the above. Johney Maine MD

## 2023-04-18 ENCOUNTER — Encounter: Payer: Self-pay | Admitting: Internal Medicine

## 2023-04-18 ENCOUNTER — Other Ambulatory Visit: Payer: Self-pay

## 2023-04-18 ENCOUNTER — Ambulatory Visit: Payer: Medicare HMO | Admitting: Internal Medicine

## 2023-04-18 VITALS — BP 143/84 | HR 57 | Temp 97.8°F | Wt 162.0 lb

## 2023-04-18 DIAGNOSIS — Z9189 Other specified personal risk factors, not elsewhere classified: Secondary | ICD-10-CM | POA: Diagnosis not present

## 2023-04-18 DIAGNOSIS — B181 Chronic viral hepatitis B without delta-agent: Secondary | ICD-10-CM | POA: Diagnosis not present

## 2023-04-18 DIAGNOSIS — Z5181 Encounter for therapeutic drug level monitoring: Secondary | ICD-10-CM | POA: Diagnosis not present

## 2023-04-18 MED ORDER — VEMLIDY 25 MG PO TABS: 25.0000 mg | ORAL_TABLET | Freq: Every day | ORAL | 11 refills | Status: DC

## 2023-04-18 NOTE — Assessment & Plan Note (Signed)
 Reviewed previous ultrasounds with her with the last one being done in October.  No concerns for growing mass and will continue with HCC screening every 6 months.

## 2023-04-18 NOTE — Assessment & Plan Note (Signed)
 Labs checked by cancer center yesterday and creatinine stable just minimal elevation and no significant change from previous.  No changes indicated on the medication.

## 2023-04-18 NOTE — Progress Notes (Signed)
   Subjective:    Patient ID: Brooke Huber, female    DOB: 1945/03/24, 78 y.o.   MRN: [id number]  HPI Brooke Huber is here for active chronic hepatitis B. She has a history of lymphoma and had been started on suppressive tenofovir at that time for treatment then developed a flare of hepatitis B after stopping medication.  She was put back on it with a  Plan to remain on it lifelong.   He is here for her yearly follow-up and doing well.  She continues on tenofovir with no complaints.  She continues to get [facility] screening by ultrasound twice a year and no concerns, with the last one in October.  She has not missed any doses of her medication.   Review of Systems  Constitutional:  Negative for fatigue.  Gastrointestinal:  Negative for diarrhea and nausea.  Skin:  Negative for rash.       Objective:   Physical Exam Eyes:     General: No scleral icterus. Pulmonary:     Effort: Pulmonary effort is normal.  Neurological:     Mental Status: She is alert.  Psychiatric:        Mood and Affect: Mood normal.    SH: no alcohol        Assessment & Plan:

## 2023-04-18 NOTE — Assessment & Plan Note (Signed)
 He continues to do well on Vemlidy and no changes indicated.  Refills provided for the year.  She can follow-up in 1 year

## 2023-04-21 LAB — HEPATITIS B DNA, ULTRAQUANTITATIVE, PCR
Hepatitis B DNA: NOT DETECTED [IU]/mL
Hepatitis B virus DNA: NOT DETECTED {Log_IU}/mL

## 2023-04-26 ENCOUNTER — Ambulatory Visit
Admission: RE | Admit: 2023-04-26 | Discharge: 2023-04-26 | Disposition: A | Discharge status: Home / Self Care | Source: Ambulatory Visit | Attending: Internal Medicine | Admitting: Internal Medicine

## 2023-04-26 DIAGNOSIS — B181 Chronic viral hepatitis B without delta-agent: Secondary | ICD-10-CM

## 2023-04-26 DIAGNOSIS — K7689 Other specified diseases of liver: Secondary | ICD-10-CM | POA: Diagnosis not present

## 2023-05-29 ENCOUNTER — Other Ambulatory Visit (HOSPITAL_COMMUNITY): Payer: Self-pay | Admitting: Psychiatry

## 2023-05-29 DIAGNOSIS — F333 Major depressive disorder, recurrent, severe with psychotic symptoms: Secondary | ICD-10-CM

## 2023-05-29 DIAGNOSIS — F410 Panic disorder [episodic paroxysmal anxiety] without agoraphobia: Secondary | ICD-10-CM

## 2023-05-29 DIAGNOSIS — F411 Generalized anxiety disorder: Secondary | ICD-10-CM

## 2023-06-12 ENCOUNTER — Other Ambulatory Visit: Payer: Self-pay | Admitting: Internal Medicine

## 2023-06-12 DIAGNOSIS — B181 Chronic viral hepatitis B without delta-agent: Secondary | ICD-10-CM

## 2023-06-15 ENCOUNTER — Other Ambulatory Visit: Payer: Self-pay | Admitting: Internal Medicine

## 2023-06-15 DIAGNOSIS — B181 Chronic viral hepatitis B without delta-agent: Secondary | ICD-10-CM

## 2023-07-01 DIAGNOSIS — E7800 Pure hypercholesterolemia, unspecified: Secondary | ICD-10-CM | POA: Diagnosis not present

## 2023-07-13 ENCOUNTER — Encounter (HOSPITAL_COMMUNITY): Payer: Self-pay | Admitting: Psychiatry

## 2023-07-13 ENCOUNTER — Ambulatory Visit (HOSPITAL_BASED_OUTPATIENT_CLINIC_OR_DEPARTMENT_OTHER): Admitting: Psychiatry

## 2023-07-13 ENCOUNTER — Other Ambulatory Visit: Payer: Self-pay

## 2023-07-13 DIAGNOSIS — F411 Generalized anxiety disorder: Secondary | ICD-10-CM

## 2023-07-13 DIAGNOSIS — F333 Major depressive disorder, recurrent, severe with psychotic symptoms: Secondary | ICD-10-CM | POA: Diagnosis not present

## 2023-07-13 DIAGNOSIS — F410 Panic disorder [episodic paroxysmal anxiety] without agoraphobia: Secondary | ICD-10-CM | POA: Diagnosis not present

## 2023-07-13 MED ORDER — ARIPIPRAZOLE 2 MG PO TABS
2.0000 mg | ORAL_TABLET | Freq: Every day | ORAL | 1 refills | Status: AC
Start: 2023-07-13 — End: ?

## 2023-07-13 MED ORDER — SERTRALINE HCL 100 MG PO TABS: 100.0000 mg | ORAL_TABLET | Freq: Every day | ORAL | 1 refills | Status: DC

## 2023-07-13 MED ORDER — SERTRALINE HCL 100 MG PO TABS: 100.0000 mg | ORAL_TABLET | Freq: Every day | ORAL | 0 refills | Status: DC

## 2023-07-13 MED ORDER — ARIPIPRAZOLE 2 MG PO TABS: 2.0000 mg | ORAL_TABLET | Freq: Every day | ORAL | 0 refills | Status: DC
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
## 2023-07-24 DIAGNOSIS — H2513 Age-related nuclear cataract, bilateral: Secondary | ICD-10-CM | POA: Diagnosis not present

## 2023-07-24 DIAGNOSIS — H40033 Anatomical narrow angle, bilateral: Secondary | ICD-10-CM | POA: Diagnosis not present

## 2023-07-31 DIAGNOSIS — F411 Generalized anxiety disorder: Secondary | ICD-10-CM | POA: Diagnosis not present

## 2023-07-31 DIAGNOSIS — E559 Vitamin D deficiency, unspecified: Secondary | ICD-10-CM | POA: Diagnosis not present

## 2023-07-31 DIAGNOSIS — E669 Obesity, unspecified: Secondary | ICD-10-CM | POA: Diagnosis not present

## 2023-07-31 DIAGNOSIS — K219 Gastro-esophageal reflux disease without esophagitis: Secondary | ICD-10-CM | POA: Diagnosis not present

## 2023-07-31 DIAGNOSIS — M1990 Unspecified osteoarthritis, unspecified site: Secondary | ICD-10-CM | POA: Diagnosis not present

## 2023-07-31 DIAGNOSIS — B181 Chronic viral hepatitis B without delta-agent: Secondary | ICD-10-CM | POA: Diagnosis not present

## 2023-07-31 DIAGNOSIS — Z8572 Personal history of non-Hodgkin lymphomas: Secondary | ICD-10-CM | POA: Diagnosis not present

## 2023-07-31 DIAGNOSIS — F3341 Major depressive disorder, recurrent, in partial remission: Secondary | ICD-10-CM | POA: Diagnosis not present

## 2023-07-31 DIAGNOSIS — E7800 Pure hypercholesterolemia, unspecified: Secondary | ICD-10-CM | POA: Diagnosis not present

## 2023-07-31 DIAGNOSIS — F419 Anxiety disorder, unspecified: Secondary | ICD-10-CM | POA: Diagnosis not present

## 2023-08-22 DIAGNOSIS — Z1231 Encounter for screening mammogram for malignant neoplasm of breast: Secondary | ICD-10-CM | POA: Diagnosis not present

## 2023-08-24 DIAGNOSIS — E782 Mixed hyperlipidemia: Secondary | ICD-10-CM | POA: Diagnosis not present

## 2023-08-24 DIAGNOSIS — C8218 Follicular lymphoma grade II, lymph nodes of multiple sites: Secondary | ICD-10-CM | POA: Diagnosis not present

## 2023-08-24 DIAGNOSIS — M8588 Other specified disorders of bone density and structure, other site: Secondary | ICD-10-CM | POA: Diagnosis not present

## 2023-08-24 DIAGNOSIS — Z131 Encounter for screening for diabetes mellitus: Secondary | ICD-10-CM | POA: Diagnosis not present

## 2023-08-24 DIAGNOSIS — Z1331 Encounter for screening for depression: Secondary | ICD-10-CM | POA: Diagnosis not present

## 2023-08-24 DIAGNOSIS — K5900 Constipation, unspecified: Secondary | ICD-10-CM | POA: Diagnosis not present

## 2023-08-24 DIAGNOSIS — D696 Thrombocytopenia, unspecified: Secondary | ICD-10-CM | POA: Diagnosis not present

## 2023-08-24 DIAGNOSIS — Z0000 Encounter for general adult medical examination without abnormal findings: Secondary | ICD-10-CM | POA: Diagnosis not present

## 2023-08-24 DIAGNOSIS — L309 Dermatitis, unspecified: Secondary | ICD-10-CM | POA: Diagnosis not present

## 2023-08-24 DIAGNOSIS — F331 Major depressive disorder, recurrent, moderate: Secondary | ICD-10-CM | POA: Diagnosis not present

## 2023-08-24 DIAGNOSIS — B354 Tinea corporis: Secondary | ICD-10-CM | POA: Diagnosis not present

## 2023-08-24 DIAGNOSIS — B181 Chronic viral hepatitis B without delta-agent: Secondary | ICD-10-CM | POA: Diagnosis not present

## 2023-08-31 DIAGNOSIS — E7800 Pure hypercholesterolemia, unspecified: Secondary | ICD-10-CM | POA: Diagnosis not present

## 2023-09-04 DIAGNOSIS — E782 Mixed hyperlipidemia: Secondary | ICD-10-CM | POA: Diagnosis not present

## 2023-10-01 DIAGNOSIS — E7800 Pure hypercholesterolemia, unspecified: Secondary | ICD-10-CM | POA: Diagnosis not present

## 2023-10-04 DIAGNOSIS — L304 Erythema intertrigo: Secondary | ICD-10-CM | POA: Diagnosis not present

## 2023-10-31 DIAGNOSIS — E7800 Pure hypercholesterolemia, unspecified: Secondary | ICD-10-CM | POA: Diagnosis not present

## 2023-11-14 ENCOUNTER — Other Ambulatory Visit (HOSPITAL_COMMUNITY): Payer: Self-pay

## 2023-11-27 ENCOUNTER — Ambulatory Visit
Admission: RE | Admit: 2023-11-27 | Discharge: 2023-11-27 | Disposition: A | Source: Ambulatory Visit | Attending: Internal Medicine | Admitting: Internal Medicine

## 2023-11-27 DIAGNOSIS — B181 Chronic viral hepatitis B without delta-agent: Secondary | ICD-10-CM

## 2023-11-27 DIAGNOSIS — K7689 Other specified diseases of liver: Secondary | ICD-10-CM | POA: Diagnosis not present

## 2023-12-01 DIAGNOSIS — E7800 Pure hypercholesterolemia, unspecified: Secondary | ICD-10-CM | POA: Diagnosis not present

## 2023-12-31 DIAGNOSIS — E7800 Pure hypercholesterolemia, unspecified: Secondary | ICD-10-CM | POA: Diagnosis not present

## 2024-01-18 ENCOUNTER — Ambulatory Visit (HOSPITAL_COMMUNITY): Admitting: Psychiatry

## 2024-01-18 ENCOUNTER — Other Ambulatory Visit: Payer: Self-pay

## 2024-01-18 VITALS — BP 143/83 | HR 65 | Ht 62.0 in | Wt 157.0 lb

## 2024-01-18 DIAGNOSIS — F411 Generalized anxiety disorder: Secondary | ICD-10-CM | POA: Diagnosis not present

## 2024-01-18 DIAGNOSIS — F410 Panic disorder [episodic paroxysmal anxiety] without agoraphobia: Secondary | ICD-10-CM | POA: Diagnosis not present

## 2024-01-18 DIAGNOSIS — F333 Major depressive disorder, recurrent, severe with psychotic symptoms: Secondary | ICD-10-CM

## 2024-01-18 MED ORDER — ARIPIPRAZOLE 2 MG PO TABS: 2.0000 mg | ORAL_TABLET | Freq: Every day | ORAL | 1 refills | Status: AC

## 2024-01-18 MED ORDER — SERTRALINE HCL 100 MG PO TABS: 100.0000 mg | ORAL_TABLET | Freq: Every day | ORAL | 1 refills | Status: AC
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
## 2024-01-19 ENCOUNTER — Other Ambulatory Visit (HOSPITAL_COMMUNITY): Payer: Self-pay

## 2024-02-05 ENCOUNTER — Other Ambulatory Visit (HOSPITAL_COMMUNITY): Payer: Self-pay

## 2024-02-19 ENCOUNTER — Other Ambulatory Visit (HOSPITAL_COMMUNITY): Payer: Self-pay

## 2024-03-05 ENCOUNTER — Other Ambulatory Visit: Payer: Self-pay | Admitting: Internal Medicine

## 2024-03-05 DIAGNOSIS — B181 Chronic viral hepatitis B without delta-agent: Secondary | ICD-10-CM

## 2024-04-09 ENCOUNTER — Ambulatory Visit: Admitting: Internal Medicine

## 2024-04-17 ENCOUNTER — Other Ambulatory Visit

## 2024-04-17 ENCOUNTER — Ambulatory Visit: Admitting: Hematology

## 2024-07-18 ENCOUNTER — Ambulatory Visit (HOSPITAL_COMMUNITY): Admitting: Psychiatry
